# Patient Record
Sex: Male | Born: 1955 | Race: Black or African American | Hispanic: No | Marital: Married | Smoking: Never smoker
Health system: Southern US, Community
[De-identification: ages and names within clinical notes are randomized; demographics above are authoritative.]

## PROBLEM LIST (undated history)

## (undated) DIAGNOSIS — E785 Hyperlipidemia, unspecified: Secondary | ICD-10-CM

## (undated) DIAGNOSIS — R931 Abnormal findings on diagnostic imaging of heart and coronary circulation: Secondary | ICD-10-CM

## (undated) DIAGNOSIS — R945 Abnormal results of liver function studies: Secondary | ICD-10-CM

## (undated) DIAGNOSIS — I10 Essential (primary) hypertension: Secondary | ICD-10-CM

## (undated) DIAGNOSIS — R7989 Other specified abnormal findings of blood chemistry: Secondary | ICD-10-CM

## (undated) DIAGNOSIS — E669 Obesity, unspecified: Secondary | ICD-10-CM

## (undated) DIAGNOSIS — I480 Paroxysmal atrial fibrillation: Secondary | ICD-10-CM

## (undated) DIAGNOSIS — R251 Tremor, unspecified: Secondary | ICD-10-CM

## (undated) DIAGNOSIS — I2510 Atherosclerotic heart disease of native coronary artery without angina pectoris: Secondary | ICD-10-CM

## (undated) DIAGNOSIS — R2689 Other abnormalities of gait and mobility: Secondary | ICD-10-CM

## (undated) DIAGNOSIS — R6889 Other general symptoms and signs: Secondary | ICD-10-CM

## (undated) DIAGNOSIS — R0789 Other chest pain: Secondary | ICD-10-CM

## (undated) DIAGNOSIS — G4733 Obstructive sleep apnea (adult) (pediatric): Secondary | ICD-10-CM

## (undated) HISTORY — DX: Other general symptoms and signs: R68.89

## (undated) HISTORY — DX: Hyperlipidemia, unspecified: E78.5

## (undated) HISTORY — DX: Tremor, unspecified: R25.1

## (undated) HISTORY — DX: Other chest pain: R07.89

## (undated) HISTORY — DX: Other specified abnormal findings of blood chemistry: R79.89

## (undated) HISTORY — DX: Essential (primary) hypertension: I10

## (undated) HISTORY — DX: Other abnormalities of gait and mobility: R26.89

## (undated) HISTORY — DX: Abnormal results of liver function studies: R94.5

## (undated) HISTORY — DX: Obstructive sleep apnea (adult) (pediatric): G47.33

## (undated) HISTORY — DX: Abnormal findings on diagnostic imaging of heart and coronary circulation: R93.1

## (undated) HISTORY — DX: Atherosclerotic heart disease of native coronary artery without angina pectoris: I25.10

## (undated) HISTORY — DX: Paroxysmal atrial fibrillation: I48.0

## (undated) HISTORY — DX: Obesity, unspecified: E66.9

---

## 1999-05-04 ENCOUNTER — Ambulatory Visit (HOSPITAL_COMMUNITY): Admission: RE | Admit: 1999-05-04 | Discharge: 1999-05-04 | Payer: Self-pay | Admitting: Family Medicine

## 2003-12-20 ENCOUNTER — Encounter: Admission: RE | Admit: 2003-12-20 | Discharge: 2003-12-20 | Payer: Self-pay | Admitting: Family Medicine

## 2004-11-12 DIAGNOSIS — R931 Abnormal findings on diagnostic imaging of heart and coronary circulation: Secondary | ICD-10-CM

## 2004-11-12 HISTORY — DX: Abnormal findings on diagnostic imaging of heart and coronary circulation: R93.1

## 2005-02-13 ENCOUNTER — Ambulatory Visit (HOSPITAL_COMMUNITY): Admission: RE | Admit: 2005-02-13 | Discharge: 2005-02-13 | Payer: Self-pay | Admitting: Internal Medicine

## 2005-02-14 ENCOUNTER — Ambulatory Visit: Payer: Self-pay | Admitting: Cardiology

## 2005-02-22 ENCOUNTER — Ambulatory Visit: Payer: Self-pay

## 2005-03-05 ENCOUNTER — Ambulatory Visit: Payer: Self-pay | Admitting: Cardiology

## 2005-04-06 ENCOUNTER — Ambulatory Visit: Payer: Self-pay | Admitting: Cardiology

## 2005-04-12 ENCOUNTER — Ambulatory Visit: Payer: Self-pay

## 2005-05-07 ENCOUNTER — Encounter: Admission: RE | Admit: 2005-05-07 | Discharge: 2005-05-07 | Payer: Self-pay | Admitting: Internal Medicine

## 2005-07-19 ENCOUNTER — Ambulatory Visit: Payer: Self-pay | Admitting: Cardiology

## 2005-10-12 ENCOUNTER — Ambulatory Visit: Payer: Self-pay | Admitting: Cardiovascular Disease

## 2005-10-16 ENCOUNTER — Ambulatory Visit: Payer: Self-pay | Admitting: Cardiovascular Disease

## 2005-10-16 ENCOUNTER — Ambulatory Visit (HOSPITAL_COMMUNITY): Admission: RE | Admit: 2005-10-16 | Discharge: 2005-10-16 | Payer: Self-pay | Admitting: Cardiovascular Disease

## 2005-10-26 ENCOUNTER — Ambulatory Visit: Payer: Self-pay | Admitting: Cardiology

## 2007-11-13 HISTORY — PX: ROTATOR CUFF REPAIR: SHX139

## 2008-03-01 ENCOUNTER — Ambulatory Visit: Payer: Self-pay | Admitting: Gastroenterology

## 2008-03-12 ENCOUNTER — Ambulatory Visit: Payer: Self-pay | Admitting: Gastroenterology

## 2008-06-15 ENCOUNTER — Inpatient Hospital Stay (HOSPITAL_COMMUNITY): Admission: EM | Admit: 2008-06-15 | Discharge: 2008-06-17 | Payer: Self-pay | Admitting: Pulmonary Disease

## 2008-06-15 ENCOUNTER — Encounter: Payer: Self-pay | Admitting: Orthopedic Surgery

## 2008-06-15 ENCOUNTER — Ambulatory Visit: Payer: Self-pay | Admitting: Pulmonary Disease

## 2010-12-03 ENCOUNTER — Encounter: Payer: Self-pay | Admitting: Internal Medicine

## 2011-03-27 NOTE — Op Note (Signed)
NAME:  FRANKIE, SCIPIO                  ACCOUNT NO.:  [id number]   MEDICAL RECORD NO.:  [id number]          PATIENT TYPE:  AMB   LOCATION:  DSC                          FACILITY:  [facility]   PHYSICIAN:  Katy Fitch. Sypher, M.D. DATE OF BIRTH:  1955-12-11   DATE OF PROCEDURE:  06/15/2008  DATE OF DISCHARGE:                               OPERATIVE REPORT   PREOPERATIVE DIAGNOSES:  Chronic stage III impingement right shoulder  with large retracted rotator cuff tear involving the entire  supraspinatus, infraspinatus tendons noted on preoperative MRI as well  as biceps tendinopathy and very unfavorable acromioclavicular anatomy at  the acromioclavicular joint and anterior acromion.   POSTOPERATIVE DIAGNOSES:  Chronic stage III impingement right shoulder  with large retracted rotator cuff tear involving the entire  supraspinatus, infraspinatus tendons noted on preoperative MRI as well  as biceps tendinopathy and very unfavorable acromioclavicular anatomy at  the acromioclavicular joint and anterior acromion, confirming a 30%  biceps degenerative tear, very unfavorable acromial and  acromioclavicular anatomy with a large inferior projecting osteophyte at  the distal clavicle and medial acromion, a retracted rotator cuff tear  involving the entire supraspinatus, infraspinatus tendons, and minor  labral degenerative changes.   OPERATION:  1. Diagnostic arthroscopy, right glenohumeral joint.  2. Arthroscopic debridement of labrum 30% biceps tendon degenerative      tear.  3. Arthroscopic subacromial decompression with bursectomy,      coracoacromial ligament debridement, and anterior acromioplasty.  4. Arthroscopic distal clavicle resection.  5. Arthroscopic repair of 2 tendon rotator cuff tear with placement of      a Bio-Corkscrew anchor anteriorly with 2 mattress sutures just      posterior to the biceps tendon and a speed bridge repairing the      supraspinatus and infraspinatus utilizing a  total of 4 swivel      locks.   OPERATIONS:  Katy Fitch. Sypher, MD   ASSISTANT:  Marveen Reeks. Dasnoit, PA-C.   ANESTHESIA:  General by endotracheal technique supplemented by right  interscalene block.   SUPERVISING ANESTHESIOLOGIST:  Bedelia Person, MD   INDICATIONS:  Raymundo Rout is a 55 year old gentleman employed at [employer] as a Geophysicist/field seismologist.  His work involves repetitively handling  very large and heavy packages.   He presented for evaluation of a painful right shoulder upon referral by  his primary care physician, Dr. Elmore Guise.   Mr. Ohlin was noted to have background medical problems including a  history of hypertension, type 2 diabetes, and had recently been  evaluated by Dr. Eden Emms of [facility] for possible coronary  artery disease.   He had undergone a coronary artery angiogram performed by Dr. Eden Emms on  October 16, 2005.  At that time, he was noted to have a normal ejection  fraction of 80%, a right coronary artery dominant vascular system, and a  normal left main coronary artery.  He subsequently has seen Dr. Chilton Si  for preoperative screening and was advised as a reasonable candidate to  consider reconstruction of his rotator cuff.   Preoperatively, he was interviewed  by Dr. Gypsy Balsam and had a screening EKG  that was interpreted to be normal without evidence of left ventricular  hypertrophy and lab studies that were normal with the exception of an  elevated blood glucose.  His cutaneous blood glucose on the morning of  surgery was 235.  His hemoglobin was 14.7.   After detailed informed consent, he was brought to the operating room at  this time anticipating arthroscopic evaluation of his glenohumeral  joint, appropriate debridement, subacromial decompression, distal  clavicle resection either open or arthroscopic repair of the rotator  cuff.  Preoperatively questions invited and answered in detail.   He was advised that he would be out of work a minimum of  6 months as a  Geophysicist/field seismologist and likely as long as 9 months due to the highly repetitive and  very vigorous nature of his job.  He understands he will be in a sling  for minimum of 6 weeks performing very limited range of motion exercises  initially followed by full range of motion exercises without load-  bearing and will require strength training from month 3 through month 9.   After informed consent, he was brought to the operating room at this  time.   PROCEDURE:  Isacc Turney was brought to the operating room and placed in  supine position on the operating table.   Dr. Gypsy Balsam provided an anesthesia consult in the holding area and placed  an interscalene block without complication.  Mr. Holsomback was brought to  room #6, placed in supine position upon the operating table, and under  Dr. Burnett Corrente supervision general endotracheal anesthesia induced.  He was  carefully positioned in the beach-chair position where they have a torso  and head holder designed for shoulder arthroscopy.   Mr. Panther is a very large and muscular man 6-feet tall weighing to 265  pounds.  His weight is primarily muscle.   His right upper extremity and forequarter were prepped with DuraPrep and  draped with impervious arthroscopy drapes.  Landmarks were identified  followed by placing of the scope with an anterior approach, placing a  switching stick through a standard posterior portal.   Diagnostic arthroscopy of the glenohumeral joint revealed minor  degenerative change of the labrum and a 30% tear of the biceps at the  rotator interval.  The subscapularis was intact.  The supraspinatus,  infraspinatus tendons were completely released from the greater  tuberosity and a large osteophyte had formed at the greater tuberosity.  The teres minor was intact.   A lateral portal was created and a suction shaver was used to debride  the biceps and the soft tissues at the greater tuberosity.  An anterior  portal was created  and used to complete the biceps debridement and  labral debridement.   The greater tuberosity was decorticated with a suction shaver brought in  laterally and care was taken to remove the osteophyte that had grown  from chronic impingement.  After completion of the preparation of the  greater tuberosity, we then removed the scope from glenohumeral joint  and placed the scope in the subacromial space.  There was florid  bursitis.  After extensive bursectomy, the coracoacromial ligament was  released with a cutting cautery followed by leveling the acromion to a  type 1 morphology.  The distal 15 mm clavicle was removed  arthroscopically.  We then performed a rather thorough anterior lateral  and posterior bursectomy to facilitate arthroscopic repair of the cuff.   We  were able to reduce the cuff to the anatomic footprint.  Therefore,  we elected to proceed with placement of 3 Bio-Corkscrew anchors, one  just behind the biceps with two #2 FiberWire sutures and two swivel  locks at the medial footprint of the supraspinatus and infraspinatus to  accomplish a speed bridge type repair.   With a moderate degree of difficulty due to Mr. Hillyard large and  muscular frame, a series of cannulated portals were placed followed by  routine arthroscopic technique placement of two mattress sutures in the  anterior supraspinatus, leaving one set of sutures for later over-the-  top technique with a swivel lock and placement of mattress sutures of  fiber tape through the supraspinatus, infraspinatus approximately 12 to  14 mm from the margin of the tendon.   With the abduction of the shoulder, we were able to then tension these.   Due to some swelling due to saline infiltration during the procedure, we  began to reach the limit of our arthroscopic portals, rendering suture  passage a bit more challenging.   Ultimately, we used the cautery to clear bursa for the lateral cortex  and we began the  process of placing swivel locks laterally to complete  the repair.   At this point, we encountered technical difficulty where bursa had  entrapped one of the swivel lock fiber tape sutures and had knotted.   After about 30 minutes of effort trying to unravel this foul, I elected  to proceed with a muscle-splitting incision to clear the fouled sutures  and completed the repair with two lateral swivel locks with speed bridge  technique using near-far far-near suture technique.   The anterior mattress suture was subsequently tied to the anchor suture  of the anterior swivel lock creating an over-the-top insetting repair of  the supraspinatus.   An anatomic repair was achieved with a very satisfactory profile.   After hemostasis was achieved, the muscle split was repaired with simple  suture of 0 Vicryl and a corner suture of #2 FiberWire.  There were no  apparent complications other than our predicament of a fouled suture.   The wound was repaired with subdermal sutures of 0 Vicryl and  intradermal 3-0 Prolene with Steri-Strips.  The portals repaired with 3-  0 Prolene.  Mr. Staiger was awakened from general anesthesia and  transferred to recovery room in stable signs.   We will monitor his O2 saturation and encourage immediate ambulation in  a sling.   We anticipate discharge in 24 hours.   Should he show any signs of water retention due to a prolonged  arthroscopic procedure, we will consider use of a diuretic.     Katy Fitch Sypher, M.D.  Electronically Signed    RVS/MEDQ  D:  06/15/2008  T:  06/16/2008  Job:  [id number]   cc:   Erskine Speed, M.D.

## 2011-03-30 NOTE — Discharge Summary (Signed)
NAME:  Edwin Gallagher, Edwin Gallagher                  ACCOUNT NO.:  [id number]   MEDICAL RECORD NO.:  [id number]          PATIENT TYPE:  INP   LOCATION:  5009                         FACILITY:  [facility]   PHYSICIAN:  Katy Fitch. Sypher, M.D. DATE OF BIRTH:  03-Feb-1956   DATE OF ADMISSION:  06/15/2008  DATE OF DISCHARGE:  06/17/2008                               DISCHARGE SUMMARY   ADMISSION DIAGNOSES:  1. Status post right shoulder arthroscopy with subacromial      decompression, distal clavicle resection, and rotator cuff repair.  2. Significant postoperative atelectasis.  3. Phrenic nerve block.  4. Diabetes.  5. Benign prostatic hypertrophy.   DISCHARGE DIAGNOSES:  1. Status post right shoulder arthroscopy with subacromial      decompression, distal clavicle resection, and rotator cuff repair.  2. Significant postoperative atelectasis.  3. Phrenic nerve block.  4. Diabetes.  5. Benign prostatic hypertrophy.   OPERATION PERFORMED:  Right shoulder arthroscopy with subacromial  decompression, distal clavicle resection, and lengthy extended rotator  cuff reconstruction.   CONSULTATION:  Coralyn Helling, MD   HISTORY:  The patient is a 55 year old male who is status post prolonged  anesthesia greater than 3 hours for right shoulder arthroscopy for  significant rotator cuff tear.  He underwent reconstruction procedure on  June 15, 2008.  Preoperatively, he had a right shoulder interscalene  block.  At the time of extubation following this procedure, he had  mucous expectorant coughed up, and this was suctioned.  His O2 sat was  88% on 2 L mask.  He was then placed on a rebreather at 100%, and this  increased to 93-95%.  Narcan was provided, and his O2 sat increased to  between 99% and 100% on 100% O2.  Chest auscultation revealed normal  left fields.  He had decreasing air movements on the right lung fields  especially posteroinferior.  We obtained a chest x-ray, AP in a seated  position.  This revealed  a high right hemidiaphragm with significant  atelectasis of the right lower lung fields.  This was a combination of  both position and his phrenic nerve block.  Dr. Karin Golden, the radiologist,  read the chest x-ray, could not rule out aspiration on single image.  Dr. Teressa Senter conferred with Dr. Judie Petit from the Anesthesia  Department.  Consensus was that they felt that he needs to be monitored  in a ICU/critical care unit.  This was to observe his O2 sats and  pulmonary status.  [facility] was contacted, and the patient was decided to  be transferred from [facility] over to [facility].  Orders for transfer written, and Mr.  Lesiak was more arousable and oriented.  He was given 10 mg of IV Lasix  and had voided approximately 1000 mL.  The patient was seen by Dr.  Coralyn Helling.  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  He felt that it would be reversible  within the next 24 hours.  The patient was started on  BiPAP, and a  followup chest x-ray was ordered.  Later that evening, the patient was  alert with his BiPAP.  O2 sats were 100% on 100% O2.  Respiratory  distress had substantially decreased.  There was no shoulder pain as his  block was still in effect.  The following morning of June 16, 2008, the  patient was sleeping comfortably with his BiPAP mask.  O2 had been  weaned to 40%, and his O2 sats were maintained at approximately 96%.  The patient was voiding spontaneously.  His potassium at this time was  3.4, magnesium 1.6, and hemoglobin of 12.5.  On exam, he had slightly  decreased breath sounds on the right side.  Chest x-ray portable in his  room showed no pneumothorax.  His right diaphragm remained up with some  atelectasis.  He was taking sips of water without difficulty.  At this  time, he showed significant interval improvement.  Critical care  consultants continued to wean  the BiPAP.  His BiPAP was then changed to  p.r.n.  The patient was then started on modified carbohydrate diet for  his type 2 diabetes.  Later on the evening of June 16, 2008, he became  more alert.  His O2 was changed to nasal cannula, and his O2 sats  remained in the high 90s.  There was no respiratory distress.  He was  eating without difficulty.  He was moving air much better on the right  side with normal breath sounds with a few crackles.  The patient was out  of bed without difficulty.  On June 17, 2008, his second day postop, we  felt that his right hemidiaphragm paralysis and subsequent respiratory  distress were resolving, and the patient was much improved overall with  improving right lower lobe aeration.  His atelectasis had near  completely resolved on his chest x-ray.  It was felt that the patient  was ready for transfer to ortho floor.  The patient continued to improve  throughout the day, and late in the afternoon of June 17, 2008, it was  felt that he was ready for discharge, as he was alert with stable vital  signs.  He was afebrile.  He had no distress and had maintained his O2  sats on room air.  His chest x-ray revealed his right hemidiaphragm was  now normal with minimal atelectasis.   ASSESSMENT:  He was now stable postoperatively following right shoulder  reconstruction with preoperative block and subsequent right  hemidiaphragm paresis.   MEDICATIONS:  The patient will continue his home meds as usual.  He is  given prescriptions for:  1. Keflex 500 mg, #12, 1 p.o. q.8 h. until gone.  2. Dilaudid 2 mg 1 or 2 p.o. q.4-6 h. p.r.n. pain.  3. Motrin 600 mg 1 p.o. q.6 h. p.r.n. pain to be taken with food.   DIET:  He will remain on his diabetic-restricted carbohydrate diet.  He  will add dietary potassium.   WOUND CARE:  He will keep his dressings dry.   ACTIVITY:  He is instructed on pendulum exercises per protocol.  He will  wear his sling full time.    RETURN APPOINTMENT:  He will return to our office in 2 days for recheck  or sooner should he have any problems.   CONDITION ON DISCHARGE:  Stable and improving.   FINAL DIAGNOSES:  Status post right shoulder rotator cuff reconstruction  with preoperative shoulder block and subsequent respiratory distress due  to right hemidiaphragm paresis and development of respiratory distress  and atelectasis, requiring transfer to Critical Care at [facility].      Jonni Sanger, P.A.      Katy Fitch Sypher, M.D.  Electronically Signed    RJD/MEDQ  D:  08/19/2008  T:  08/19/2008  Job:  [id number]

## 2011-05-03 ENCOUNTER — Encounter: Payer: Self-pay | Admitting: Cardiology

## 2011-05-03 ENCOUNTER — Ambulatory Visit (INDEPENDENT_AMBULATORY_CARE_PROVIDER_SITE_OTHER): Payer: BC Managed Care – PPO | Admitting: Cardiology

## 2011-05-03 DIAGNOSIS — E669 Obesity, unspecified: Secondary | ICD-10-CM

## 2011-05-03 DIAGNOSIS — E785 Hyperlipidemia, unspecified: Secondary | ICD-10-CM

## 2011-05-03 DIAGNOSIS — I10 Essential (primary) hypertension: Secondary | ICD-10-CM

## 2011-05-03 DIAGNOSIS — R079 Chest pain, unspecified: Secondary | ICD-10-CM

## 2011-05-03 DIAGNOSIS — R072 Precordial pain: Secondary | ICD-10-CM

## 2011-05-03 NOTE — Patient Instructions (Signed)
Your physician recommends that you schedule a follow-up appointment in:2-3 weeks with Dr. Riley Kill. Your physician has requested that you have a stress echocardiogram. For further information please visit [email or web address] Please follow instruction sheet as given. A chest x-ray takes a picture of the organs and structures inside the chest, including the heart, lungs, and blood vessels. This test can show several things, including, whether the heart is enlarges; whether fluid is building up in the lungs; and whether pacemaker / defibrillator leads are still in place. To be done at our [facility]. Office across from [facility].

## 2011-05-08 ENCOUNTER — Ambulatory Visit (INDEPENDENT_AMBULATORY_CARE_PROVIDER_SITE_OTHER)
Admission: RE | Admit: 2011-05-08 | Discharge: 2011-05-08 | Disposition: A | Discharge status: Home / Self Care | Payer: BC Managed Care – PPO | Source: Ambulatory Visit | Attending: Cardiology | Admitting: Cardiology

## 2011-05-08 DIAGNOSIS — R072 Precordial pain: Secondary | ICD-10-CM

## 2011-05-10 ENCOUNTER — Ambulatory Visit (HOSPITAL_BASED_OUTPATIENT_CLINIC_OR_DEPARTMENT_OTHER): Payer: BC Managed Care – PPO | Admitting: Radiology

## 2011-05-10 ENCOUNTER — Ambulatory Visit (HOSPITAL_COMMUNITY): Payer: BC Managed Care – PPO | Attending: Cardiology

## 2011-05-10 DIAGNOSIS — R0989 Other specified symptoms and signs involving the circulatory and respiratory systems: Secondary | ICD-10-CM

## 2011-05-10 DIAGNOSIS — R072 Precordial pain: Secondary | ICD-10-CM

## 2011-05-10 DIAGNOSIS — I10 Essential (primary) hypertension: Secondary | ICD-10-CM | POA: Insufficient documentation

## 2011-05-11 ENCOUNTER — Encounter: Payer: Self-pay | Admitting: Cardiology

## 2011-05-11 DIAGNOSIS — E785 Hyperlipidemia, unspecified: Secondary | ICD-10-CM | POA: Insufficient documentation

## 2011-05-11 DIAGNOSIS — E669 Obesity, unspecified: Secondary | ICD-10-CM | POA: Insufficient documentation

## 2011-05-11 DIAGNOSIS — I10 Essential (primary) hypertension: Secondary | ICD-10-CM | POA: Insufficient documentation

## 2011-05-11 DIAGNOSIS — R079 Chest pain, unspecified: Secondary | ICD-10-CM | POA: Insufficient documentation

## 2011-05-11 NOTE — Assessment & Plan Note (Signed)
Patient has had some intermittent chest pain of uncertain etiology.  He has had long standing hypertension, and had a CT scan in 2006 with some soft plaque, but negative calcium score.  We may need a low threshold for perform definitive cath in him, but would suggest getting stress echo first, seeing those results, and getting a CXR to exclude other sources.  There are some typical but mostly atypical features of his symptoms.  It does impact his work.  We will proceed in the next week with getting these done.  There are no acute ECG changes at present.

## 2011-05-11 NOTE — Assessment & Plan Note (Signed)
On a multidrug regimen prescribed by Dr. Chilton Si.  Appears to have pretty good control.  Major factor is weight, perhaps OSA.  Consideration of OSA evaluation might be worthwhile.

## 2011-05-11 NOTE — Progress Notes (Signed)
HPI:  [name] is seen as an add on from Dr. Thomasene Lot office.  He was seen by Dr. Chilton Si on June 7, with a complaint of chest pain which had occurred over a two week period, with three episodes, seemingly worse when he layed down.  He had had an abnormal stress test in 2006, and ultimately underwent a cardiac CT scan which was felt not to show a significant issue at that time.  Results are noted.  It seems to occur mostly at work.  He will sweat at times, but not necessarily related to these episodes.  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  The pain will last up to 10-20 minutes, sometimes with radiation to arm.  His adenosine study in 2006 was without ischemia, but lower EF.  Echo showed moderate concentric hypertrophy.   His Cardiac CT of October 16, 2005 is read as:   Findings:  The patient's calcium score was 0.  There was no evidence   of calcific plaque anywhere in the coronary arteries.   The patient had normal left ventricular cavity size.  There was   moderate basal septal hypertrophy with a septal thickness of about 15   mm.  Ejection fraction was normal at 80%, and there were no regional   wall motion abnormalities.   The coronary arteries arose normally from the respective sinuses.   The patient was right coronary artery dominant.   The left main coronary artery was normal.   The left anterior descending artery was normal in its proximal   portion.   LAD had a somewhat eccentric soft plaque with a less than 50%   stenosis.  This occurred after the takeoff of a medium-size first   diagonal branch.  The diagonal branch was normal.  The distal LAD was   normal.  Care was taken to see if there was any bridging in the area   of the mid LAD lesion, and there was not.  However, this did not   appear to be a flow-limiting lesion.   Circumflex coronary artery was small and consisted primarily of 1 AV   groove or OM branch and was  normal.   The right coronary artery was large and dominant and was somewhat   tortuous, as is sometimes seen in hypertensive patients.  There is a   single PDA and posterolateral branch.  There is no significant   disease in the right coronary artery.   There is an accessory right pulmonary vein to the superior segment   right lower lobe, seen on image 15 of series 10.   View of the patient's bone, mediastinal, and lung windows showed no   significant lesions.  The noncardiac findings will be also   re-reviewed by Dr. Jeronimo Greaves from [facility].   IMPRESSION:   1.  Calcium score equal to 0.   2.  Normal LV function with no wall motion abnormality.  EF 80%.   Mild to moderate basal septal hypertrophy with a septal thickness of   15 mm in diastole.   3.  Normal left main, right coronary, and circumflex coronary   arteries.  One area of less than 50% soft plaque in the mid LAD.   4.  Normal trileaflet aortic valve without significant lesions in the   ascending aorta.   5.  Normal left pulmonary veins with an accessory right sided vein   supplying the superoir  segment, right lower lobe.   6.  No left atrial appendage thrombus.   7.  Normal pericardium with no evidence of pericardial effusion.   8.  Overall, this would be considered low-risk cardiac CTA.   This study should be billed by [facility] as 0147 T-code plus   051 T-code, done in conjunction with Dr. Jeronimo Greaves from [facility]   [facility].    Read By:  Consuello Bossier,  M.D.  Wendall Stade,  M.D.   Current Outpatient Prescriptions  Medication Sig Dispense Refill  . amLODipine (NORVASC) 10 MG tablet Take 10 mg by mouth daily.        Marland Kitchen aspirin 81 MG tablet Take 81 mg by mouth daily.        Marland Kitchen glimepiride (AMARYL) 4 MG tablet Take 4 mg by mouth 2 (two) times daily.        Marland Kitchen losartan-hydrochlorothiazide (HYZAAR) 100-25 MG per tablet Take 1 tablet by mouth daily.        . metFORMIN (GLUCOPHAGE) 1000 MG tablet  Take 1,000 mg by mouth. 1 in AM and 1/2 PM       . metoprolol (TOPROL-XL) 100 MG 24 hr tablet Take 100 mg by mouth daily.        . simvastatin (ZOCOR) 20 MG tablet Take 20 mg by mouth at bedtime.          No Known Allergies  Past Medical History  Diagnosis Date  . Chest discomfort   . Hypertension   . Abnormal LFTs   . Diabetes mellitus   . Hyperlipidemia     Past Surgical History  Procedure Date  . Rotator cuff repair     No family history on file.  History   Social History  . Marital Status: Married    Spouse Name: N/A    Number of Children: N/A  . Years of Education: N/A   Occupational History  . Not on file.   Social History Main Topics  . Smoking status: Former Smoker    Quit date: 11/12/1970  . Smokeless tobacco: Not on file  . Alcohol Use: No  . Drug Use: No  . Sexually Active: Not on file   Other Topics Concern  . Not on file   Social History Narrative  . No narrative on file    ROS: Please see the HPI.  All other systems reviewed and negative.  PHYSICAL EXAM:  BP 140/78  Pulse 83  Resp 18  Ht 6' (1.829 m)  Wt 279 lb 1.9 oz (126.608 kg)  BMI 37.86 kg/m2  General: Well developed, well nourished, in no acute distress. Head:  Normocephalic and atraumatic. Neck: no JVD Lungs: Clear to auscultation and percussion. Heart: Normal S1 and S2.  No murmur, rubs.  Pos S4 gallop Abdomen:  Normal bowel sounds; soft; non tender; no organomegaly Pulses: Pulses normal in all 4 extremities. Extremities: No clubbing or cyanosis. No edema. Neurologic: Alert and oriented x 3.  EKG:NSR.  No acute changes.  Borderline QTc prolongation. ASSESSMENT AND PLAN:

## 2011-05-11 NOTE — Assessment & Plan Note (Signed)
Managed by Dr. Chilton Si and on a statin at present.  Would continue with primary care follow up.

## 2011-05-28 ENCOUNTER — Telehealth: Payer: Self-pay | Admitting: Cardiology

## 2011-05-28 NOTE — Telephone Encounter (Signed)
Pt calling wanting to talk someone to make appt with Dr. Riley Kill sooner. Pt said Dr. Riley Kill called pt last week personally and wanted pt to come in sooner than pt July 25th appt. Please return pt call and advise.  Pt is experiencing tightness and discomfort in chest. Pt said syptoms have slightly subsided but would still like an appt ASAP.  Pt said he called last week asking to set up an appt at an earlier date.

## 2011-05-28 NOTE — Telephone Encounter (Signed)
Called and spoke with patient  He says the majority of his pain is while up and moving around.  He has at times at night be unable to get comfortable but would not describe that as pain.  Discussed with Dr Riley Kill and will have patient come in on Thurs at 11:00 am to see him to discuss

## 2011-05-28 NOTE — Telephone Encounter (Signed)
Will discuss with Dr Riley Kill and call pt back.

## 2011-05-31 ENCOUNTER — Encounter: Payer: Self-pay | Admitting: Cardiology

## 2011-05-31 ENCOUNTER — Ambulatory Visit (INDEPENDENT_AMBULATORY_CARE_PROVIDER_SITE_OTHER): Payer: BC Managed Care – PPO | Admitting: Cardiology

## 2011-05-31 DIAGNOSIS — E785 Hyperlipidemia, unspecified: Secondary | ICD-10-CM

## 2011-05-31 DIAGNOSIS — R079 Chest pain, unspecified: Secondary | ICD-10-CM

## 2011-05-31 DIAGNOSIS — I10 Essential (primary) hypertension: Secondary | ICD-10-CM

## 2011-05-31 DIAGNOSIS — E669 Obesity, unspecified: Secondary | ICD-10-CM

## 2011-06-06 ENCOUNTER — Ambulatory Visit: Payer: BC Managed Care – PPO | Admitting: Cardiology

## 2011-06-12 ENCOUNTER — Telehealth: Payer: Self-pay | Admitting: Cardiology

## 2011-06-12 NOTE — Telephone Encounter (Signed)
Dr. Thomasene Lot office wanted to inform us to fax Dr. Rosalyn Charters last office visit note on Edwin Gallagher to their office as soon as he signs it. Advised her I would have him sign it and fax it to them.

## 2011-06-12 NOTE — Telephone Encounter (Signed)
Primary care is waiting on test result. Who should arrange set up for sleep apena. . Dr. Nila Nephew [phone].

## 2011-07-03 NOTE — Assessment & Plan Note (Signed)
Monitored by Dr. Chilton Si in internal medicine office.   On simva.  Appropriate for DM.

## 2011-07-03 NOTE — Progress Notes (Signed)
HPI:  I met with Edwin Gallagher and his wife and we spent nearly an hour discussing his overall situation.  I reviewed with the two of them possible options with regard to his work up, how he feels now, the results of the studies that have been done, and other approaches that we could take.  He actually physically is a bit better, and not with the same back pain and discomfort, which was precipitate by work, that he was having.  He has multiple cardiac risk factors, and we reviewed in detail the risk, benefits, and approaches that we could use in cardiac cath.  He deferred and declined at the present time.  As I told him, the reason to proceed was only to establish certainty and allay their fears, but that based on the current studies it was not inappropriate to take a path of watchful waiting.    Current Outpatient Prescriptions  Medication Sig Dispense Refill  . amLODipine (NORVASC) 10 MG tablet Take 10 mg by mouth daily.        Marland Kitchen aspirin 81 MG tablet Take 81 mg by mouth daily.        Marland Kitchen glimepiride (AMARYL) 4 MG tablet Take 4 mg by mouth 2 (two) times daily.        Marland Kitchen losartan-hydrochlorothiazide (HYZAAR) 100-25 MG per tablet Take 1 tablet by mouth daily.        . metFORMIN (GLUCOPHAGE) 1000 MG tablet Take 1,000 mg by mouth. 1 in AM and 1/2 PM       . metoprolol (TOPROL-XL) 100 MG 24 hr tablet Take 100 mg by mouth daily.        . simvastatin (ZOCOR) 20 MG tablet Take 20 mg by mouth at bedtime.          No Known Allergies  Past Medical History  Diagnosis Date  . Chest discomfort   . Hypertension   . Abnormal LFTs   . Diabetes mellitus   . Hyperlipidemia     Past Surgical History  Procedure Date  . Rotator cuff repair     Family History  Problem Relation Age of Onset  . Hypertension Mother   . Alcohol abuse Brother   . Hypertension Father     History   Social History  . Marital Status: Married    Spouse Name: N/A    Number of Children: N/A  . Years of Education: N/A    Occupational History  . Not on file.   Social History Main Topics  . Smoking status: Former Smoker    Quit date: 11/12/1970  . Smokeless tobacco: Not on file  . Alcohol Use: No  . Drug Use: No  . Sexually Active: Not on file   Other Topics Concern  . Not on file   Social History Narrative  . No narrative on file    ROS: Please see the HPI.  All other systems reviewed and negative.  PHYSICAL EXAM:  BP 122/81  Pulse 81  Resp 18  Ht 6' (1.829 m)  Wt 279 lb (126.554 kg)  BMI 37.84 kg/m2  General: Well developed, well nourished, in no acute distress. Head:  Normocephalic and atraumatic. Neck: no JVD Lungs: Clear to auscultation and percussion. Heart: Normal S1 and S2.  No murmur, rubs or gallops.  Pulses: Pulses normal in all 4 extremities. Extremities: No clubbing or cyanosis. No edema. Neurologic: Alert and oriented x 3.  EKG:  CXR:  05/29/2011*RADIOLOGY REPORT*  Clinical Data: Chest pain. Diabetes. Former  smoker.  CHEST - 2 VIEW  Comparison: 06/17/2008  Findings: Heart size is normal. Both lungs are clear. No evidence of pleural effusion. No mass or adenopathy identified.  IMPRESSION: No active cardiopulmonary disease.  Original Report Authenticated By: Danae Orleans, M.D.  Stress ECHO  (05/10/2011)  Stress results: Maximal heart rate during stress was 155bpm (93% of maximal predicted heart rate). The maximal predicted heart rate was 166bpm. The target heart rate was achieved. The heart rate response to stress was normal. There was resting hypertension with a hypertensive response to stress. The rate-pressure product for the peak heart rate and blood pressure was Hg/min. Stress induced chest pain.  -------------------------------------------------------------------- Stress ECG: The stress ECG was negative for ischemia.  -------------------------------------------------------------------- Baseline:  - The estimated LV ejection fraction  was 70%. - Normal wall motion; no LV regional wall motion abnormalities. Peak stress:  - The estimated LV ejection fraction was 80%. - Normal wall motion; no LV regional wall motion abnormalities.  -------------------------------------------------------------------- Prepared and Electronically Authenticated by  Cassell Clement, MD 2012-06-28T19:03:[id number]     ASSESSMENT AND PLAN:

## 2011-07-03 NOTE — Assessment & Plan Note (Signed)
Reviewed the relationship between obesity, OSA, hypertension, DM, and ultimate CAD with patient.  Clearly pointed out the likelihood of developing over a lifetime.  He might benefit from a sleep study.  Will defer to Dr. Chilton Si.

## 2011-07-03 NOTE — Assessment & Plan Note (Signed)
His BP is well controlled.  Currently on a medical regimen by Dr. Chilton Si.

## 2011-07-03 NOTE — Assessment & Plan Note (Signed)
Has prior CTA noted in 2006.  Current symptoms were somewhat atypical, but he does have multiple risk factors for CAD.  His stress echo was negative, with an appropriate rise in EF with activity, and good exercise tolerance.  CXR does not suggest some intrathoracic issue.  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  Long meeting with patient and wife regarding findings, and possible evaluation.  If symptoms continue, cath may be appropriate, but it seems based on the data that this is not the source. Will return to Dr. Chilton Si for further evaluation.

## 2011-08-02 ENCOUNTER — Ambulatory Visit (INDEPENDENT_AMBULATORY_CARE_PROVIDER_SITE_OTHER): Payer: BC Managed Care – PPO | Admitting: Cardiology

## 2011-08-02 ENCOUNTER — Encounter: Payer: Self-pay | Admitting: Cardiology

## 2011-08-02 DIAGNOSIS — R0683 Snoring: Secondary | ICD-10-CM

## 2011-08-02 DIAGNOSIS — E785 Hyperlipidemia, unspecified: Secondary | ICD-10-CM

## 2011-08-02 DIAGNOSIS — R0989 Other specified symptoms and signs involving the circulatory and respiratory systems: Secondary | ICD-10-CM

## 2011-08-02 DIAGNOSIS — R079 Chest pain, unspecified: Secondary | ICD-10-CM

## 2011-08-02 DIAGNOSIS — R0609 Other forms of dyspnea: Secondary | ICD-10-CM

## 2011-08-02 DIAGNOSIS — I10 Essential (primary) hypertension: Secondary | ICD-10-CM

## 2011-08-02 NOTE — Assessment & Plan Note (Signed)
Managed by Dr. Chilton Si.

## 2011-08-02 NOTE — Assessment & Plan Note (Signed)
Managed by Dr. Green. 

## 2011-08-02 NOTE — Patient Instructions (Signed)
Your physician has recommended that you have a sleep study. This test records several body functions during sleep, including: brain activity, eye movement, oxygen and carbon dioxide blood levels, heart rate and rhythm, breathing rate and rhythm, the flow of air through your mouth and nose, snoring, body muscle movements, and chest and belly movement.  Your physician wants you to follow-up in: 6 MONTHS.  You will receive a reminder letter in the mail two months in advance. If you don't receive a letter, please call our office to schedule the follow-up appointment.  Your physician recommends that you continue on your current medications as directed. Please refer to the Current Medication list given to you today.

## 2011-08-02 NOTE — Assessment & Plan Note (Signed)
Think he likely needs a sleep study.  I discussed with him at length.

## 2011-08-02 NOTE — Progress Notes (Signed)
HPI:  Doing well.  No specific complaints.  No more chest pain.  Has tender left elbow at times from lifting boxes and a bad knee.  Does not sleep well.  He says that Dr. Amanda Cockayne secretary referred him back here for a sleep study.  He dose not sleep well at night.  He has lost a few pounds.  Sugars are "ok".  He does snore. Wife actually wakes him up from time to time.    Current Outpatient Prescriptions  Medication Sig Dispense Refill  . amLODipine (NORVASC) 10 MG tablet Take 10 mg by mouth daily.        Marland Kitchen aspirin 81 MG tablet Take 81 mg by mouth daily.        Marland Kitchen glimepiride (AMARYL) 4 MG tablet Take 4 mg by mouth 2 (two) times daily.        Marland Kitchen losartan-hydrochlorothiazide (HYZAAR) 100-25 MG per tablet Take 1 tablet by mouth daily.        . metFORMIN (GLUCOPHAGE) 1000 MG tablet Take 1,000 mg by mouth. 1 in AM and 1/2 PM       . metoprolol (TOPROL-XL) 100 MG 24 hr tablet Take 100 mg by mouth daily.        . simvastatin (ZOCOR) 20 MG tablet Take 20 mg by mouth at bedtime.          No Known Allergies  Past Medical History  Diagnosis Date  . Chest discomfort   . Hypertension   . Abnormal LFTs   . Diabetes mellitus   . Hyperlipidemia     Past Surgical History  Procedure Date  . Rotator cuff repair     Family History  Problem Relation Age of Onset  . Hypertension Mother   . Alcohol abuse Brother   . Hypertension Father     History   Social History  . Marital Status: Married    Spouse Name: N/A    Number of Children: N/A  . Years of Education: N/A   Occupational History  . Not on file.   Social History Main Topics  . Smoking status: Former Smoker    Quit date: 11/12/1970  . Smokeless tobacco: Not on file  . Alcohol Use: No  . Drug Use: No  . Sexually Active: Not on file   Other Topics Concern  . Not on file   Social History Narrative  . No narrative on file    ROS: Please see the HPI.  All other systems reviewed and negative.  PHYSICAL EXAM:  BP 131/80   Pulse 78  Ht 6' (1.829 m)  Wt 276 lb 12.8 oz (125.556 kg)  BMI 37.54 kg/m2  General: Well developed, well nourished, in no acute distress. Head:  Normocephalic and atraumatic. Neck: no JVD Lungs: Clear to auscultation and percussion. Heart: Normal S1 and S2.  No murmur, rubs or gallops.  Abdomen:  Normal bowel sounds; soft; non tender; no organomegaly Pulses: Pulses normal in all 4 extremities. Extremities: No clubbing or cyanosis. No edema. Neurologic: Alert and oriented x 3.  EKG:  NSR. WNL.   ASSESSMENT AND PLAN:

## 2011-08-02 NOTE — Assessment & Plan Note (Signed)
Suspect symptoms were musculoskeletal.  I did tell him that he has multiple risk factors, and is at risk for cardiac events, but that resolution of symptoms, the quality of symptoms, and work up do not suggest presently that this is the cause.  Observation.

## 2011-08-06 ENCOUNTER — Ambulatory Visit (HOSPITAL_BASED_OUTPATIENT_CLINIC_OR_DEPARTMENT_OTHER): Payer: BC Managed Care – PPO | Attending: Cardiology

## 2011-08-06 DIAGNOSIS — G4733 Obstructive sleep apnea (adult) (pediatric): Secondary | ICD-10-CM | POA: Insufficient documentation

## 2011-08-06 DIAGNOSIS — R0683 Snoring: Secondary | ICD-10-CM

## 2011-08-06 DIAGNOSIS — G4737 Central sleep apnea in conditions classified elsewhere: Secondary | ICD-10-CM | POA: Insufficient documentation

## 2011-08-09 ENCOUNTER — Encounter: Payer: Self-pay | Admitting: *Deleted

## 2011-08-10 LAB — COMPREHENSIVE METABOLIC PANEL
ALT: 76 — ABNORMAL HIGH
AST: 73 — ABNORMAL HIGH
Albumin: 2.9 — ABNORMAL LOW
Alkaline Phosphatase: 113
BUN: 9
CO2: 30
Calcium: 8.2 — ABNORMAL LOW
Chloride: 100
Creatinine, Ser: 1.05
GFR calc Af Amer: >60
GFR calc non Af Amer: >60
Glucose, Bld: 226 — ABNORMAL HIGH
Potassium: 3.4 — ABNORMAL LOW
Sodium: 139
Total Bilirubin: 1
Total Protein: 6.4

## 2011-08-10 LAB — MAGNESIUM: Magnesium: 1.6

## 2011-08-10 LAB — CBC
HCT: 36.8 — ABNORMAL LOW
Hemoglobin: 12.5 — ABNORMAL LOW
MCHC: 34
MCV: 89.4
Platelets: 149 — ABNORMAL LOW
RBC: 4.11 — ABNORMAL LOW
RDW: 14.2
WBC: 8.8

## 2011-08-10 LAB — BASIC METABOLIC PANEL
BUN: 8
CO2: 29
Calcium: 9.2
Chloride: 97
Creatinine, Ser: 1.14
GFR calc Af Amer: >60
GFR calc non Af Amer: >60
Glucose, Bld: 494 — ABNORMAL HIGH
Potassium: 3.6
Sodium: 135

## 2011-08-10 LAB — POCT HEMOGLOBIN-HEMACUE: Hemoglobin: 14.7

## 2011-08-10 LAB — PHOSPHORUS: Phosphorus: 3.3

## 2011-08-11 DIAGNOSIS — G4737 Central sleep apnea in conditions classified elsewhere: Secondary | ICD-10-CM

## 2011-08-11 DIAGNOSIS — G4733 Obstructive sleep apnea (adult) (pediatric): Secondary | ICD-10-CM

## 2011-08-11 NOTE — Procedures (Signed)
NAME:  Edwin Gallagher, Edwin Gallagher NO.:  [id number]  MEDICAL RECORD NO.:  [id number]          PATIENT TYPE:  OUT  LOCATION:  SLEEP CENTER                 FACILITY[facility]  [facility]  PHYSICIAN:  Barbaraann Share, MD,FCCPDATE OF BIRTH:  December 23[birth year] 1956  DATE OF STUDY:  08/06/2011                           NOCTURNAL POLYSOMNOGRAM  REFERRING PHYSICIAN:  Arturo Morton. Riley Kill, MD, [facility]  INDICATION FOR STUDY:  Hypersomnia with sleep apnea.  EPWORTH SLEEPINESS SCORE:  4.  MEDICATIONS:  SLEEP ARCHITECTURE:  The patient had a total sleep time of 267 minutes with very little slow wave sleep or REM.  Sleep onset latency was mildly prolonged at 46 minutes, and REM onset was normal at 101 minutes.  Sleep efficiency was poor at 61% during the diagnostic portion of the study and only 70% during the titration portion of the study.  RESPIRATORY DATA:  The patient underwent a split night protocol where he was found to have 184 obstructive and central events in the first 130 minutes of sleep.  This gave him an apnea/hypopnea index of 85 events per hour during the diagnostic portion of the study.  The events occurred in all body positions, but were much more prevalent in the supine position.  There was loud snoring noted throughout.  By protocol, the patient was then fitted with a large ResMed Mirage Quattro full-face mask, and CPAP titration was initiated.  At a final pressure of 14 cm of water, the patient appeared to have good control of his events, however, only 5 minutes of total sleep time were spent in this setting.  OXYGEN DATA:  There was O2 desaturation as low as 73%.  CARDIAC DATA:  No clinically significant arrhythmias were noted.  MOVEMENT-PARASOMNIA:  The patient had no significant leg jerks or other abnormal behavior seen.  IMPRESSIONS-RECOMMENDATIONS:  Split night study reveals severe obstructive and central sleep apnea with an apnea/hypopnea index of 85 events per hour and O2 desaturation  as low as 73% during the diagnostic portion of the study.  He was then fitted with a large ResMed Mirage Quattro full-face mask, and titrated to a final pressure of 14 cm of water.  There was no time for further titration, and the patient spent very little sleep time at this final setting.  Therefore, it is unclear if this is an optimal pressure for him.  Clinical correlation is suggested.     Barbaraann Share, MD,FCCP Diplomate, American Board of Sleep Medicine Electronically Signed    [facility]  D:  08/11/2011 13:53:41  T:  08/11/2011 14:48:52  Job:  [id number]

## 2011-08-18 ENCOUNTER — Emergency Department (HOSPITAL_COMMUNITY): Payer: BC Managed Care – PPO

## 2011-08-18 ENCOUNTER — Emergency Department (HOSPITAL_COMMUNITY)
Admission: EM | Admit: 2011-08-18 | Discharge: 2011-08-18 | Disposition: A | Discharge status: Home / Self Care | Payer: BC Managed Care – PPO | Attending: Emergency Medicine | Admitting: Emergency Medicine

## 2011-08-18 DIAGNOSIS — M7989 Other specified soft tissue disorders: Secondary | ICD-10-CM | POA: Insufficient documentation

## 2011-08-18 DIAGNOSIS — M79609 Pain in unspecified limb: Secondary | ICD-10-CM | POA: Insufficient documentation

## 2011-08-18 DIAGNOSIS — I10 Essential (primary) hypertension: Secondary | ICD-10-CM | POA: Insufficient documentation

## 2011-08-18 DIAGNOSIS — E119 Type 2 diabetes mellitus without complications: Secondary | ICD-10-CM | POA: Insufficient documentation

## 2011-08-29 ENCOUNTER — Telehealth: Payer: Self-pay | Admitting: Cardiology

## 2011-08-29 DIAGNOSIS — G4733 Obstructive sleep apnea (adult) (pediatric): Secondary | ICD-10-CM

## 2011-08-29 NOTE — Telephone Encounter (Signed)
Edwin Gallagher, just wanted to let you know Edwin Gallagher's sleep study showed severe osa with AHI 85/hr. Let me know if you would like [place] to arrange consult visit.  (staff message to Dr Riley Kill from Dr Shelle Iron)  I spoke with the Edwin Gallagher and made him aware that sleep study showed OSA.  Order placed for referral to Dr Shelle Iron.  First available appt 09/24/11 at 3:30, arrive at 3:15.  Edwin Gallagher aware of appt.  The Edwin Gallagher said he would call pulmonary to see if they have a wait list for earlier appointments. The Edwin Gallagher is also having facial pain that is not relieved by pain medicine and he is going to an Urgent Care for evaluation.

## 2011-08-29 NOTE — Telephone Encounter (Signed)
Pt calling wanting to know results of pt sleep apnea test. Pt also wants to know how to proceed from here, i.e. When and where to get equipment, etc. Please return pt call to discuss further.

## 2011-09-24 ENCOUNTER — Ambulatory Visit (INDEPENDENT_AMBULATORY_CARE_PROVIDER_SITE_OTHER): Payer: BC Managed Care – PPO | Admitting: Pulmonary Disease

## 2011-09-24 ENCOUNTER — Encounter: Payer: Self-pay | Admitting: Pulmonary Disease

## 2011-09-24 VITALS — BP 160/78 | HR 89 | Temp 98.3°F | Ht 72.0 in | Wt 280.0 lb

## 2011-09-24 DIAGNOSIS — G4733 Obstructive sleep apnea (adult) (pediatric): Secondary | ICD-10-CM | POA: Insufficient documentation

## 2011-09-24 NOTE — Assessment & Plan Note (Signed)
The patient has been diagnosed with severe obstructive sleep apnea by a recent sleep study, and will need treatment with CPAP while he is working on weight reduction.  I have had a long discussion with the pt about sleep apnea, including its impact on QOL and CV health.  He is willing to try the CPAP, and I have asked him to call me if he has any tolerance issues. I will set the patient up on cpap at a moderate pressure level to allow for desensitization, and will troubleshoot the device over the next 4-6weeks if needed.  The pt is to call me if having issues with tolerance.  Will then optimize the pressure once patient is able to wear cpap on a consistent basis.

## 2011-09-24 NOTE — Patient Instructions (Signed)
Will start on cpap.  Please call if having tolerance issues. Work on weight loss followup with me in 5 weeks.  

## 2011-09-24 NOTE — Progress Notes (Signed)
  Subjective:    Patient ID: Edwin Gallagher, male    DOB: 12/01/1955, 55 y.o.   MRN: [id number]  HPI The patient is a 55 year old male who I've been asked to see for management of obstructive sleep apnea.  He has recently undergone a sleep study, which showed an AHI of 85 events per hour.  He then underwent CPAP titration with what appears to be an optimal pressure near 14 cm.  The patient states that he does have loud snoring, as well as an abnormal breathing pattern during sleep.  He does not feel that he is adequately rested in the mornings upon arising, but does not believe that he has significant sleepiness during the day.  As long as something holds his interest, he does not feel sleep pressure.  He denies sleepiness while driving.  The patient states that his weight is up 5-10 pounds over the last 2 years, and his Epworth sleepiness score today is only 4.  Sleep Questionnaire: What time do you typically go to bed?( Between what hours) 3:30 to 5:00 am How long does it take you to fall asleep? not long at all How many times during the night do you wake up? 3 What time do you get out of bed to start your day? 1000 Do you drive or operate heavy machinery in your occupation? No How much has your weight changed (up or down) over the past two years? (In pounds) 7 lb (3.175 kg) Have you ever had a sleep study before? No Do you currently use CPAP? No Do you wear oxygen at any time? No    Review of Systems  Constitutional: Negative for fever and unexpected weight change.  HENT: Negative for ear pain, nosebleeds, congestion, sore throat, rhinorrhea, sneezing, trouble swallowing, dental problem, postnasal drip and sinus pressure.   Eyes: Negative for redness and itching.  Respiratory: Positive for shortness of breath. Negative for cough, chest tightness and wheezing.   Cardiovascular: Positive for chest pain. Negative for palpitations and leg swelling.  Gastrointestinal: Negative for nausea and vomiting.    Genitourinary: Negative for dysuria.  Musculoskeletal: Negative for joint swelling.  Skin: Negative for rash.  Neurological: Negative for headaches.  Hematological: Does not bruise/bleed easily.  Psychiatric/Behavioral: Negative for dysphoric mood. The patient is not nervous/anxious.        Objective:   Physical Exam Constitutional:  Overweight male, no acute distress  HENT:  Nares patent without discharge  Oropharynx without exudate, uvula normal, palate thick and elongated.   Eyes:  Perrla, eomi, no scleral icterus  Neck:  No JVD, no TMG  Cardiovascular:  Normal rate, regular rhythm, no rubs or gallops.  No murmurs        Intact distal pulses  Pulmonary :  Normal breath sounds, no stridor or respiratory distress   No rales, rhonchi, or wheezing  Abdominal:  Soft, nondistended, bowel sounds present.  No tenderness noted.   Musculoskeletal:  No lower extremity edema noted.  Lymph Nodes:  No cervical lymphadenopathy noted  Skin:  No cyanosis noted  Neurologic:  Alert, appropriate, moves all 4 extremities without obvious deficit.         Assessment & Plan:

## 2011-10-29 ENCOUNTER — Ambulatory Visit (INDEPENDENT_AMBULATORY_CARE_PROVIDER_SITE_OTHER): Payer: BC Managed Care – PPO | Admitting: Pulmonary Disease

## 2011-10-29 ENCOUNTER — Encounter: Payer: Self-pay | Admitting: Pulmonary Disease

## 2011-10-29 VITALS — BP 140/78 | HR 86 | Temp 98.4°F | Ht 72.0 in | Wt 276.0 lb

## 2011-10-29 DIAGNOSIS — G4733 Obstructive sleep apnea (adult) (pediatric): Secondary | ICD-10-CM

## 2011-10-29 NOTE — Assessment & Plan Note (Signed)
The patient has severe obstructive sleep apnea, but is tolerating CPAP well at this time.  He has seen improvement in his sleep and daytime alertness, and seems to be tolerating the equipment well.  We'll need to increase his pressure to his optimal level of 14 cm, and I have encouraged him to work aggressively on weight loss.  He will return for a followup visit in 6 months.

## 2011-10-29 NOTE — Patient Instructions (Signed)
Will have your pressure increased to 14cm on cpap Work on weight loss followup with me in 6mos, but call if having issues with the device.

## 2011-10-29 NOTE — Progress Notes (Signed)
  Subjective:    Patient ID: Edwin Gallagher, male    DOB: 11/24/1955, 55 y.o.   MRN: [id number]  HPI The patient comes in today for followup of his obstructive sleep apnea.  He has been wearing CPAP compliantly, and feels that it has helped his sleep and daytime alertness.  He recently had an episode of acute bronchitis, and it was difficult to wear the CPAP during this time.  He has since gotten better, and has gone back to the CPAP without difficulty.  He is having no issues with the mask fit or pressure.   Review of Systems  Constitutional: Negative for fever and unexpected weight change.  HENT: Negative for ear pain, nosebleeds, congestion, sore throat, rhinorrhea, sneezing, trouble swallowing, dental problem, postnasal drip and sinus pressure.   Eyes: Negative for redness and itching.  Respiratory: Negative for cough, chest tightness, shortness of breath and wheezing.   Cardiovascular: Negative for palpitations and leg swelling.  Gastrointestinal: Negative for nausea and vomiting.  Genitourinary: Negative for dysuria.  Musculoskeletal: Negative for joint swelling.  Skin: Negative for rash.  Neurological: Negative for headaches.  Hematological: Does not bruise/bleed easily.  Psychiatric/Behavioral: Negative for dysphoric mood. The patient is not nervous/anxious.        Objective:   Physical Exam Overweight male in no acute distress No skin breakdown or pressure necrosis from the CPAP mask Lower extremities without edema, no cyanosis noted Alert and oriented, moves all 4 extremities.       Assessment & Plan:

## 2012-01-09 ENCOUNTER — Ambulatory Visit
Admission: RE | Admit: 2012-01-09 | Discharge: 2012-01-09 | Disposition: A | Discharge status: Home / Self Care | Payer: BC Managed Care – PPO | Source: Ambulatory Visit | Attending: Internal Medicine | Admitting: Internal Medicine

## 2012-01-09 ENCOUNTER — Other Ambulatory Visit: Payer: Self-pay | Admitting: Internal Medicine

## 2012-01-09 DIAGNOSIS — M79646 Pain in unspecified finger(s): Secondary | ICD-10-CM

## 2012-02-07 ENCOUNTER — Other Ambulatory Visit: Payer: Self-pay | Admitting: Orthopedic Surgery

## 2012-02-11 ENCOUNTER — Encounter: Payer: Self-pay | Admitting: Cardiology

## 2012-02-11 NOTE — Discharge Instructions (Signed)

## 2012-02-11 NOTE — H&P (Signed)
   Edwin Gallagher returns with a locking right trigger thumb. I last saw him in May of 2010. He had an excellent response following reconstruction of a significant right rotator cuff tear. We discovered him to have sleep apnea. He also had some difficulties with his prostate.  On exam at this time he has active locking of his right thumb at the A-1 pulley. He is tender on palpation. He has had some discomfort in his left thumb in the past. He has had an x-ray of the right thumb and will bring a copy of his x-ray for review.  I have advised Maze as to the choices for treatment of a trigger thumb. We can inject his thumb, however with his diabetes this is not optimal. He would like to have a one and done experience. I have told him release of the A-1 pulley under local anesthesia will be 100% successful in relieving his triggering. He would like to schedule this at a mutually convenient time in the near future. Questions regarding the surgery were invited and answered in detail.    H&P documentation: 02/12/2012  -History and Physical Reviewed  -Patient has been re-examined  -No change in the plan of care  Wyn Forster, MD

## 2012-02-12 ENCOUNTER — Encounter (HOSPITAL_BASED_OUTPATIENT_CLINIC_OR_DEPARTMENT_OTHER)
Admission: RE | Disposition: A | Discharge status: Home / Self Care | Payer: Self-pay | Source: Ambulatory Visit | Attending: Orthopedic Surgery

## 2012-02-12 ENCOUNTER — Ambulatory Visit (HOSPITAL_BASED_OUTPATIENT_CLINIC_OR_DEPARTMENT_OTHER)
Admission: RE | Admit: 2012-02-12 | Discharge: 2012-02-12 | Disposition: A | Discharge status: Home / Self Care | Payer: BC Managed Care – PPO | Source: Ambulatory Visit | Attending: Orthopedic Surgery | Admitting: Orthopedic Surgery

## 2012-02-12 ENCOUNTER — Encounter (HOSPITAL_BASED_OUTPATIENT_CLINIC_OR_DEPARTMENT_OTHER): Payer: Self-pay | Admitting: *Deleted

## 2012-02-12 DIAGNOSIS — M6530 Trigger finger, unspecified finger: Secondary | ICD-10-CM | POA: Insufficient documentation

## 2012-02-12 DIAGNOSIS — G4730 Sleep apnea, unspecified: Secondary | ICD-10-CM | POA: Insufficient documentation

## 2012-02-12 HISTORY — PX: TRIGGER FINGER RELEASE: SHX641

## 2012-02-12 SURGERY — MINOR RELEASE TRIGGER FINGER/A-1 PULLEY
Anesthesia: LOCAL | Site: Thumb | Laterality: Right | Wound class: Clean

## 2012-02-12 MED ORDER — LIDOCAINE HCL 2 % IJ SOLN
INTRAMUSCULAR | Status: DC | PRN
  Administered 2012-02-12: 3 mL

## 2012-02-12 MED ORDER — HYDROCODONE-ACETAMINOPHEN 5-325 MG PO TABS
ORAL_TABLET | ORAL | Status: AC
Start: 2012-02-12 — End: 2012-02-22

## 2012-02-12 MED ORDER — CHLORHEXIDINE GLUCONATE 4 % EX LIQD: 60.0000 mL | Freq: Once | CUTANEOUS | Status: DC

## 2012-02-12 SURGICAL SUPPLY — 39 items
BANDAGE ADHESIVE 1X3 (GAUZE/BANDAGES/DRESSINGS) IMPLANT
BLADE SURG 15 STRL LF DISP TIS (BLADE) ×1 IMPLANT
BLADE SURG 15 STRL SS (BLADE) ×1
BNDG ELASTIC 2 VLCR STRL LF (GAUZE/BANDAGES/DRESSINGS) ×2 IMPLANT
BNDG ESMARK 4X9 LF (GAUZE/BANDAGES/DRESSINGS) IMPLANT
BRUSH SCRUB EZ PLAIN DRY (MISCELLANEOUS) ×2 IMPLANT
CLOTH BEACON ORANGE TIMEOUT ST (SAFETY) ×2 IMPLANT
CORDS BIPOLAR (ELECTRODE) IMPLANT
COVER MAYO STAND STRL (DRAPES) ×2 IMPLANT
COVER TABLE BACK 60X90 (DRAPES) ×2 IMPLANT
CUFF TOURNIQUET SINGLE 18IN (TOURNIQUET CUFF) ×2 IMPLANT
DECANTER SPIKE VIAL GLASS SM (MISCELLANEOUS) IMPLANT
DRAPE EXTREMITY T 121X128X90 (DRAPE) ×2 IMPLANT
DRAPE SURG 17X23 STRL (DRAPES) ×2 IMPLANT
GAUZE SPONGE 4X4 12PLY STRL LF (GAUZE/BANDAGES/DRESSINGS) IMPLANT
GAUZE XEROFORM 1X8 LF (GAUZE/BANDAGES/DRESSINGS) ×2 IMPLANT
GLOVE BIO SURGEON STRL SZ 6.5 (GLOVE) ×2 IMPLANT
GLOVE BIOGEL M STRL SZ7.5 (GLOVE) ×2 IMPLANT
GLOVE EXAM NITRILE PF MED BLUE (GLOVE) ×2 IMPLANT
GLOVE ORTHO TXT STRL SZ7.5 (GLOVE) ×2 IMPLANT
GOWN BRE IMP PREV XXLGXLNG (GOWN DISPOSABLE) ×4 IMPLANT
GOWN PREVENTION PLUS XLARGE (GOWN DISPOSABLE) IMPLANT
NDL SAFETY ECLIPSE 18X1.5 (NEEDLE) ×1 IMPLANT
NEEDLE 27GAX1X1/2 (NEEDLE) ×2 IMPLANT
NEEDLE HYPO 18GX1.5 SHARP (NEEDLE) ×1
NS IRRIG 1000ML POUR BTL (IV SOLUTION) ×2 IMPLANT
PACK BASIN DAY SURGERY FS (CUSTOM PROCEDURE TRAY) ×2 IMPLANT
PADDING CAST ABS 4INX4YD NS (CAST SUPPLIES)
PADDING CAST ABS COTTON 4X4 ST (CAST SUPPLIES) IMPLANT
SPONGE GAUZE 4X4 12PLY (GAUZE/BANDAGES/DRESSINGS) ×2 IMPLANT
STOCKINETTE 4X48 STRL (DRAPES) ×2 IMPLANT
SUT ETHILON 5 0 P 3 18 (SUTURE) ×1
SUT NYLON ETHILON 5-0 P-3 1X18 (SUTURE) ×1 IMPLANT
SYR 3ML 23GX1 SAFETY (SYRINGE) IMPLANT
SYR CONTROL 10ML LL (SYRINGE) ×2 IMPLANT
TOWEL OR 17X24 6PK STRL BLUE (TOWEL DISPOSABLE) ×4 IMPLANT
TRAY DSU PREP LF (CUSTOM PROCEDURE TRAY) ×2 IMPLANT
UNDERPAD 30X30 INCONTINENT (UNDERPADS AND DIAPERS) ×2 IMPLANT
WATER STERILE IRR 1000ML POUR (IV SOLUTION) IMPLANT

## 2012-02-12 NOTE — Op Note (Signed)
Op note dictated 02/12/12 [id number]

## 2012-02-12 NOTE — Brief Op Note (Signed)
02/12/2012  9:59 AM  PATIENT:  Edwin Gallagher  56 y.o. male  PRE-OPERATIVE DIAGNOSIS:  trigger right thumb  POST-OPERATIVE DIAGNOSIS:  locked right trigger thumb  PROCEDURE:  RELEASE TRIGGER THUMB /A-1 PULLEY (Right)  SURGEON:   Wyn Forster., MD   PHYSICIAN ASSISTANT:   ASSISTANTS: Mallory Shirk.A-C   ANESTHESIA:   local  EBL:     BLOOD ADMINISTERED:none  DRAINS: none   LOCAL MEDICATIONS USED:  LIDOCAINE 3 cc 2 %  SPECIMEN:  No Specimen  DISPOSITION OF SPECIMEN:  N/A  COUNTS:  YES  TOURNIQUET:   Total Tourniquet Time Documented: Forearm (Right) - 4 minutes  DICTATION: .Other Dictation: Dictation Number [id number]  PLAN OF CARE: Discharge to home after PACU  PATIENT DISPOSITION:  PACU - hemodynamically stable.

## 2012-02-13 NOTE — Op Note (Signed)
NAME:  Edwin Gallagher, Edwin Gallagher                       ACCOUNT NO.:  MEDICAL RECORD NO.:  [id number]  LOCATION:                                 FACILITY:  PHYSICIAN:  Katy Fitch. , M.D. DATE OF BIRTH:  July 27, 1956  DATE OF PROCEDURE:  02/12/2012 DATE OF DISCHARGE:                              OPERATIVE REPORT   PREOPERATIVE DIAGNOSIS:  Locked right trigger thumb.  POSTOP DIAGNOSIS:  Locked right trigger thumb.  OPERATIONS:  Release of right thumb A1 pulley.  OPERATING SURGEON:  Katy Fitch. , M.D.  ASSISTANT:  Jonni Sanger, P.A..  ANESTHESIA:  2% lidocaine field block and flexor sheath block, right thumb.  This was a minor operating room procedure.  INDICATIONS:  Edwin Gallagher is a well known 56 year old gentleman.  He has type 2 diabetes, hypertension, moderate obesity, and obstructive sleep apnea.  We had cared for him in the past for a significant right shoulder predicament.  He returned to the office with a locking right trigger thumb.  We provided detailed informed consent.  Due to his underlying diabetes, we recommended proceeding directly to release the A1 pulley due to the likelihood that he would not have successful resolution with steroid injection particularly in view of his diabetes.  After informed consent, he was brought to the operating at this time.  PROCEDURE:  Edwin Gallagher was brought to room 2 of the [facility] and placed supine position on the operating table.  Following informed consent and Betadine prep, 3 mL of 2% lidocaine were infiltrated into the path intended incision and into the flexor sheath of the right thumb.  Thereafter, the right hand and arm were prepped with Betadine soap and solution, sterilely draped.  A pneumatic tourniquet was applied proximal forearm.  Following exsanguination of the right hand and forearm by direct compression, the arterial tourniquet was inflated to 250 mmHg. Procedure commenced with a routine surgical  time-out.  Thereafter, a short transverse incision was fashioned directly over the area of maximum locking.  Subcutaneous tissues were carefully divided revealing a bit of hemorrhagic synovium in the flexor sheath.  This was debrided with a rongeur.  The A1 pulley was markedly thickened with a wall thickness of more than 3 mm.  This was carefully exposed with the use of Freer elevator and blunt Ragnell retractors taking care to retract the radial proper digital nerve.  The pulley was split with scalpel and scissors.  Thereafter, full active range of motion of the IP joint was identified.  The tendon was delivered and found to be a bit swollen but otherwise unremarkable.  The wound was then repaired with 2 horizontal mattress sutures of 5-0 nylon.  For aftercare, Edwin Gallagher was placed in compressive dressing with Ace wrap.  Tourniquet was released with immediate cap refill of the fingers and thumb.  No apparent complications.     Katy Fitch , M.D.     RVS/MEDQ  D:  02/12/2012  T:  02/12/2012  Job:  [id number]

## 2012-02-14 ENCOUNTER — Encounter (HOSPITAL_BASED_OUTPATIENT_CLINIC_OR_DEPARTMENT_OTHER): Payer: Self-pay | Admitting: Orthopedic Surgery

## 2012-04-28 ENCOUNTER — Ambulatory Visit: Payer: BC Managed Care – PPO | Admitting: Pulmonary Disease

## 2012-04-30 ENCOUNTER — Ambulatory Visit (INDEPENDENT_AMBULATORY_CARE_PROVIDER_SITE_OTHER): Payer: BC Managed Care – PPO | Admitting: Pulmonary Disease

## 2012-04-30 ENCOUNTER — Encounter: Payer: Self-pay | Admitting: Pulmonary Disease

## 2012-04-30 VITALS — BP 124/80 | HR 80 | Temp 98.9°F | Ht 72.0 in | Wt 282.6 lb

## 2012-04-30 DIAGNOSIS — G4733 Obstructive sleep apnea (adult) (pediatric): Secondary | ICD-10-CM

## 2012-04-30 NOTE — Patient Instructions (Addendum)
Will have your equipment company show you how to use the heated humidifier, and also the ramp button on your machine.  Work on [employer] loss Will see you back in 12mos, but please call me if you are having tolerance issues that is keeping you from wearing cpap.  Keep in mind this is a very significant health issue for you.

## 2012-04-30 NOTE — Progress Notes (Signed)
  Subjective:    Patient ID: Edwin Gallagher, male    DOB: Nov 14, 1955, 56 y.o.   MRN: [id number]  HPI The patient comes in today for followup of his known severe obstructive sleep apnea.  Unfortunately, he has not been wearing CPAP on a consistent basis, and states that he is having issues with dry cool air.  He is using his humidifier, but was not aware that he could control the heater setting.  He also has issues at times with his pressure, but was also unaware of the ramp feature on his CPAP machine.  He denies any issues with his mask fit or significant leaking.  He has continued to have nonrestorative sleep and daytime sleepiness.   Review of Systems  Constitutional: Negative.  Negative for fever and unexpected weight change.  HENT: Positive for congestion. Negative for ear pain, nosebleeds, sore throat, rhinorrhea, sneezing, trouble swallowing, dental problem, postnasal drip and sinus pressure.   Eyes: Negative.  Negative for redness and itching.  Respiratory: Negative.  Negative for cough, chest tightness, shortness of breath and wheezing.   Cardiovascular: Negative.  Negative for palpitations and leg swelling.  Gastrointestinal: Negative.  Negative for nausea and vomiting.  Genitourinary: Negative.  Negative for dysuria.  Musculoskeletal: Negative.  Negative for joint swelling.  Skin: Negative.  Negative for rash.  Neurological: Negative.  Negative for headaches.  Hematological: Negative.  Does not bruise/bleed easily.  Psychiatric/Behavioral: Negative.  Negative for dysphoric mood. The patient is not nervous/anxious.        Objective:   Physical Exam Obese male in no acute distress Nose without purulence or discharge noted No skin breakdown or pressure necrosis from the CPAP mask Chest is clear to auscultation Lower extremities with 1+ edema bilaterally, no cyanosis Patient is extremely sleepy, but answers questions appropriately, moves all 4 extremities.       Assessment &  Plan:

## 2013-01-21 ENCOUNTER — Encounter (HOSPITAL_COMMUNITY): Payer: Self-pay | Admitting: Emergency Medicine

## 2013-01-21 ENCOUNTER — Emergency Department (HOSPITAL_COMMUNITY)
Admission: EM | Admit: 2013-01-21 | Discharge: 2013-01-22 | Disposition: A | Discharge status: Home / Self Care | Payer: BC Managed Care – PPO | Attending: Emergency Medicine | Admitting: Emergency Medicine

## 2013-01-21 DIAGNOSIS — B309 Viral conjunctivitis, unspecified: Secondary | ICD-10-CM

## 2013-01-21 DIAGNOSIS — Z7982 Long term (current) use of aspirin: Secondary | ICD-10-CM | POA: Insufficient documentation

## 2013-01-21 DIAGNOSIS — H53149 Visual discomfort, unspecified: Secondary | ICD-10-CM | POA: Insufficient documentation

## 2013-01-21 DIAGNOSIS — E785 Hyperlipidemia, unspecified: Secondary | ICD-10-CM | POA: Insufficient documentation

## 2013-01-21 DIAGNOSIS — E119 Type 2 diabetes mellitus without complications: Secondary | ICD-10-CM | POA: Insufficient documentation

## 2013-01-21 DIAGNOSIS — Z79899 Other long term (current) drug therapy: Secondary | ICD-10-CM | POA: Insufficient documentation

## 2013-01-21 DIAGNOSIS — H5789 Other specified disorders of eye and adnexa: Secondary | ICD-10-CM | POA: Insufficient documentation

## 2013-01-21 DIAGNOSIS — I10 Essential (primary) hypertension: Secondary | ICD-10-CM | POA: Insufficient documentation

## 2013-01-21 DIAGNOSIS — H538 Other visual disturbances: Secondary | ICD-10-CM | POA: Insufficient documentation

## 2013-01-21 MED ORDER — FLUORESCEIN SODIUM 1 MG OP STRP
1.0000 | ORAL_STRIP | Freq: Once | OPHTHALMIC | Status: AC
  Administered 2013-01-22: 1 via OPHTHALMIC
  Filled 2013-01-21: qty 1

## 2013-01-21 MED ORDER — TETRACAINE HCL 0.5 % OP SOLN
2.0000 [drp] | Freq: Once | OPHTHALMIC | Status: AC
  Administered 2013-01-22: 2 [drp] via OPHTHALMIC
  Filled 2013-01-21: qty 2

## 2013-01-21 NOTE — ED Notes (Addendum)
Pt states he has been having redness to both eyes, itching and watering that began Sunday. Pt denies any allergies. Pt states he recently had a medication change for his b/p and thought it may have been the reason he was having the sx so he d/c'd the medication. Pt states he has noticed he had some yellow drainage to inner corner of eyes. Visible redness to both eyes. Denies pain. C/o some blurry vision, but no other visual changes.

## 2013-01-21 NOTE — ED Notes (Signed)
PT. REPORTS BILATERAL RED , ITCHY EYES WITH DRAINAGE AND BLURRED VISION FOR SEVERAL DAYS , DENIES INJURY .

## 2013-01-22 MED ORDER — NAPHAZOLINE-PHENIRAMINE 0.025-0.3 % OP SOLN: 1.0000 [drp] | OPHTHALMIC | Status: DC

## 2013-01-22 NOTE — ED Provider Notes (Signed)
History    This chart was scribed for non-physician practitioner working with Gilda Crease, * by ED Scribe, Burman Nieves. This patient was seen in room TR09C/TR09C and the patient's care was started at 11:00 PM.    CSN: [id number]  Arrival date & time 01/21/13  2300   First MD Initiated Contact with Patient 01/21/13 2333      Chief Complaint  Patient presents with  . Conjunctivitis    (Consider location/radiation/quality/duration/timing/severity/associated sxs/prior treatment) Patient is a 57 y.o. male presenting with conjunctivitis. The history is provided by the patient and medical records. No language interpreter was used.  Conjunctivitis  Associated symptoms include eye itching, photophobia, eye discharge (clear, watery) and eye redness. Pertinent negatives include no fever, no abdominal pain, no constipation, no diarrhea, no nausea, no vomiting, no headaches, no mouth sores, no neck pain, no cough, no wheezing and no rash.    Patient is a 57 y.o. male presenting with conjunctivitis. The history is provided by the patient. No language interpreter was used.   Conjunctivitis  The current episode started 2 days ago. The problem occurs continuously. The problem has been unchanged. The problem is moderate. Nothing relieves the symptoms. Associated symptoms include eye itching and eye redness. Pertinent negatives include no fever, no abdominal pain, no constipation, no diarrhea, no nausea, no vomiting, no headaches, no mouth sores, no cough, no wheezing and no rash.   Edwin Gallagher is a 57 y.o. male who presents to the Emergency Department complaining of moderate constant irritation in both eyes onset a couple days ago. Pt complains of blurred vision due to wateriness and itchy eyes. Pt c/o eye redness and irritation. He states that he works nights and tried to lie down today to take a nap and they were very watery. He states the sensation in his eyes is "scratchy". Pt has a mild case  of photophobia which may exacerbate sx's. Pt denies any current injuries. Pt denies any rhinorrhea, fever, chills, cough, nausea, vomiting, diarrhea, SOB, weakness, and any other associated symptoms. Pt denies any sickness in family currently. Pt denies purulent drainage or eyes matted shut.     Past Medical History  Diagnosis Date  . Chest discomfort   . Hypertension   . Abnormal LFTs   . Diabetes mellitus   . Hyperlipidemia     Past Surgical History  Procedure Laterality Date  . Rotator cuff repair  2009    Right  . Trigger finger release  02/12/2012    Procedure: MINOR RELEASE TRIGGER FINGER/A-1 PULLEY;  Surgeon: Wyn Forster., MD;  Location: [facility];  Service: Orthopedics;  Laterality: Right;  right thumb    Family History  Problem Relation Age of Onset  . Hypertension Mother   . Alcohol abuse Brother   . Hypertension Father     History  Substance Use Topics  . Smoking status: Never Smoker   . Smokeless tobacco: Not on file  . Alcohol Use: No      Review of Systems  Constitutional: Negative for fever, diaphoresis, appetite change, fatigue and unexpected weight change.  HENT: Negative for mouth sores, neck pain and neck stiffness.   Eyes: Positive for photophobia, discharge (clear, watery), redness and itching. Negative for visual disturbance.  Respiratory: Negative for cough, chest tightness, shortness of breath and wheezing.   Gastrointestinal: Negative for nausea, vomiting, abdominal pain, diarrhea and constipation.  Genitourinary: Negative for urgency.  Musculoskeletal: Negative for arthralgias.  Skin: Negative for rash.  Allergic/Immunologic: Negative for immunocompromised state.  Neurological: Negative for headaches.  Hematological: Negative for adenopathy.  Psychiatric/Behavioral: The patient is not nervous/anxious.     Allergies  Review of patient's allergies indicates no known allergies.  Home Medications   Current Outpatient  Rx  Name  Route  Sig  Dispense  Refill  . amLODipine (NORVASC) 10 MG tablet   Oral   Take 10 mg by mouth daily.           Marland Kitchen aspirin 81 MG tablet   Oral   Take 81 mg by mouth daily.           Marland Kitchen glimepiride (AMARYL) 4 MG tablet   Oral   Take 4 mg by mouth 2 (two) times daily.           Marland Kitchen losartan-hydrochlorothiazide (HYZAAR) 100-25 MG per tablet   Oral   Take 1 tablet by mouth daily.           . metFORMIN (GLUCOPHAGE) 1000 MG tablet   Oral   Take 1,000 mg by mouth daily.          . metoprolol (TOPROL-XL) 100 MG 24 hr tablet   Oral   Take 100 mg by mouth daily.           . naphazoline-pheniramine (NAPHCON-A) 0.025-0.3 % ophthalmic solution   Ophthalmic   Apply 1 drop to eye every 2 (two) hours.   5 mL   0   . simvastatin (ZOCOR) 20 MG tablet   Oral   Take 20 mg by mouth at bedtime.             BP 184/82  Pulse 67  Temp(Src) 97.9 F (36.6 C) (Oral)  Resp 14  SpO2 97%  Physical Exam  Nursing note and vitals reviewed. Constitutional: He is oriented to person, place, and time. He appears well-developed and well-nourished. No distress.  HENT:  Head: Normocephalic and atraumatic.  Right Ear: External ear and ear canal normal. No swelling. Tympanic membrane is erythematous. Tympanic membrane is not bulging. No middle ear effusion.  Left Ear: External ear and ear canal normal. No swelling. Tympanic membrane is erythematous. Tympanic membrane is not bulging.  No middle ear effusion.  Nose: Nose normal. No mucosal edema or rhinorrhea.  Mouth/Throat: Uvula is midline, oropharynx is clear and moist and mucous membranes are normal. No oropharyngeal exudate, posterior oropharyngeal edema, posterior oropharyngeal erythema or tonsillar abscesses.  Eyes: EOM are normal. Pupils are equal, round, and reactive to light. Right eye exhibits discharge (clear, watery). Right eye exhibits no exudate and no hordeolum. No foreign body present in the right eye. Left eye exhibits  discharge ( clear watery). Left eye exhibits no exudate and no hordeolum. No foreign body present in the left eye. Right conjunctiva is injected. Right conjunctiva has no hemorrhage. Left conjunctiva is injected. Left conjunctiva has no hemorrhage. No scleral icterus. Right eye exhibits normal extraocular motion. Left eye exhibits normal extraocular motion.  Fundoscopic exam:      The right eye shows no arteriolar narrowing, no AV nicking, no exudate, no hemorrhage and no papilledema.       The left eye shows no arteriolar narrowing, no AV nicking, no exudate, no hemorrhage and no papilledema.  Slit lamp exam:      The right eye shows no corneal abrasion, no corneal flare, no corneal ulcer, no fluorescein uptake and no anterior chamber bulge.       The left eye shows no corneal abrasion,  no corneal flare, no corneal ulcer, no fluorescein uptake and no anterior chamber bulge.  Neck: Normal range of motion and full passive range of motion without pain. Neck supple. No spinous process tenderness and no muscular tenderness present. Normal range of motion present.  Cardiovascular: Normal rate, regular rhythm, normal heart sounds and intact distal pulses.  Exam reveals no gallop and no friction rub.   No murmur heard. Pulmonary/Chest: Effort normal and breath sounds normal. No respiratory distress. He has no wheezes. He has no rales. He exhibits no tenderness.  Abdominal: Soft. Bowel sounds are normal. He exhibits no distension and no mass. There is no tenderness. There is no rebound and no guarding.  Musculoskeletal: Normal range of motion. He exhibits no edema.  Lymphadenopathy:       Head (right side): No submental, no submandibular, no tonsillar, no preauricular, no posterior auricular and no occipital adenopathy present.       Head (left side): No submental, no submandibular, no tonsillar, no preauricular, no posterior auricular and no occipital adenopathy present.    He has no cervical adenopathy.        Right cervical: No superficial cervical, no deep cervical and no posterior cervical adenopathy present.      Left cervical: No superficial cervical, no deep cervical and no posterior cervical adenopathy present.  Neurological: He is alert and oriented to person, place, and time. He exhibits normal muscle tone. Coordination normal.  Speech is clear and goal oriented Moves extremities without ataxia  Skin: Skin is warm and dry. No rash noted. He is not diaphoretic. No erythema.  Psychiatric: He has a normal mood and affect.    ED Course  Procedures (including critical care time)  Labs Reviewed - No data to display No results found.   1. Conjunctivitis, viral       MDM  Edwin Gallagher presents for eye watering.  Viral conjunctivitis  Patient presentation consistent with viral conjunctivitis.  No purulent discharge, corneal abrasions, entrapment, consensual photophobia, or dendritic staining with fluorescein study.  Presentation non-concerning for iritis, bacterial conjunctivitis, corneal abrasions, or HSV.  No antibiotics are indicated and patient will be prescribed naphazoline for itching.  Personal hygiene and frequent handwashing discussed.  Patient advised to followup with ophthalmologist if symptoms persist or worsen in any way including vision change or purulent discharge.  Patient verbalizes understanding and is agreeable with discharge.    I personally performed the services described in this documentation, which was scribed in my presence. The recorded information has been reviewed and is accurate.      Dahlia Client Muthersbaugh, PA-C 01/22/13 [phone]

## 2013-01-26 NOTE — ED Provider Notes (Signed)
Medical screening examination/treatment/procedure(s) were performed by non-physician practitioner and as supervising physician I was immediately available for consultation/collaboration.  Christopher J. Pollina, MD 01/26/13 2350 

## 2013-05-04 ENCOUNTER — Ambulatory Visit: Payer: BC Managed Care – PPO | Admitting: Pulmonary Disease

## 2013-05-26 ENCOUNTER — Ambulatory Visit: Payer: BC Managed Care – PPO | Admitting: Pulmonary Disease

## 2013-07-03 ENCOUNTER — Ambulatory Visit (INDEPENDENT_AMBULATORY_CARE_PROVIDER_SITE_OTHER): Payer: BC Managed Care – PPO | Admitting: Pulmonary Disease

## 2013-07-03 ENCOUNTER — Encounter: Payer: Self-pay | Admitting: Pulmonary Disease

## 2013-07-03 VITALS — BP 148/92 | HR 74 | Temp 98.2°F | Ht 72.0 in | Wt 278.8 lb

## 2013-07-03 DIAGNOSIS — G4733 Obstructive sleep apnea (adult) (pediatric): Secondary | ICD-10-CM

## 2013-07-03 NOTE — Progress Notes (Signed)
  Subjective:    Patient ID: Edwin Gallagher, male    DOB: [birth year], 57 y.o.   MRN: [id number]  HPI The patient comes in today for followup of his obstructive sleep apnea.  He is still wearing CPAP intermittently, but feels that it significantly improves his sleep and daytime alertness when he does use the device.  On the other nights, he falls asleep in the chair and forgets to put the machine on.  He is having no issues with his mask fit, but is still not adjusting the humidity to his liking.  I reminded him how to do this.  Of note, the patient's weight is down 4 pounds since last visit.   Review of Systems  Constitutional: Negative for fever and unexpected weight change.  HENT: Negative for ear pain, nosebleeds, congestion, sore throat, rhinorrhea, sneezing, trouble swallowing, dental problem, postnasal drip and sinus pressure.   Eyes: Negative for redness and itching.  Respiratory: Negative for cough, chest tightness, shortness of breath and wheezing.   Cardiovascular: Negative for palpitations and leg swelling.  Gastrointestinal: Negative for nausea and vomiting.  Genitourinary: Negative for dysuria.  Musculoskeletal: Negative for joint swelling.  Skin: Negative for rash.  Neurological: Negative for headaches.  Hematological: Does not bruise/bleed easily.  Psychiatric/Behavioral: Negative for dysphoric mood. The patient is not nervous/anxious.        Objective:   Physical Exam Obese male in no acute distress Nose without purulence or discharge noted No skin breakdown or pressure necrosis from the CPAP mask Neck without lymphadenopathy or thyromegaly Lower extremities with mild edema, no cyanosis Alert, does not appear to be sleepy, moves all 4 extremities.       Assessment & Plan:

## 2013-07-03 NOTE — Patient Instructions (Addendum)
You need to make wearing cpap a priority every night.   Work on weight loss Keep up with mask cushion changes, and supplies. followup with me in one year if doing well.

## 2013-07-03 NOTE — Assessment & Plan Note (Signed)
The patient is wearing CPAP intermittently, but continues to feel it helps his sleep and daytime alertness.  I have asked him to try and make a commitment to using every night, and he will try to do this.  I also encouraged him to work aggressively on weight loss, and to keep up with his mask changes and supplies.

## 2013-11-06 ENCOUNTER — Encounter (HOSPITAL_COMMUNITY): Payer: Self-pay | Admitting: Emergency Medicine

## 2013-11-06 ENCOUNTER — Emergency Department (INDEPENDENT_AMBULATORY_CARE_PROVIDER_SITE_OTHER)
Admission: EM | Admit: 2013-11-06 | Discharge: 2013-11-06 | Disposition: A | Discharge status: Home / Self Care | Payer: BC Managed Care – PPO | Source: Home / Self Care | Attending: Family Medicine | Admitting: Family Medicine

## 2013-11-06 DIAGNOSIS — J069 Acute upper respiratory infection, unspecified: Secondary | ICD-10-CM

## 2013-11-06 MED ORDER — PROMETHAZINE-CODEINE 6.25-10 MG/5ML PO SYRP: 5.0000 mL | ORAL_SOLUTION | Freq: Every evening | ORAL | Status: DC | PRN

## 2013-11-06 MED ORDER — PREDNISONE 10 MG PO TABS: 20.0000 mg | ORAL_TABLET | Freq: Every day | ORAL | Status: DC

## 2013-11-06 NOTE — ED Notes (Signed)
Pt c/o cold sxs onset yest w/sxs that include: dry cough, runny nose, congestion, and ST Denies: f/v/n/d, SOB, wheezing He is alert w/no signs of acute distress.

## 2013-11-06 NOTE — ED Provider Notes (Signed)
Edwin Gallagher is a 57 y.o. male who presents to Urgent Care today for 1-2 days of sore throat runny nose and a nonproductive cough. No significant fevers or chills. No nausea vomiting diarrhea or shortness of breath. Patient has not tried any medications yet. Multiple positive sick contacts at home. Patient did not receive a flu vaccination yet this year.   Past Medical History  Diagnosis Date  . Chest discomfort   . Hypertension   . Abnormal LFTs   . Diabetes mellitus   . Hyperlipidemia    History  Substance Use Topics  . Smoking status: Never Smoker   . Smokeless tobacco: Not on file  . Alcohol Use: No   ROS as above Medications reviewed. No current facility-administered medications for this encounter.   Current Outpatient Prescriptions  Medication Sig Dispense Refill  . aspirin 81 MG tablet Take 81 mg by mouth daily.        Marland Kitchen doxazosin (CARDURA) 4 MG tablet Take 4 mg by mouth 2 (two) times daily.      Marland Kitchen glimepiride (AMARYL) 4 MG tablet Take 2-4 mg by mouth daily before breakfast.       . losartan-hydrochlorothiazide (HYZAAR) 100-25 MG per tablet Take 1 tablet by mouth daily.        . metFORMIN (GLUCOPHAGE) 1000 MG tablet Take 500 mg by mouth daily.       . metoprolol (TOPROL-XL) 100 MG 24 hr tablet Take 100 mg by mouth daily.        . predniSONE (DELTASONE) 10 MG tablet Take 2 tablets (20 mg total) by mouth daily.  10 tablet  0  . promethazine-codeine (PHENERGAN WITH CODEINE) 6.25-10 MG/5ML syrup Take 5 mLs by mouth at bedtime as needed for cough.  120 mL  0    Exam:  BP 160/85  Pulse 77  Temp(Src) 98.5 F (36.9 C) (Oral)  Resp 18  SpO2 100% Gen: Well NAD HEENT: EOMI,  MMM, cobblestoning of posterior pharynx. Tympanic membranes are normal appearing bilaterally. Lungs: Normal work of breathing. CTABL Heart: RRR no MRG Abd: NABS, Soft. NT, ND Exts: Non edematous BL  LE, warm and well perfused.    Assessment and Plan: 57 y.o. male with viral URI. Plan for management  with low dose, short duration prednisone, and codeine containing cough medication. I would like to use Atrovent however patient states that he has trouble using nasal sprays would like to avoid this if possible. Additionally we'll use Tylenol for symptomatic control. Discussed warning signs or symptoms. Please see discharge instructions. Patient expresses understanding.      Rodolph Bong, MD 11/06/13 [phone]

## 2013-12-18 ENCOUNTER — Other Ambulatory Visit: Payer: Self-pay | Admitting: Internal Medicine

## 2013-12-18 ENCOUNTER — Ambulatory Visit
Admission: RE | Admit: 2013-12-18 | Discharge: 2013-12-18 | Disposition: A | Discharge status: Home / Self Care | Payer: BC Managed Care – PPO | Source: Ambulatory Visit | Attending: Internal Medicine | Admitting: Internal Medicine

## 2013-12-18 DIAGNOSIS — M79672 Pain in left foot: Secondary | ICD-10-CM

## 2014-07-05 ENCOUNTER — Ambulatory Visit: Payer: BC Managed Care – PPO | Admitting: Pulmonary Disease

## 2015-01-31 ENCOUNTER — Encounter (HOSPITAL_COMMUNITY): Payer: Self-pay | Admitting: Emergency Medicine

## 2015-01-31 ENCOUNTER — Emergency Department (HOSPITAL_COMMUNITY)
Admission: EM | Admit: 2015-01-31 | Discharge: 2015-01-31 | Disposition: A | Discharge status: Home / Self Care | Payer: BLUE CROSS/BLUE SHIELD | Source: Home / Self Care | Attending: Emergency Medicine | Admitting: Emergency Medicine

## 2015-01-31 DIAGNOSIS — J069 Acute upper respiratory infection, unspecified: Secondary | ICD-10-CM

## 2015-01-31 MED ORDER — GUAIFENESIN-CODEINE 100-10 MG/5ML PO SYRP: 10.0000 mL | ORAL_SOLUTION | ORAL | Status: DC | PRN

## 2015-01-31 NOTE — ED Notes (Signed)
C/o cold onset 3 days Sx include cough, chest d/c, runny nose, watery eyes, mild HA, facial pressure, wheezing Alert, no signs of acute distress.

## 2015-01-31 NOTE — ED Provider Notes (Signed)
CSN: [id number]     Arrival date & time 01/31/15  1110 History   First MD Initiated Contact with Patient 01/31/15 1250     Chief Complaint  Patient presents with  . URI   (Consider location/radiation/quality/duration/timing/severity/associated sxs/prior Treatment) HPI Comments: 59 year old male complaining of cough and upper respiratory congestion. Denies fever or shortness of breath. Describes a burning in the upper chest associated with PND. Symptoms began approximately 48 hours ago. He is currently not taking any medications. Has a history of hypertension, type 2 diabetes mellitus and obesity.   Past Medical History  Diagnosis Date  . Chest discomfort   . Hypertension   . Abnormal LFTs   . Diabetes mellitus   . Hyperlipidemia    Past Surgical History  Procedure Laterality Date  . Rotator cuff repair  2009    Right  . Trigger finger release  02/12/2012    Procedure: MINOR RELEASE TRIGGER FINGER/A-1 PULLEY;  Surgeon: Wyn Forster., MD;  Location: [facility];  Service: Orthopedics;  Laterality: Right;  right thumb   Family History  Problem Relation Age of Onset  . Hypertension Mother   . Alcohol abuse Brother   . Hypertension Father    History  Substance Use Topics  . Smoking status: Never Smoker   . Smokeless tobacco: Not on file  . Alcohol Use: No    Review of Systems  Constitutional: Positive for activity change and fatigue. Negative for fever and diaphoresis.  HENT: Positive for congestion, postnasal drip and rhinorrhea. Negative for ear pain, facial swelling, sore throat and trouble swallowing.   Eyes: Positive for discharge. Negative for pain and redness.  Respiratory: Positive for cough. Negative for chest tightness, shortness of breath, wheezing and stridor.   Cardiovascular: Negative.   Gastrointestinal: Negative.   Musculoskeletal: Negative.  Negative for neck pain and neck stiffness.  Neurological: Negative.     Allergies  Review of  patient's allergies indicates no known allergies.  Home Medications   Prior to Admission medications   Medication Sig Start Date End Date Taking? Authorizing Provider  amLODipine (NORVASC) 10 MG tablet Take 10 mg by mouth daily.   Yes Historical Provider, MD  aspirin 81 MG tablet Take 81 mg by mouth daily.     Yes Historical Provider, MD  losartan (COZAAR) 25 MG tablet Take 25 mg by mouth daily.   Yes Historical Provider, MD  losartan-hydrochlorothiazide (HYZAAR) 100-25 MG per tablet Take 1 tablet by mouth daily.     Yes Historical Provider, MD  metFORMIN (GLUCOPHAGE) 1000 MG tablet Take 500 mg by mouth daily.    Yes Historical Provider, MD  metoprolol (TOPROL-XL) 100 MG 24 hr tablet Take 100 mg by mouth daily.     Yes Historical Provider, MD  doxazosin (CARDURA) 4 MG tablet Take 4 mg by mouth 2 (two) times daily.    Historical Provider, MD  glimepiride (AMARYL) 4 MG tablet Take 2-4 mg by mouth daily before breakfast.     Historical Provider, MD  guaiFENesin-codeine (CHERATUSSIN AC) 100-10 MG/5ML syrup Take 10 mLs by mouth every 4 (four) hours as needed for cough or congestion. 01/31/15   Hayden Rasmussen, NP  predniSONE (DELTASONE) 10 MG tablet Take 2 tablets (20 mg total) by mouth daily. 11/06/13   Rodolph Bong, MD  promethazine-codeine (PHENERGAN WITH CODEINE) 6.25-10 MG/5ML syrup Take 5 mLs by mouth at bedtime as needed for cough. 11/06/13   Rodolph Bong, MD   BP 135/68 mmHg  Pulse  78  Temp(Src) 98.3 F (36.8 C) (Oral)  Resp 20  SpO2 100% Physical Exam  Constitutional: He is oriented to person, place, and time. He appears well-developed and well-nourished. No distress.  HENT:  Bilateral TMs are normal Oropharynx with minor erythema with clear PND. No swelling or exudates.  Eyes: EOM are normal.  Minor bilateral lower lid conjunctival erythema  Neck: Normal range of motion. Neck supple.  Cardiovascular: Normal rate, regular rhythm and normal heart sounds.   Pulmonary/Chest: Effort normal  and breath sounds normal. No respiratory distress. He has no wheezes. He has no rales.  Musculoskeletal: Normal range of motion. He exhibits no edema.  Lymphadenopathy:    He has no cervical adenopathy.  Neurological: He is alert and oriented to person, place, and time.  Skin: Skin is warm and dry. No rash noted.  Psychiatric: He has a normal mood and affect.  Nursing note and vitals reviewed.   ED Course  Procedures (including critical care time) Labs Review Labs Reviewed - No data to display  Imaging Review No results found.   MDM   1. URI (upper respiratory infection)    Sudafed PE 10 mg every 4-6 hours when necessary congestion Claritin or Allegra as needed for drainage. For stronger medicine for nasal and throat drainage may take Chlor-Trimeton 4 mg tablets, 1/2-1 tablet every 4-6 hours as needed. Cheratussin for cough as needed Drink plenty of fluids stay well-hydrated Use lots of saline nasal spray to help clear out nose     Hayden Rasmussenavid , NP 01/31/15 1408

## 2015-01-31 NOTE — Discharge Instructions (Signed)
Upper Respiratory Infection, Adult Sudafed PE 10 mg every 4-6 hours when necessary congestion Claritin or Allegra as needed for drainage. For stronger medicine for nasal and throat drainage may take Chlor-Trimeton 4 mg tablets, 1/2-1 tablet every 4-6 hours as needed. Cheratussin for cough as needed Drink plenty of fluids stay well-hydrated Use lots of saline nasal spray to help clear out nose An upper respiratory infection (URI) is also sometimes known as the common cold. The upper respiratory tract includes the nose, sinuses, throat, trachea, and bronchi. Bronchi are the airways leading to the lungs. Most people improve within 1 week, but symptoms can last up to 2 weeks. A residual cough may last even longer.  CAUSES Many different viruses can infect the tissues lining the upper respiratory tract. The tissues become irritated and inflamed and often become very moist. Mucus production is also common. A cold is contagious. You can easily spread the virus to others by oral contact. This includes kissing, sharing a glass, coughing, or sneezing. Touching your mouth or nose and then touching a surface, which is then touched by another person, can also spread the virus. SYMPTOMS  Symptoms typically develop 1 to 3 days after you come in contact with a cold virus. Symptoms vary from person to person. They may include:  Runny nose.  Sneezing.  Nasal congestion.  Sinus irritation.  Sore throat.  Loss of voice (laryngitis).  Cough.  Fatigue.  Muscle aches.  Loss of appetite.  Headache.  Low-grade fever. DIAGNOSIS  You might diagnose your own cold based on familiar symptoms, since most people get a cold 2 to 3 times a year. Your caregiver can confirm this based on your exam. Most importantly, your caregiver can check that your symptoms are not due to another disease such as strep throat, sinusitis, pneumonia, asthma, or epiglottitis. Blood tests, throat tests, and X-rays are not necessary to  diagnose a common cold, but they may sometimes be helpful in excluding other more serious diseases. Your caregiver will decide if any further tests are required. RISKS AND COMPLICATIONS  You may be at risk for a more severe case of the common cold if you smoke cigarettes, have chronic heart disease (such as heart failure) or lung disease (such as asthma), or if you have a weakened immune system. The very young and very old are also at risk for more serious infections. Bacterial sinusitis, middle ear infections, and bacterial pneumonia can complicate the common cold. The common cold can worsen asthma and chronic obstructive pulmonary disease (COPD). Sometimes, these complications can require emergency medical care and may be life-threatening. PREVENTION  The best way to protect against getting a cold is to practice good hygiene. Avoid oral or hand contact with people with cold symptoms. Wash your hands often if contact occurs. There is no clear evidence that vitamin C, vitamin E, echinacea, or exercise reduces the chance of developing a cold. However, it is always recommended to get plenty of rest and practice good nutrition. TREATMENT  Treatment is directed at relieving symptoms. There is no cure. Antibiotics are not effective, because the infection is caused by a virus, not by bacteria. Treatment may include:  Increased fluid intake. Sports drinks offer valuable electrolytes, sugars, and fluids.  Breathing heated mist or steam (vaporizer or shower).  Eating chicken soup or other clear broths, and maintaining good nutrition.  Getting plenty of rest.  Using gargles or lozenges for comfort.  Controlling fevers with ibuprofen or acetaminophen as directed by your caregiver.  Increasing usage of your inhaler if you have asthma. Zinc gel and zinc lozenges, taken in the first 24 hours of the common cold, can shorten the duration and lessen the severity of symptoms. Pain medicines may help with fever,  muscle aches, and throat pain. A variety of non-prescription medicines are available to treat congestion and runny nose. Your caregiver can make recommendations and may suggest nasal or lung inhalers for other symptoms.  HOME CARE INSTRUCTIONS   Only take over-the-counter or prescription medicines for pain, discomfort, or fever as directed by your caregiver.  Use a warm mist humidifier or inhale steam from a shower to increase air moisture. This may keep secretions moist and make it easier to breathe.  Drink enough water and fluids to keep your urine clear or pale yellow.  Rest as needed.  Return to work when your temperature has returned to normal or as your caregiver advises. You may need to stay home longer to avoid infecting others. You can also use a face mask and careful hand washing to prevent spread of the virus. SEEK MEDICAL CARE IF:   After the first few days, you feel you are getting worse rather than better.  You need your caregiver's advice about medicines to control symptoms.  You develop chills, worsening shortness of breath, or brown or red sputum. These may be signs of pneumonia.  You develop yellow or brown nasal discharge or pain in the face, especially when you bend forward. These may be signs of sinusitis.  You develop a fever, swollen neck glands, pain with swallowing, or white areas in the back of your throat. These may be signs of strep throat. SEEK IMMEDIATE MEDICAL CARE IF:   You have a fever.  You develop severe or persistent headache, ear pain, sinus pain, or chest pain.  You develop wheezing, a prolonged cough, cough up blood, or have a change in your usual mucus (if you have chronic lung disease).  You develop sore muscles or a stiff neck. Document Released: 04/24/2001 Document Revised: 01/21/2012 Document Reviewed: 02/03/2014 [facility] Patient Information 2015 [place], [place]. This information is not intended to replace advice given to you by your health  care provider. Make sure you discuss any questions you have with your health care provider.

## 2015-03-15 ENCOUNTER — Ambulatory Visit (INDEPENDENT_AMBULATORY_CARE_PROVIDER_SITE_OTHER): Payer: BLUE CROSS/BLUE SHIELD | Admitting: Neurology

## 2015-03-15 ENCOUNTER — Ambulatory Visit (INDEPENDENT_AMBULATORY_CARE_PROVIDER_SITE_OTHER): Payer: Self-pay | Admitting: Neurology

## 2015-03-15 DIAGNOSIS — M542 Cervicalgia: Secondary | ICD-10-CM | POA: Diagnosis not present

## 2015-03-15 DIAGNOSIS — G5601 Carpal tunnel syndrome, right upper limb: Secondary | ICD-10-CM

## 2015-03-15 DIAGNOSIS — R202 Paresthesia of skin: Secondary | ICD-10-CM

## 2015-03-15 NOTE — Progress Notes (Signed)
See separate EMG nerve conduction study report today

## 2015-03-15 NOTE — Procedures (Signed)
   NCS (NERVE CONDUCTION STUDY) WITH EMG (ELECTROMYOGRAPHY) REPORT   STUDY DATE: May 3rd 2016 PATIENT NAME: Edwin Gallagher DOB: April 12, 1956 MRN: [id number]    TECHNOLOGIST: Gearldine ShownLorraine Jones ELECTROMYOGRAPHER: Levert FeinsteinYan,  M.D.  CLINICAL INFORMATION:  59 year old male, complains of right-sided neck pain, radiating to his right shoulder, right arm paresthesia, subjective weakness, also complains of right leg numbness.  FINDINGS: NERVE CONDUCTION STUDY: Right peroneal sensory response was normal. Bilateral ulnar sensory and motor responses were normal. Right peroneal to EDB, tibial motor responses were normal. Left median motor and sensory responses were normal. Right median sensory response showed mildly prolonged peak latency, with normal snap amplitude. Right median motor responses showed mildly prolonged distal latency, with normal C map amplitude, conduction velocity, and F-wave latency.  NEEDLE ELECTROMYOGRAPHY: Selected needle examination was performed at right upper extremity, right lower extremity muscles, right cervical paraspinal muscles, and right lumbosacral paraspinal muscles.  Needle examination of right pronator teres, extensor digitorum communis, biceps, deltoid, triceps was normal.  There was no spontaneous activity at right cervical paraspinal muscles, right C5, 6, 7.  Selected needle examination of right lower extremity, right tibialis anterior, tibialis posterior, medial gastrocnemius, vastus lateralis, biceps femoris long head, short head was normal.  There was no spontaneous activity at right lumbar sacral paraspinal muscles, right L4, L5, S1.  IMPRESSION:  This is a slight abnormal study. There is electrodiagnostic evidence of mild to moderate right carpal tunnel syndrome. There was no evidence of right cervical radiculopathy, or right lumbosacral radiculopathy.  INTERPRETING PHYSICIAN:   Levert FeinsteinYan,  M.D. Ph.D. [facility] [address], [address] [place], [place] [address] [phone]

## 2015-03-28 ENCOUNTER — Emergency Department (HOSPITAL_COMMUNITY)
Admission: EM | Admit: 2015-03-28 | Discharge: 2015-03-28 | Disposition: A | Discharge status: Home / Self Care | Payer: BLUE CROSS/BLUE SHIELD | Source: Home / Self Care | Attending: Family Medicine | Admitting: Family Medicine

## 2015-03-28 ENCOUNTER — Encounter (HOSPITAL_COMMUNITY): Payer: Self-pay | Admitting: Emergency Medicine

## 2015-03-28 DIAGNOSIS — J302 Other seasonal allergic rhinitis: Secondary | ICD-10-CM

## 2015-03-28 DIAGNOSIS — J9801 Acute bronchospasm: Secondary | ICD-10-CM | POA: Diagnosis not present

## 2015-03-28 DIAGNOSIS — R0982 Postnasal drip: Secondary | ICD-10-CM

## 2015-03-28 MED ORDER — ALBUTEROL SULFATE (2.5 MG/3ML) 0.083% IN NEBU
2.5000 mg | INHALATION_SOLUTION | Freq: Once | RESPIRATORY_TRACT | Status: AC
  Administered 2015-03-28: 2.5 mg via RESPIRATORY_TRACT

## 2015-03-28 MED ORDER — ALBUTEROL SULFATE HFA 108 (90 BASE) MCG/ACT IN AERS: 2.0000 | INHALATION_SPRAY | RESPIRATORY_TRACT | Status: DC | PRN

## 2015-03-28 MED ORDER — IPRATROPIUM-ALBUTEROL 0.5-2.5 (3) MG/3ML IN SOLN
RESPIRATORY_TRACT | Status: AC
Start: 2015-03-28 — End: 2015-03-28
  Filled 2015-03-28: qty 3

## 2015-03-28 MED ORDER — IPRATROPIUM-ALBUTEROL 0.5-2.5 (3) MG/3ML IN SOLN
3.0000 mL | Freq: Once | RESPIRATORY_TRACT | Status: AC
Start: 2015-03-28 — End: 2015-03-28
  Administered 2015-03-28: 3 mL via RESPIRATORY_TRACT

## 2015-03-28 MED ORDER — PREDNISONE 20 MG PO TABS: ORAL_TABLET | ORAL | Status: DC

## 2015-03-28 MED ORDER — ALBUTEROL SULFATE (2.5 MG/3ML) 0.083% IN NEBU
INHALATION_SOLUTION | RESPIRATORY_TRACT | Status: AC
  Filled 2015-03-28: qty 3

## 2015-03-28 NOTE — ED Provider Notes (Signed)
CSN: [id number]     Arrival date & time 03/28/15  1445 History   First MD Initiated Contact with Patient 03/28/15 1604     Chief Complaint  Patient presents with  . Cough   (Consider location/radiation/quality/duration/timing/severity/associated sxs/prior Treatment) HPI Comments: 59 year old male with hypertension and diabetes has been having a cough for over 2 weeks. He often has call spasms which makes it difficult for him to stop coughing. Sometimes it is worse at night or while supine. It is associated with orthopnea and which she is unable to lay flat sometimes. He saw his PCP and was prescribed hydrocodone with hom- atropine and Tessalon Perles. He states this is not working and has actually gotten worse. He denies having PND but describes having upper mid tracheal secretions that he cannot cough up. He has a runny nose that is worse at night. Denies fever.   Past Medical History  Diagnosis Date  . Chest discomfort   . Hypertension   . Abnormal LFTs   . Diabetes mellitus   . Hyperlipidemia    Past Surgical History  Procedure Laterality Date  . Rotator cuff repair  2009    Right  . Trigger finger release  02/12/2012    Procedure: MINOR RELEASE TRIGGER FINGER/A-1 PULLEY;  Surgeon: Wyn Forsterobert V Sypher Jr., MD;  Location: [facility];  Service: Orthopedics;  Laterality: Right;  right thumb   Family History  Problem Relation Age of Onset  . Hypertension Mother   . Alcohol abuse Brother   . Hypertension Father    History  Substance Use Topics  . Smoking status: Never Smoker   . Smokeless tobacco: Not on file  . Alcohol Use: No    Review of Systems  Constitutional: Positive for activity change. Negative for fever and fatigue.  HENT: Positive for rhinorrhea and sneezing. Negative for postnasal drip and sore throat.   Eyes: Negative.   Respiratory: Positive for cough, shortness of breath and wheezing.   Cardiovascular: Negative.   Gastrointestinal: Negative.    Genitourinary: Negative.   Musculoskeletal: Negative.   Neurological: Negative for dizziness.       Occasional for head headache with cough  Psychiatric/Behavioral: Negative.     Allergies  Review of patient's allergies indicates no known allergies.  Home Medications   Prior to Admission medications   Medication Sig Start Date End Date Taking? Authorizing Provider  albuterol (PROVENTIL HFA;VENTOLIN HFA) 108 (90 BASE) MCG/ACT inhaler Inhale 2 puffs into the lungs every 4 (four) hours as needed for wheezing or shortness of breath. 03/28/15   Hayden Rasmussenavid , NP  amLODipine (NORVASC) 10 MG tablet Take 10 mg by mouth daily.    Historical Provider, MD  aspirin 81 MG tablet Take 81 mg by mouth daily.      Historical Provider, MD  doxazosin (CARDURA) 4 MG tablet Take 4 mg by mouth 2 (two) times daily.    Historical Provider, MD  glimepiride (AMARYL) 4 MG tablet Take 2-4 mg by mouth daily before breakfast.     Historical Provider, MD  guaiFENesin-codeine (CHERATUSSIN AC) 100-10 MG/5ML syrup Take 10 mLs by mouth every 4 (four) hours as needed for cough or congestion. 01/31/15   Hayden Rasmussenavid , NP  losartan (COZAAR) 25 MG tablet Take 25 mg by mouth daily.    Historical Provider, MD  losartan-hydrochlorothiazide (HYZAAR) 100-25 MG per tablet Take 1 tablet by mouth daily.      Historical Provider, MD  metFORMIN (GLUCOPHAGE) 1000 MG tablet Take 500 mg by mouth  daily.     Historical Provider, MD  metoprolol (TOPROL-XL) 100 MG 24 hr tablet Take 100 mg by mouth daily.      Historical Provider, MD  predniSONE (DELTASONE) 20 MG tablet Take 3 tabs po on first day, 2 tabs second day, 2 tabs third day, 1 tab fourth day, 1 tab 5th day. Take with food. 03/28/15   Hayden Rasmussenavid , NP  promethazine-codeine (PHENERGAN WITH CODEINE) 6.25-10 MG/5ML syrup Take 5 mLs by mouth at bedtime as needed for cough. 11/06/13   Rodolph BongEvan S Corey, MD   BP 119/74 mmHg  Pulse 86  Temp(Src) 99.1 F (37.3 C) (Oral)  Resp 18  SpO2 96% Physical Exam   Constitutional: He is oriented to person, place, and time. He appears well-developed and well-nourished. No distress.  HENT:  Bilateral TMs are normal Oropharynx with parallel red streaking. No current PND observed. No exudates.  Eyes: Conjunctivae and EOM are normal.  Neck: Normal range of motion. Neck supple.  Cardiovascular: Normal rate, regular rhythm and normal heart sounds.   Pulmonary/Chest: Effort normal. He has wheezes.  Bilateral diffuse coarseness and expiratory wheezing.  Musculoskeletal: He exhibits no edema or tenderness.  Lymphadenopathy:    He has no cervical adenopathy.  Neurological: He is alert and oriented to person, place, and time. He exhibits normal muscle tone.  Skin: Skin is dry.  Psychiatric: He has a normal mood and affect.  Nursing note and vitals reviewed.   ED Course  Procedures (including critical care time) Labs Review Labs Reviewed - No data to display  Imaging Review No results found.   MDM   1. Other seasonal allergic rhinitis   2. Bronchospasm   3. Cough due to bronchospasm   4. PND (post-nasal drip)    Likely the patient has allergic rhinitis with associated reactive airway disease with wheezing. Will treat initially with the DuoNeb as below. Prescribed albuterol HFA, low-dose prednisone taper, and antihistamines.    DuoNeb 5/2.5 mg . Post DuoNeb the patient states he has modest subjective relief. There is an approximate 60% reduction in adventitious sounds. Air movement has improved.       Hayden Rasmussenavid , NP 03/28/15 Rickey [id number]

## 2015-03-28 NOTE — Discharge Instructions (Signed)
Allergic Rhinitis Allergic rhinitis is when the mucous membranes in the nose respond to allergens. Allergens are particles in the air that cause your body to have an allergic reaction. This causes you to release allergic antibodies. Through a chain of events, these eventually cause you to release histamine into the blood stream. Although meant to protect the body, it is this release of histamine that causes your discomfort, such as frequent sneezing, congestion, and an itchy, runny nose.  CAUSES  Seasonal allergic rhinitis (hay fever) is caused by pollen allergens that may come from grasses, trees, and weeds. Year-round allergic rhinitis (perennial allergic rhinitis) is caused by allergens such as house dust mites, pet dander, and mold spores.  SYMPTOMS  1. Nasal stuffiness (congestion). 2. Itchy, runny nose with sneezing and tearing of the eyes. DIAGNOSIS  Your health care provider can help you determine the allergen or allergens that trigger your symptoms. If you and your health care provider are unable to determine the allergen, skin or blood testing may be used. TREATMENT  Allergic rhinitis does not have a cure, but it can be controlled by:  Medicines and allergy shots (immunotherapy).  Avoiding the allergen. Hay fever may often be treated with antihistamines in pill or nasal spray forms. Antihistamines block the effects of histamine. There are over-the-counter medicines that may help with nasal congestion and swelling around the eyes. Check with your health care provider before taking or giving this medicine.  If avoiding the allergen or the medicine prescribed do not work, there are many new medicines your health care provider can prescribe. Stronger medicine may be used if initial measures are ineffective. Desensitizing injections can be used if medicine and avoidance does not work. Desensitization is when a patient is given ongoing shots until the body becomes less sensitive to the allergen.  Make sure you follow up with your health care provider if problems continue. HOME CARE INSTRUCTIONS It is not possible to completely avoid allergens, but you can reduce your symptoms by taking steps to limit your exposure to them. It helps to know exactly what you are allergic to so that you can avoid your specific triggers. SEEK MEDICAL CARE IF:   You have a fever.  You develop a cough that does not stop easily (persistent).  You have shortness of breath.  You start wheezing.  Symptoms interfere with normal daily activities. Document Released: 07/24/2001 Document Revised: 11/03/2013 Document Reviewed: 07/06/2013 [facility] Patient Information 2015 [place], [place]. This information is not intended to replace advice given to you by your health care provider. Make sure you discuss any questions you have with your health care provider.  Bronchospasm A bronchospasm is a spasm or tightening of the airways going into the lungs. During a bronchospasm breathing becomes more difficult because the airways get smaller. When this happens there can be coughing, a whistling sound when breathing (wheezing), and difficulty breathing. Bronchospasm is often associated with asthma, but not all patients who experience a bronchospasm have asthma. CAUSES  A bronchospasm is caused by inflammation or irritation of the airways. The inflammation or irritation may be triggered by:  3. Allergies (such as to animals, pollen, food, or mold). Allergens that cause bronchospasm may cause wheezing immediately after exposure or many hours later.  4. Infection. Viral infections are believed to be the most common cause of bronchospasm.  5. Exercise.  6. Irritants (such as pollution, cigarette smoke, strong odors, aerosol sprays, and paint fumes).  7. Weather changes. Winds increase molds and pollens in  the air. Rain refreshes the air by washing irritants out. Cold air may cause inflammation.  8. Stress and emotional upset.   SIGNS AND SYMPTOMS   Wheezing.   Excessive nighttime coughing.   Frequent or severe coughing with a simple cold.   Chest tightness.   Shortness of breath.  DIAGNOSIS  Bronchospasm is usually diagnosed through a history and physical exam. Tests, such as chest X-rays, are sometimes done to look for other conditions. TREATMENT   Inhaled medicines can be given to open up your airways and help you breathe. The medicines can be given using either an inhaler or a nebulizer machine.  Corticosteroid medicines may be given for severe bronchospasm, usually when it is associated with asthma. HOME CARE INSTRUCTIONS   Always have a plan prepared for seeking medical care. Know when to call your health care provider and local emergency services (911 in the [place] Know where you can access local emergency care.  Only take medicines as directed by your health care provider.  If you were prescribed an inhaler or nebulizer machine, ask your health care provider to explain how to use it correctly. Always use a spacer with your inhaler if you were given one.  It is necessary to remain calm during an attack. Try to relax and breathe more slowly.  Control your home environment in the following ways:   Change your heating and air conditioning filter at least once a month.   Limit your use of fireplaces and wood stoves.  Do not smoke and do not allow smoking in your home.   Avoid exposure to perfumes and fragrances.   Get rid of pests (such as roaches and mice) and their droppings.   Throw away plants if you see mold on them.   Keep your house clean and dust free.   Replace carpet with wood, tile, or vinyl flooring. Carpet can trap dander and dust.   Use allergy-proof pillows, mattress covers, and box spring covers.   Wash bed sheets and blankets every week in hot water and dry them in a dryer.   Use blankets that are made of polyester or cotton.   Wash hands  frequently. SEEK MEDICAL CARE IF:   You have muscle aches.   You have chest pain.   The sputum changes from clear or white to yellow, green, gray, or bloody.   The sputum you cough up gets thicker.   There are problems that may be related to the medicine you are given, such as a rash, itching, swelling, or trouble breathing.  SEEK IMMEDIATE MEDICAL CARE IF:   You have worsening wheezing and coughing even after taking your prescribed medicines.   You have increased difficulty breathing.   You develop severe chest pain. MAKE SURE YOU:   Understand these instructions.  Will watch your condition.  Will get help right away if you are not doing well or get worse. Document Released: 11/01/2003 Document Revised: 11/03/2013 Document Reviewed: 04/20/2013 [facility] Patient Information 2015 [place], [place]. This information is not intended to replace advice given to you by your health care provider. Make sure you discuss any questions you have with your health care provider.  Cough, Adult  A cough is a reflex. It helps you clear your throat and airways. A cough can help heal your body. A cough can last 2 or 3 weeks (acute) or may last more than 8 weeks (chronic). Some common causes of a cough can include an infection, allergy, or a cold. HOME  CARE 9. Only take medicine as told by your doctor. 10. If given, take your medicines (antibiotics) as told. Finish them even if you start to feel better. 11. Use a cold steam vaporizer or humidifier in your home. This can help loosen thick spit (secretions). 12. Sleep so you are almost sitting up (semi-upright). Use pillows to do this. This helps reduce coughing. 13. Rest as needed. 14. Stop smoking if you smoke. GET HELP RIGHT AWAY IF:  You have yellowish-white fluid (pus) in your thick spit.  Your cough gets worse.  Your medicine does not reduce coughing, and you are losing sleep.  You cough up blood.  You have trouble  breathing.  Your pain gets worse and medicine does not help.  You have a fever. MAKE SURE YOU:   Understand these instructions.  Will watch your condition.  Will get help right away if you are not doing well or get worse. Document Released: 07/12/2011 Document Revised: 03/15/2014 Document Reviewed: 07/12/2011 [facility] Patient Information 2015 [place], [place]. This information is not intended to replace advice given to you by your health care provider. Make sure you discuss any questions you have with your health care provider.  How to Use an Inhaler Using your inhaler correctly is very important. Good technique will make sure that the medicine reaches your lungs.  HOW TO USE AN INHALER: 15. Take the cap off the inhaler. 16. If this is the first time using your inhaler, you need to prime it. Shake the inhaler for 5 seconds. Release four puffs into the air, away from your face. Ask your doctor for help if you have questions. 17. Shake the inhaler for 5 seconds. 18. Turn the inhaler so the bottle is above the mouthpiece. 19. Put your pointer finger on top of the bottle. Your thumb holds the bottom of the inhaler. 20. Open your mouth. 21. Either hold the inhaler away from your mouth (the width of 2 fingers) or place your lips tightly around the mouthpiece. Ask your doctor which way to use your inhaler. 22. Breathe out as much air as possible. 23. Breathe in and push down on the bottle 1 time to release the medicine. You will feel the medicine go in your mouth and throat. 24. Continue to take a deep breath in very slowly. Try to fill your lungs. 25. After you have breathed in completely, hold your breath for 10 seconds. This will help the medicine to settle in your lungs. If you cannot hold your breath for 10 seconds, hold it for as long as you can before you breathe out. 26. Breathe out slowly, through pursed lips. Whistling is an example of pursed lips. 27. If your doctor has told you to  take more than 1 puff, wait at least 15-30 seconds between puffs. This will help you get the best results from your medicine. Do not use the inhaler more than your doctor tells you to. 28. Put the cap back on the inhaler. 29. Follow the directions from your doctor or from the inhaler package about cleaning the inhaler. If you use more than one inhaler, ask your doctor which inhalers to use and what order to use them in. Ask your doctor to help you figure out when you will need to refill your inhaler.  If you use a steroid inhaler, always rinse your mouth with water after your last puff, gargle and spit out the water. Do not swallow the water. GET HELP IF:  The inhaler medicine only  partially helps to stop wheezing or shortness of breath.  You are having trouble using your inhaler.  You have some increase in thick spit (phlegm). GET HELP RIGHT AWAY IF:  The inhaler medicine does not help your wheezing or shortness of breath or you have tightness in your chest.  You have dizziness, headaches, or fast heart rate.  You have chills, fever, or night sweats.  You have a large increase of thick spit, or your thick spit is bloody. MAKE SURE YOU:   Understand these instructions.  Will watch your condition.  Will get help right away if you are not doing well or get worse. Document Released: 08/07/2008 Document Revised: 08/19/2013 Document Reviewed: 05/28/2013 [facility] Patient Information 2015 [place], [place]. This information is not intended to replace advice given to you by your health care provider. Make sure you discuss any questions you have with your health care provider.

## 2015-03-28 NOTE — ED Notes (Signed)
C/o cough States he was seen by his primary for the cough two weeks ago States the congestion is more in his chest States when he cough he gets pain across his forehead States cough is productive Does have nasal drainage and sob

## 2015-06-06 ENCOUNTER — Encounter: Payer: Self-pay | Admitting: Cardiology

## 2015-06-06 ENCOUNTER — Ambulatory Visit (INDEPENDENT_AMBULATORY_CARE_PROVIDER_SITE_OTHER): Payer: BLUE CROSS/BLUE SHIELD | Admitting: Cardiology

## 2015-06-06 VITALS — BP 130/69 | HR 76 | Ht 72.0 in | Wt 285.0 lb

## 2015-06-06 DIAGNOSIS — I10 Essential (primary) hypertension: Secondary | ICD-10-CM

## 2015-06-06 DIAGNOSIS — E119 Type 2 diabetes mellitus without complications: Secondary | ICD-10-CM | POA: Diagnosis not present

## 2015-06-06 DIAGNOSIS — R079 Chest pain, unspecified: Secondary | ICD-10-CM | POA: Diagnosis not present

## 2015-06-06 DIAGNOSIS — E785 Hyperlipidemia, unspecified: Secondary | ICD-10-CM

## 2015-06-06 MED ORDER — ISOSORBIDE MONONITRATE ER 30 MG PO TB24: 30.0000 mg | ORAL_TABLET | Freq: Every day | ORAL | Status: DC

## 2015-06-06 NOTE — Progress Notes (Signed)
Cardiology Office Note   Date:  06/06/2015   ID:  Edwin Gallagher, DOB 12/16/1955, MRN [id number]  PCP:  Enrique Sack, MD  Cardiologist:  Dr. Delton See  new  Previous with Dr. Riley Kill  Chief Complaint  Patient presents with  . Chest Pain      History of Present Illness: Edwin Gallagher is a 59 y.o. male who presents at request on Dr. Chilton Si for chest pain.  He has several risk factors for CAD with DM2, HTN, FH, hyperlipidemia.  He has been having lt diaphragmatic pain with rest and exertion, though increased with exertion.  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  No nausea or SOB.  He has seen Dr. Riley Kill in the past and had cardiac CT 2006 with calcium score of 0. He had soft plaque in LAD of about 50%.  He had echo stress test in 2012.  Normal study- EF 70% and with pk stress EF 80%.    He is not currently taking a statin.  He has sleep apnea and wears full mask at night. Recently he had a sister who died a sudden death, was SOB then died.      Past Medical History  Diagnosis Date  . Chest discomfort   . Hypertension   . Abnormal LFTs   . Diabetes mellitus   . Hyperlipidemia     Past Surgical History  Procedure Laterality Date  . Rotator cuff repair  2009    Right  . Trigger finger release  02/12/2012    Procedure: MINOR RELEASE TRIGGER FINGER/A-1 PULLEY;  Surgeon: Wyn Forster., MD;  Location: [facility];  Service: Orthopedics;  Laterality: Right;  right thumb     Current Outpatient Prescriptions  Medication Sig Dispense Refill  . amLODipine (NORVASC) 10 MG tablet Take 10 mg by mouth daily.    Marland Kitchen aspirin 81 MG tablet Take 81 mg by mouth daily.      Marland Kitchen doxazosin (CARDURA) 4 MG tablet Take 4 mg by mouth 2 (two) times daily.    Marland Kitchen glimepiride (AMARYL) 4 MG tablet Take 2-4 mg by mouth daily before breakfast.     . losartan-hydrochlorothiazide (HYZAAR) 100-25 MG per tablet Take 1 tablet by mouth daily.      . metFORMIN (GLUCOPHAGE)  1000 MG tablet Take 500 mg by mouth daily.     . metoprolol (TOPROL-XL) 100 MG 24 hr tablet Take 100 mg by mouth daily.      . isosorbide mononitrate (IMDUR) 30 MG 24 hr tablet Take 1 tablet (30 mg total) by mouth daily. 30 tablet 5   No current facility-administered medications for this visit.    Allergies:   Review of patient's allergies indicates no known allergies.    Social History:  The patient  reports that he has never smoked. He does not have any smokeless tobacco history on file. He reports that he does not drink alcohol or use illicit drugs.   Family History:  The patient's  family history includes Alcohol abuse in his brother; Hypertension in his father, mother, and sister; Stroke in his father; Sudden death in his sister. There is no history of Heart attack.    ROS:  General:no colds or fevers, no weight changes Skin:no rashes or ulcers HEENT:no blurred vision, no congestion CV:see HPI PUL:see HPI GI:no diarrhea constipation or melena, no indigestion GU:no hematuria, no dysuria MS:no joint pain, no claudication Neuro:no syncope, no lightheadedness Endo:+ diabetes,  no thyroid disease  Wt Readings from Last 3 Encounters:  06/06/15 285 lb (129.275 kg)  07/03/13 278 lb 12.8 oz (126.463 kg)  04/30/12 282 lb 9.6 oz (128.187 kg)     PHYSICAL EXAM: VS:  BP 130/69 mmHg  Pulse 76  Ht 6' (1.829 m)  Wt 285 lb (129.275 kg)  BMI 38.64 kg/m2 , BMI Body mass index is 38.64 kg/(m^2). General:Pleasant affect, NAD Skin:Warm and dry, brisk capillary refill HEENT:normocephalic, sclera clear, mucus membranes moist Neck:supple, no JVD, no bruits  Heart:S1S2 RRR without murmur, gallup, rub or click Lungs:clear without rales, rhonchi, or wheezes ZOX:WRUE, non tender, + BS, do not palpate liver spleen or masses Ext:no lower ext edema, 2+ pedal pulses, 2+ radial pulses Neuro:alert and oriented, MAE, follows commands, + facial symmetry    EKG:  EKG is ordered today. The ekg  ordered today demonstrates SR normal no acute changes.   Recent Labs: No results found for requested labs within last 365 days.    Lipid Panel No results found for: CHOL, TRIG, HDL, CHOLHDL, VLDL, LDLCALC, LDLDIRECT     Other studies Reviewed: Additional studies/ records that were reviewed today include: previous studies, stress echo and cardiac CT..   ASSESSMENT AND PLAN:  1.  Chest pain, atypical in location may be muscular skeletal.  But he also has high risk factors for CAD.  Will proceed with stress myoview.  Dr. Johney Frame saw the pt and agreed with plan.  Pt will follow up with Dr. Delton See, his new cardiologist. I did add Imdur 30 mg daily at least until nuc study.  2. HTN mostly controlled.  Continue meds.  3. Hyperlipidemia, not currently on statin.  4. OSA using CPAP  5. DM2 followed by PCP. Last hgb A1c 7.8    Current medicines are reviewed with the patient today.  The patient Has no concerns regarding medicines.  The following changes have been made:  See above Labs/ tests ordered today include:see above  Disposition:   FU:  see above  Signed, Leone Brand, NP  06/06/2015 11:22 PM    [facility] [address] [place], [place], [place]  [address]/ [address] [place], [place] Phone: [phone]; Fax: [phone]  [phone]  I have seen, examined the patient, and reviewed the above assessment and plan.  Changes to above are made where necessary.    Co Sign: Hillis Range, MD 06/07/2015 8:48 PM

## 2015-06-06 NOTE — Patient Instructions (Signed)
Medication Instructions:  Your physician has recommended you make the following change in your medication:  START Imdur  daily. An Rx has been sent to your pharmacy.   Labwork: None   Testing/Procedures: Your physician has requested that you have en exercise stress myoview. For further information please visit [email or web address] Please follow instruction sheet, as given.   Follow-Up: Your physician recommends that you schedule a follow-up appointment in: 2-3 weeks with Dr.Nelson   Any Other Special Instructions Will Be Listed Below (If Applicable).

## 2015-06-07 ENCOUNTER — Telehealth (HOSPITAL_COMMUNITY): Payer: Self-pay | Admitting: *Deleted

## 2015-06-07 NOTE — Telephone Encounter (Signed)
Patient given detailed instructions per Myocardial Perfusion Study Information Sheet for test on 06/09/15 at 0745. Patient Notified to arrive 15 minutes early, and that it is imperative to arrive on time for appointment to keep from having the test rescheduled. Patient verbalized understanding. , Adelene Idler

## 2015-06-09 ENCOUNTER — Encounter (HOSPITAL_COMMUNITY): Payer: BLUE CROSS/BLUE SHIELD

## 2015-06-09 ENCOUNTER — Ambulatory Visit (HOSPITAL_COMMUNITY): Payer: BLUE CROSS/BLUE SHIELD | Attending: Cardiology

## 2015-06-09 DIAGNOSIS — R0609 Other forms of dyspnea: Secondary | ICD-10-CM | POA: Insufficient documentation

## 2015-06-09 DIAGNOSIS — E119 Type 2 diabetes mellitus without complications: Secondary | ICD-10-CM | POA: Diagnosis not present

## 2015-06-09 DIAGNOSIS — I10 Essential (primary) hypertension: Secondary | ICD-10-CM | POA: Insufficient documentation

## 2015-06-09 DIAGNOSIS — R079 Chest pain, unspecified: Secondary | ICD-10-CM | POA: Insufficient documentation

## 2015-06-09 MED ORDER — TECHNETIUM TC 99M SESTAMIBI GENERIC - CARDIOLITE
32.6000 | Freq: Once | INTRAVENOUS | Status: AC | PRN
  Administered 2015-06-09: 33 via INTRAVENOUS

## 2015-06-10 ENCOUNTER — Ambulatory Visit (HOSPITAL_COMMUNITY): Payer: BLUE CROSS/BLUE SHIELD | Attending: Cardiology

## 2015-06-10 LAB — MYOCARDIAL PERFUSION IMAGING
Estimated workload: 7 METS
Exercise duration (min): 5 min
Exercise duration (sec): 0 s
LV dias vol: 126 mL
LV sys vol: 56 mL
MPHR: 162 {beats}/min
Peak HR: 146 {beats}/min
Percent HR: 90 %
RATE: 0.27
RPE: 17
Rest HR: 82 {beats}/min
SDS: 0
SRS: 1
SSS: 1
TID: 0.87

## 2015-06-10 MED ORDER — TECHNETIUM TC 99M SESTAMIBI GENERIC - CARDIOLITE
32.4000 | Freq: Once | INTRAVENOUS | Status: AC | PRN
  Administered 2015-06-10: 32 via INTRAVENOUS

## 2015-06-13 ENCOUNTER — Telehealth: Payer: Self-pay

## 2015-06-13 ENCOUNTER — Encounter: Payer: Self-pay | Admitting: Cardiology

## 2015-06-13 NOTE — Telephone Encounter (Signed)
-----   Message from Leone Brand, NP sent at 06/11/2015  9:42 AM EDT ----- Negative stress test, pain muscular skeletal - but keep appt with Dr. Delton See.

## 2015-06-13 NOTE — Telephone Encounter (Signed)
Pt aware of stress test results Negative stress test, pain muscular skeletal - but keep appt with Dr. Delton See. Pt verbalized understanding.

## 2015-06-17 ENCOUNTER — Ambulatory Visit: Payer: BLUE CROSS/BLUE SHIELD | Admitting: Cardiovascular Disease

## 2015-06-30 ENCOUNTER — Ambulatory Visit (INDEPENDENT_AMBULATORY_CARE_PROVIDER_SITE_OTHER): Payer: BLUE CROSS/BLUE SHIELD | Admitting: Cardiology

## 2015-06-30 ENCOUNTER — Encounter: Payer: Self-pay | Admitting: Cardiology

## 2015-06-30 VITALS — BP 134/62 | HR 80 | Ht 72.0 in | Wt 285.0 lb

## 2015-06-30 DIAGNOSIS — R072 Precordial pain: Secondary | ICD-10-CM | POA: Diagnosis not present

## 2015-06-30 DIAGNOSIS — I10 Essential (primary) hypertension: Secondary | ICD-10-CM

## 2015-06-30 DIAGNOSIS — I2510 Atherosclerotic heart disease of native coronary artery without angina pectoris: Secondary | ICD-10-CM

## 2015-06-30 DIAGNOSIS — E785 Hyperlipidemia, unspecified: Secondary | ICD-10-CM

## 2015-06-30 DIAGNOSIS — I2583 Coronary atherosclerosis due to lipid rich plaque: Principal | ICD-10-CM

## 2015-06-30 NOTE — Progress Notes (Signed)
Patient ID: Edwin Gallagher, male   DOB: 1956/07/18, 59 y.o.   MRN: [id number]    Cardiology Office Note   Date:  2015/07/01   ID:  Edwin Gallagher, DOB 02/19/1956, MRN [id number]  PCP:  Enrique Sack, MD  Cardiologist:  Dr. Delton See  new  Previous with Dr. Riley Kill  No chief complaint on file.     History of Present Illness: Edwin Gallagher is a 59 y.o. male who presents at request on Dr. Chilton Si for chest pain.  He has several risk factors for CAD with DM2, HTN, FH, hyperlipidemia.  He has been having lt diaphragmatic pain with rest and exertion, though increased with exertion.  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  No nausea or SOB.  He has seen Dr. Riley Kill in the past and had cardiac CT 2006 with calcium score of 0. He had soft plaque in LAD of about 50%.  He had echo stress test in 2012.  Normal study- EF 70% and with pk stress EF 80%.    He is not currently taking a statin.  He has sleep apnea and wears full mask at night. Recently he had a sister who died a sudden death, was SOB then died.    01-Jul-2015 - resolved left thoracic wall pain, normal nuclear stress test - no scar or ischemia, he is going to retire the next week and plans on joining a gym. 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 No LE edema, no palpitations, DOE, orthopnea. No dizziness. He was prescribed statin (possibly atorvastatin), but he doesn't take it.  Past Medical History  Diagnosis Date  . Chest discomfort   . Hypertension   . Abnormal LFTs   . Diabetes mellitus   . Hyperlipidemia   . Abnormal cardiac CT angiography 2006    only abnormal with soft plaque in LAD calcium score is0     Past Surgical History  Procedure Laterality Date  . Rotator cuff repair  2009    Right  . Trigger finger release  02/12/2012    Procedure: MINOR RELEASE TRIGGER FINGER/A-1 PULLEY;  Surgeon: Wyn Forster., MD;  Location: [facility];  Service: Orthopedics;  Laterality:  Right;  right thumb     Current Outpatient Prescriptions  Medication Sig Dispense Refill  . amLODipine (NORVASC) 10 MG tablet Take 10 mg by mouth daily.    Marland Kitchen aspirin 81 MG tablet Take 81 mg by mouth daily.      Marland Kitchen doxazosin (CARDURA) 4 MG tablet Take 4 mg by mouth 2 (two) times daily.    Marland Kitchen glimepiride (AMARYL) 4 MG tablet Take 2-4 mg by mouth daily before breakfast.     . isosorbide mononitrate (IMDUR) 30 MG 24 hr tablet Take 1 tablet (30 mg total) by mouth daily. 30 tablet 5  . losartan-hydrochlorothiazide (HYZAAR) 100-25 MG per tablet Take 1 tablet by mouth daily.      . metFORMIN (GLUCOPHAGE) 1000 MG tablet Take 500 mg by mouth daily.     . metoprolol (TOPROL-XL) 100 MG 24 hr tablet Take 100 mg by mouth daily.       No current facility-administered medications for this visit.    Allergies:   Review of patient's allergies indicates no known allergies.    Social History:  The patient  reports that he has never smoked. He does not have any smokeless tobacco history on file. He reports that he does not drink  alcohol or use illicit drugs.   Family History:  The patient's  family history includes Alcohol abuse in his brother; Hypertension in his father, mother, and sister; Stroke in his father; Sudden death in his sister. There is no history of Heart attack.    ROS:  General:no colds or fevers, no weight changes Skin:no rashes or ulcers HEENT:no blurred vision, no congestion CV:see HPI PUL:see HPI GI:no diarrhea constipation or melena, no indigestion GU:no hematuria, no dysuria MS:no joint pain, no claudication Neuro:no syncope, no lightheadedness Endo:+ diabetes, no thyroid disease  Wt Readings from Last 3 Encounters:  06/30/15 285 lb (129.275 kg)  06/09/15 285 lb (129.275 kg)  06/06/15 285 lb (129.275 kg)     PHYSICAL EXAM: VS:  BP 134/62 mmHg  Pulse 80  Ht 6' (1.829 m)  Wt 285 lb (129.275 kg)  BMI 38.64 kg/m2 , BMI Body mass index is 38.64 kg/(m^2). General:Pleasant  affect, NAD Skin:Warm and dry, brisk capillary refill HEENT:normocephalic, sclera clear, mucus membranes moist Neck:supple, no JVD, no bruits  Heart:S1S2 RRR without murmur, gallup, rub or click Lungs:clear without rales, rhonchi, or wheezes ZOX:WRUE, non tender, + BS, do not palpate liver spleen or masses Ext:no lower ext edema, 2+ pedal pulses, 2+ radial pulses Neuro:alert and oriented, MAE, follows commands, + facial symmetry  EKG:  EKG is ordered today. The ekg ordered today demonstrates SR normal no acute changes.  Lipid Panel No results found for: CHOL, TRIG, HDL, CHOLHDL, VLDL, LDLCALC, LDLDIRECT   Nuclear stress test: 06/10/15  Nuclear stress EF: 56%.  There was no ST segment deviation noted during stress.  The study is normal.  This is a low risk study.  The left ventricular ejection fraction is normal (55-65%).  No evidence of ischemia or scar by perfusion.  Other studies Reviewed: Additional studies/ records that were reviewed today include: previous studies, stress echo and cardiac CT.   ASSESSMENT AND PLAN:  1.  Chest pain, atypical in location may be muscular skeletal.  But he also has high risk factors for CAD. Stress myoview normal.    2. HTN controlled.  Continue meds.  3. Hyperlipidemia - he is a diabetic > 54 years old, alsoevidence of 50% plaque on coronary CT 8 years ago, he needs at least moderate amount of highly potent statins, we will obtain labs from his PCP.  4. OSA using CPAP  5. DM2 followed by PCP. Last hgb A1c 7.8   Follow up in 1 year.  Signed, Lars Masson, MD  06/30/2015 10:08 AM    [facility] [address] [place], [place], [place]  [address]/ [address] [place], [place] Phone: [phone]; Fax: [phone]  [phone]

## 2015-06-30 NOTE — Progress Notes (Signed)
Patient ID: Edwin Gallagher, male   DOB: 02-23-1956, 59 y.o.   MRN: [id number]  Labs: normal LFTs HDL 44 LDL 100 TG 98  Goal for LDL < 70, he still needs to take atorvastatin.  Lars Masson 06/30/2015

## 2015-06-30 NOTE — Patient Instructions (Signed)
Your physician recommends that you continue on your current medications as directed. Please refer to the Current Medication list given to you today.   Your physician wants you to follow-up in: ONE YEAR WITH DR NELSON You will receive a reminder letter in the mail two months in advance. If you don't receive a letter, please call our office to schedule the follow-up appointment.  

## 2015-12-07 ENCOUNTER — Encounter: Payer: Self-pay | Admitting: Gastroenterology

## 2017-06-26 ENCOUNTER — Telehealth: Payer: Self-pay | Admitting: Pulmonary Disease

## 2017-06-26 NOTE — Telephone Encounter (Signed)
Spoke with pt, states that his cpap pressure has been uncomfortable for "a really long time", has not been wearing cpap "for a while", is noting changes in his daily status because of not sleeping with cpap.  States that he feels he needs a new machine, requesting a sooner appt than first available on 11/9 with VS.    CY please advise if pt can be worked in sooner on a RNA slot.  Thanks!

## 2017-06-26 NOTE — Telephone Encounter (Signed)
Called and spoke to pt. Appt made with CY on 07/08/17. Pt verbalized understanding and denied any further questions or concerns at this time.

## 2017-06-26 NOTE — Telephone Encounter (Signed)
Yes patient can be seen sooner in RNA slot where able.

## 2017-07-08 ENCOUNTER — Encounter: Payer: Self-pay | Admitting: Internal Medicine

## 2017-07-08 ENCOUNTER — Ambulatory Visit (INDEPENDENT_AMBULATORY_CARE_PROVIDER_SITE_OTHER): Payer: 59 | Admitting: Internal Medicine

## 2017-07-08 DIAGNOSIS — Z6835 Body mass index (BMI) 35.0-35.9, adult: Secondary | ICD-10-CM

## 2017-07-08 DIAGNOSIS — G4733 Obstructive sleep apnea (adult) (pediatric): Secondary | ICD-10-CM | POA: Diagnosis not present

## 2017-07-08 NOTE — Patient Instructions (Signed)
Order- schedule unattended home sleep test      Dx OSA   Please call about 2 weeks after your study is done, for results and recommendations

## 2017-07-08 NOTE — Progress Notes (Signed)
07/08/17-61 year old male never smoker with obstructive sleep apnea. Had seen Dr. Shelle Iron in 2014 medical problem list includes obesity, HBP, DM 2, CAD Former [facility] patient-last seen 2014. Pt had sleep study 2012 and not in EPIC. Pt has had break in using CPAP and needs new DME as well based on Insurance.  NPSG 2012 AHI 85/ hr, CPAP titrated to 14.  Still seeking the actual report. Epworth Score 4/ 24 He says CPAP pressure was too high and irritated his sinuses. He admits he slept better with CPAP but was lost to follow-up probably getting pressure adjustment. Aware of snoring especially on his back. Not really aware of daytime sleepiness. Not using sleep medicines. No history of ENT surgery or lung disease.  Prior to Admission medications   Medication Sig Start Date End Date Taking? Authorizing Provider  amLODipine (NORVASC) 10 MG tablet Take 10 mg by mouth daily.   Yes [provider]  aspirin 81 MG tablet Take 81 mg by mouth daily.     Yes [provider]  doxazosin (CARDURA) 4 MG tablet Take 4 mg by mouth 2 (two) times daily.   Yes [provider]  glimepiride (AMARYL) 4 MG tablet Take 2-4 mg by mouth daily before breakfast.    Yes [provider]  losartan-hydrochlorothiazide (HYZAAR) 100-25 MG per tablet Take 1 tablet by mouth daily.     Yes [provider]  metFORMIN (GLUCOPHAGE) 1000 MG tablet Take 500 mg by mouth daily.    Yes [provider]  metoprolol (TOPROL-XL) 100 MG 24 hr tablet Take 100 mg by mouth daily.     Yes [provider]  simvastatin (ZOCOR) 20 MG tablet Take 1 tablet by mouth daily. 06/14/17  Yes [provider]   Past Medical History:  Diagnosis Date  . Abnormal cardiac CT angiography 2006   only abnormal with soft plaque in LAD calcium score is0   . Abnormal LFTs   . Chest discomfort   . Diabetes mellitus   . Hyperlipidemia   . Hypertension    Past Surgical History:  Procedure Laterality Date   . ROTATOR CUFF REPAIR  2009   Right  . TRIGGER FINGER RELEASE  02/12/2012   Procedure: MINOR RELEASE TRIGGER FINGER/A-1 PULLEY;  Surgeon: Wyn Forster., MD;  Location: [facility];  Service: Orthopedics;  Laterality: Right;  right thumb   Family History  Problem Relation Age of Onset  . Hypertension Mother   . Alcohol abuse Brother   . Hypertension Father   . Stroke Father   . Heart attack Neg Hx   . Sudden death Sister   . Hypertension Sister    Social History   Social History  . Marital status: Married    Spouse name: N/A  . Number of children: 8  . Years of education: N/A   Occupational History  .  [employer]   Social History Main Topics  . Smoking status: Never Smoker  . Smokeless tobacco: Never Used  . Alcohol use No  . Drug use: No  . Sexual activity: Not on file   Other Topics Concern  . Not on file   Social History Narrative  . No narrative on file   ROS-see HPI   Negative unless "+" Constitutional:    weight loss, night sweats, fevers, chills, fatigue, lassitude. HEENT:    headaches, difficulty swallowing, tooth/dental problems, sore throat,       sneezing, itching, ear ache, nasal congestion, post nasal drip, snoring  CV:    chest pain, orthopnea, PND, swelling in lower extremities, anasarca,                                                          dizziness, palpitations Resp:   shortness of breath with exertion or at rest.                productive cough,   non-productive cough, coughing up of blood.              change in color of mucus.  wheezing.   Skin:    rash or lesions. GI:  No-   heartburn, indigestion, abdominal pain, nausea, vomiting, diarrhea,                 change in bowel habits, loss of appetite GU: dysuria, change in color of urine, no urgency or frequency.   flank pain. MS:   joint pain, stiffness, decreased range of motion, back pain. Neuro-     nothing unusual Psych:  change in mood or affect.  depression or anxiety.    memory loss.  OBJ- Physical Exam General- Alert, Oriented, Affect-appropriate, Distress- none acute, + Obese Skin- rash-none, lesions- none, excoriation- none Lymphadenopathy- none Head- atraumatic            Eyes- Gross vision intact, PERRLA, conjunctivae and secretions clear            Ears- Hearing, canals-normal            Nose- Clear, no-Septal dev, mucus, polyps, erosion, perforation             Throat- Mallampati III , mucosa clear , drainage- none, tonsils- atrophic Neck- flexible , trachea midline, no stridor , thyroid nl, carotid no bruit Chest - symmetrical excursion , unlabored           Heart/CV- RRR , no murmur , no gallop  , no rub, nl s1 s2                           - JVD- none , edema- none, stasis changes- none, varices- none           Lung- clear to P&A, wheeze- none, cough- none , dullness-none, rub- none           Chest wall-  Abd-  Br/ Gen/ Rectal- Not done, not indicated Extrem- cyanosis- none, clubbing, none, atrophy- none, strength- nl Neuro- grossly intact to observation

## 2017-07-09 NOTE — Assessment & Plan Note (Signed)
I discussed the impact of body weight on airway patency and sleep apnea. He is encouraged to make an effort at lifestyle change to accomplish this.

## 2017-07-09 NOTE — Assessment & Plan Note (Signed)
He has had a break in CPAP therapy. We discussed options and I think it is likely we will be able to fit him more comfortably now. He needs to stay documentation to confirm diagnosis as discussed. We reviewed the basics of good sleep hygiene, sleep apnea, CPAP and oral appliance therapies.. Plan-schedule sleep study then anticipate initiating therapy before he returns.

## 2017-09-17 ENCOUNTER — Telehealth: Payer: Self-pay | Admitting: Internal Medicine

## 2017-09-17 DIAGNOSIS — G4733 Obstructive sleep apnea (adult) (pediatric): Secondary | ICD-10-CM

## 2017-09-17 NOTE — Telephone Encounter (Signed)
At his last OV in August, a home sleep study was supposed to be ordered and it wasn't. Order has been placed. [facility].

## 2017-09-17 NOTE — Telephone Encounter (Signed)
I have spoken to pt & have him scheduled to pick up hst 11/13 at 10:00.  Nothing further needed.

## 2017-09-17 NOTE — Telephone Encounter (Signed)
[facility]

## 2017-09-20 ENCOUNTER — Institutional Professional Consult (permissible substitution): Payer: BLUE CROSS/BLUE SHIELD | Admitting: Pulmonary Disease

## 2017-09-24 DIAGNOSIS — G4733 Obstructive sleep apnea (adult) (pediatric): Secondary | ICD-10-CM | POA: Diagnosis not present

## 2017-09-25 DIAGNOSIS — G4733 Obstructive sleep apnea (adult) (pediatric): Secondary | ICD-10-CM | POA: Diagnosis not present

## 2017-10-01 ENCOUNTER — Other Ambulatory Visit: Payer: Self-pay | Admitting: *Deleted

## 2017-10-01 DIAGNOSIS — G4733 Obstructive sleep apnea (adult) (pediatric): Secondary | ICD-10-CM

## 2017-10-07 ENCOUNTER — Telehealth: Payer: Self-pay | Admitting: Internal Medicine

## 2017-10-07 DIAGNOSIS — G4733 Obstructive sleep apnea (adult) (pediatric): Secondary | ICD-10-CM

## 2017-10-07 NOTE — Telephone Encounter (Signed)
Pt is aware of results and voiced his understanding. Order has been placed for cpap therapy. Apt has been scheduled for 01/06/17. Pt request that HST be mailed to address on file. HST has been placed in outgoing mail.  Nothing further needed.

## 2017-10-07 NOTE — Telephone Encounter (Signed)
Please advise on HST results CY.

## 2017-10-07 NOTE — Telephone Encounter (Signed)
Home sleep test- showed moderately severe obstructive sleep apnea, averaging 20 apneas/ hor, with drops in blood oxygen level.  He did have a DME before break in therapy- ok to order same or new DME, new CPAP machine auto 5-20, mask of choice humidifier, supplies, AirView    Dx OSA  Please make sure he has a return ov in 31-90 days per insurance regs

## 2017-10-09 ENCOUNTER — Ambulatory Visit: Payer: 59 | Admitting: Internal Medicine

## 2017-10-14 ENCOUNTER — Telehealth: Payer: Self-pay | Admitting: Internal Medicine

## 2017-10-14 NOTE — Telephone Encounter (Signed)
Spoke to CisneSharon at [employer] they just got the authorization so she said someone should be calling him by the end of the week

## 2017-10-14 NOTE — Telephone Encounter (Signed)
Routing to [facility] per protocol 

## 2017-10-14 NOTE — Telephone Encounter (Signed)
Spoke with pt, and advised him of message. Pt understood and will await their call.

## 2018-01-02 ENCOUNTER — Ambulatory Visit: Payer: 59 | Admitting: Internal Medicine

## 2018-01-05 ENCOUNTER — Encounter: Payer: Self-pay | Admitting: Internal Medicine

## 2018-01-06 ENCOUNTER — Ambulatory Visit (INDEPENDENT_AMBULATORY_CARE_PROVIDER_SITE_OTHER): Payer: 59 | Admitting: Internal Medicine

## 2018-01-06 ENCOUNTER — Encounter: Payer: Self-pay | Admitting: Internal Medicine

## 2018-01-06 VITALS — BP 142/68 | HR 79 | Ht 72.0 in | Wt 284.0 lb

## 2018-01-06 DIAGNOSIS — G4733 Obstructive sleep apnea (adult) (pediatric): Secondary | ICD-10-CM

## 2018-01-06 NOTE — Progress Notes (Signed)
HPI male never smoker followed for OSA, complicated by obesity, DM 2, HBP NPSG 2012 AHI 85/ hr, CPAP titrated to 14 HST-09/24/17-AHI 20.9/hour, desaturation to 84%, body weight 289 pounds  -------------------------------------- 07/08/17-62 year old male never smoker with obstructive sleep apnea. Had seen Dr. Shelle Ironlance in 2014 medical problem list includes obesity, HBP, DM 2, CAD [facility]-last seen 2014. Pt had sleep study 2012 and not in EPIC. Pt has had break in using CPAP and needs new DME as well based on Insurance.  NPSG 2012 AHI 85/ hr, CPAP titrated to 14.  Still seeking the actual report. Epworth Score 4/ 24 He says CPAP pressure was too high and irritated his sinuses. He admits he slept better with CPAP but was lost to follow-up probably getting pressure adjustment. Aware of snoring especially on his back. Not really aware of daytime sleepiness. Not using sleep medicines. No history of ENT surgery or lung disease.  01/06/18-62 year old male never smoker followed for OSA, complicated by obesity, DM 2, HBP HST-09/24/17-AHI 20.9/hour, desaturation to 84%, body weight 289 pounds CPAP auto 5-20/ [facility]>> today auto 5-15 ----OSA.DME-[facility].CPAP pillow cushions working well. Pr. strong at times. Download compliance 87%, AHI 1.3/hour Missed 3 days CPAP with bad cold, but otherwise sleeps better and comfortable with CPAP. Discussed pressure range.    ROS-see HPI   + = positive Constitutional:    weight loss, night sweats, fevers, chills, fatigue, lassitude. HEENT:    headaches, difficulty swallowing, tooth/dental problems, sore throat,       sneezing, itching, ear ache, nasal congestion, post nasal drip, snoring CV:    chest pain, orthopnea, PND, swelling in lower extremities, anasarca,                                                          dizziness, palpitations Resp:   shortness of breath with exertion or at rest.                productive cough,   non-productive  cough, coughing up of blood.              change in color of mucus.  wheezing.   Skin:    rash or lesions. GI:  No-   heartburn, indigestion, abdominal pain, nausea, vomiting, diarrhea,                 change in bowel habits, loss of appetite GU: dysuria, change in color of urine, no urgency or frequency.   flank pain. MS:   joint pain, stiffness, decreased range of motion, back pain. Neuro-     nothing unusual Psych:  change in mood or affect.  depression or anxiety.   memory loss.  OBJ- Physical Exam General- Alert, Oriented, Affect-appropriate, Distress- none acute, + Obese Skin- rash-none, lesions- none, excoriation- none Lymphadenopathy- none Head- atraumatic            Eyes- Gross vision intact, PERRLA, conjunctivae and secretions clear            Ears- Hearing, canals-normal            Nose- Clear, no-Septal dev, mucus, polyps, erosion, perforation             Throat- Mallampati III , mucosa clear , drainage- none, tonsils- atrophic Neck- flexible , trachea midline, no stridor ,  thyroid nl, carotid no bruit Chest - symmetrical excursion , unlabored           Heart/CV- RRR , no murmur , no gallop  , no rub, nl s1 s2                           - JVD- none , edema- none, stasis changes- none, varices- none           Lung- clear to P&A, wheeze- none, cough- none , dullness-none, rub- none           Chest wall-  Abd-  Br/ Gen/ Rectal- Not done, not indicated Extrem- cyanosis- none, clubbing, none, atrophy- none, strength- nl Neuro- grossly intact to observation

## 2018-01-06 NOTE — Patient Instructions (Addendum)
Order- DME [facility] Please change autopap range to 5-15, continue mask of choice, humidfier, supplies, AirView     Please call if we can help

## 2018-01-07 ENCOUNTER — Telehealth: Payer: Self-pay | Admitting: Internal Medicine

## 2018-01-08 NOTE — Telephone Encounter (Signed)
Called [facility] and spoke with Ruby clarifying the order from pt's last visit.  Per CY: change autopap range to 5-15cm, continue mask of choice, humidifier, supplies, AirView.  Ruby expressed understanding. Nothing further needed at this current time.

## 2018-01-09 NOTE — Assessment & Plan Note (Signed)
He understands that weight loss would help him.  Encouraged.

## 2018-01-09 NOTE — Assessment & Plan Note (Signed)
Download confirms good compliance and control.  He finds he sleeps better with CPAP and is satisfied he benefits. Plan-reduce pressure range to 5-15, working on comfort and leak.

## 2018-03-05 ENCOUNTER — Encounter: Payer: Self-pay | Admitting: Gastroenterology

## 2018-04-25 ENCOUNTER — Encounter: Payer: Self-pay | Admitting: Gastroenterology

## 2018-06-18 ENCOUNTER — Other Ambulatory Visit: Payer: Self-pay

## 2018-06-18 ENCOUNTER — Emergency Department (HOSPITAL_COMMUNITY): Payer: 59

## 2018-06-18 ENCOUNTER — Emergency Department (HOSPITAL_COMMUNITY)
Admission: EM | Admit: 2018-06-18 | Discharge: 2018-06-18 | Disposition: A | Discharge status: Home / Self Care | Payer: 59 | Attending: Emergency Medicine | Admitting: Emergency Medicine

## 2018-06-18 ENCOUNTER — Encounter (HOSPITAL_COMMUNITY): Payer: Self-pay | Admitting: Emergency Medicine

## 2018-06-18 DIAGNOSIS — I10 Essential (primary) hypertension: Secondary | ICD-10-CM | POA: Diagnosis not present

## 2018-06-18 DIAGNOSIS — Z7982 Long term (current) use of aspirin: Secondary | ICD-10-CM | POA: Insufficient documentation

## 2018-06-18 DIAGNOSIS — Z7984 Long term (current) use of oral hypoglycemic drugs: Secondary | ICD-10-CM | POA: Diagnosis not present

## 2018-06-18 DIAGNOSIS — R079 Chest pain, unspecified: Secondary | ICD-10-CM | POA: Diagnosis present

## 2018-06-18 DIAGNOSIS — E119 Type 2 diabetes mellitus without complications: Secondary | ICD-10-CM | POA: Insufficient documentation

## 2018-06-18 DIAGNOSIS — I480 Paroxysmal atrial fibrillation: Secondary | ICD-10-CM | POA: Diagnosis not present

## 2018-06-18 DIAGNOSIS — N179 Acute kidney failure, unspecified: Secondary | ICD-10-CM

## 2018-06-18 DIAGNOSIS — R42 Dizziness and giddiness: Secondary | ICD-10-CM | POA: Insufficient documentation

## 2018-06-18 DIAGNOSIS — Z79899 Other long term (current) drug therapy: Secondary | ICD-10-CM | POA: Diagnosis not present

## 2018-06-18 DIAGNOSIS — R0602 Shortness of breath: Secondary | ICD-10-CM | POA: Insufficient documentation

## 2018-06-18 LAB — COMPREHENSIVE METABOLIC PANEL
ALT: 32 U/L (ref 0–44)
AST: 35 U/L (ref 15–41)
Albumin: 3.6 g/dL (ref 3.5–5.0)
Alkaline Phosphatase: 67 U/L (ref 38–126)
Anion gap: 10 (ref 5–15)
BUN: 20 mg/dL (ref 8–23)
CO2: 26 mmol/L (ref 22–32)
Calcium: 9.2 mg/dL (ref 8.9–10.3)
Chloride: 105 mmol/L (ref 98–111)
Creatinine, Ser: 1.69 mg/dL — ABNORMAL HIGH (ref 0.61–1.24)
GFR calc Af Amer: 49 mL/min — ABNORMAL LOW (ref >60)
GFR calc non Af Amer: 42 mL/min — ABNORMAL LOW (ref >60)
Glucose, Bld: 231 mg/dL — ABNORMAL HIGH (ref 70–99)
Potassium: 3.6 mmol/L (ref 3.5–5.1)
Sodium: 141 mmol/L (ref 135–145)
Total Bilirubin: 0.7 mg/dL (ref 0.3–1.2)
Total Protein: 6.9 g/dL (ref 6.5–8.1)

## 2018-06-18 LAB — I-STAT TROPONIN, ED
Troponin i, poc: 0.01 ng/mL (ref 0.00–0.08)
Troponin i, poc: 0.01 ng/mL (ref 0.00–0.08)

## 2018-06-18 LAB — CBC WITH DIFFERENTIAL/PLATELET
Abs Immature Granulocytes: 0 10*3/uL (ref 0.0–0.1)
Basophils Absolute: 0 10*3/uL (ref 0.0–0.1)
Basophils Relative: 1 %
Eosinophils Absolute: 0.1 10*3/uL (ref 0.0–0.7)
Eosinophils Relative: 1 %
HCT: 40.3 % (ref 39.0–52.0)
Hemoglobin: 12.6 g/dL — ABNORMAL LOW (ref 13.0–17.0)
Immature Granulocytes: 0 %
Lymphocytes Relative: 22 %
Lymphs Abs: 1.7 10*3/uL (ref 0.7–4.0)
MCH: 29 pg (ref 26.0–34.0)
MCHC: 31.3 g/dL (ref 30.0–36.0)
MCV: 92.6 fL (ref 78.0–100.0)
Monocytes Absolute: 0.6 10*3/uL (ref 0.1–1.0)
Monocytes Relative: 8 %
Neutro Abs: 5.2 10*3/uL (ref 1.7–7.7)
Neutrophils Relative %: 68 %
Platelets: 156 10*3/uL (ref 150–400)
RBC: 4.35 MIL/uL (ref 4.22–5.81)
RDW: 13.8 % (ref 11.5–15.5)
WBC: 7.6 10*3/uL (ref 4.0–10.5)

## 2018-06-18 LAB — D-DIMER, QUANTITATIVE: D-Dimer, Quant: 0.91 ug/mL-FEU — ABNORMAL HIGH (ref 0.00–0.50)

## 2018-06-18 MED ORDER — ELIQUIS 5 MG VTE STARTER PACK: ORAL_TABLET | ORAL | 0 refills | Status: DC

## 2018-06-18 MED ORDER — SODIUM CHLORIDE 0.9 % IV BOLUS
1000.0000 mL | Freq: Once | INTRAVENOUS | Status: AC
  Administered 2018-06-18: 1000 mL via INTRAVENOUS

## 2018-06-18 MED ORDER — DILTIAZEM HCL 25 MG/5ML IV SOLN
10.0000 mg | Freq: Once | INTRAVENOUS | Status: AC
  Administered 2018-06-18: 10 mg via INTRAVENOUS
  Filled 2018-06-18: qty 5

## 2018-06-18 MED ORDER — IOPAMIDOL (ISOVUE-370) INJECTION 76%
INTRAVENOUS | Status: AC
  Filled 2018-06-18: qty 100

## 2018-06-18 MED ORDER — IOPAMIDOL (ISOVUE-370) INJECTION 76%
100.0000 mL | Freq: Once | INTRAVENOUS | Status: AC | PRN
  Administered 2018-06-18: 100 mL via INTRAVENOUS

## 2018-06-18 NOTE — ED Notes (Signed)
Pt ambulated with pulse ox, O2 sat never falling below 96%, steady gait with minimal assistance, pulse maintained at normal levels.

## 2018-06-18 NOTE — ED Notes (Signed)
Patient transported to CT 

## 2018-06-18 NOTE — ED Provider Notes (Signed)
[facility] EMERGENCY DEPARTMENT Provider Note   CSN: [id number] Arrival date & time: 06/18/18  1952     History   Chief Complaint Chief Complaint  Patient presents with  . Chest Pain  . Shortness of Breath    HPI Edwin Gallagher is a 62 y.o. male history of hypertension, hyperlipidemia, diabetes here presenting with chest pressure, shortness of breath.  Patient states that he had intermittent chest pressure and shortness of breath for the last several weeks.  But he did have sudden onset of chest pressure that lasted 30 minutes and felt dizzy and lightheaded.  He was noted to be in new onset atrial fibrillation per EMS.  He took aspirin 324 mg and now is pain-free.  He did arrive to [facility] recently but denies any calf pain or history of blood clots.  Denies any history of coronary artery disease.  The history is provided by the patient.    Past Medical History:  Diagnosis Date  . Abnormal cardiac CT angiography 2006   only abnormal with soft plaque in LAD calcium score is0   . Abnormal LFTs   . Chest discomfort   . Diabetes mellitus   . Hyperlipidemia   . Hypertension     Patient Active Problem List   Diagnosis Date Noted  . OSA (obstructive sleep apnea) 09/24/2011  . Chest pain 05/11/2011  . Hypertension 05/11/2011  . Obesity 05/11/2011  . Hyperlipidemia 05/11/2011    Past Surgical History:  Procedure Laterality Date  . ROTATOR CUFF REPAIR  2009   Right  . TRIGGER FINGER RELEASE  02/12/2012   Procedure: MINOR RELEASE TRIGGER FINGER/A-1 PULLEY;  Surgeon: Wyn Forster., MD;  Location: [facility];  Service: Orthopedics;  Laterality: Right;  right thumb        Home Medications    Prior to Admission medications   Medication Sig Start Date End Date Taking? Authorizing Provider  amLODipine (NORVASC) 10 MG tablet Take 10 mg by mouth daily.   Yes [provider]  aspirin 81 MG tablet Take 81 mg by mouth daily.     Yes  [provider]  doxazosin (CARDURA) 4 MG tablet Take 4 mg by mouth 2 (two) times daily.   Yes [provider]  glimepiride (AMARYL) 4 MG tablet Take 4 mg by mouth 2 (two) times daily.    Yes [provider]  losartan-hydrochlorothiazide (HYZAAR) 100-25 MG per tablet Take 1 tablet by mouth daily.     Yes [provider]  metFORMIN (GLUCOPHAGE) 500 MG tablet Take 500 mg by mouth 2 (two) times daily with a meal.   Yes [provider]  metoprolol tartrate (LOPRESSOR) 100 MG tablet Take 100 mg by mouth daily. 05/07/18  Yes [provider]  simvastatin (ZOCOR) 20 MG tablet Take 20 mg by mouth daily.  06/14/17  Yes [provider]    Family History Family History  Problem Relation Age of Onset  . Hypertension Mother   . Hypertension Father   . Stroke Father   . Sudden death Sister   . Hypertension Sister   . Alcohol abuse Brother   . Heart attack Neg Hx     Social History Social History   Tobacco Use  . Smoking status: Never Smoker  . Smokeless tobacco: Never Used  Substance Use Topics  . Alcohol use: No  . Drug use: No     Allergies   Patient has no known allergies.   Review  of Systems Review of Systems  Respiratory: Positive for shortness of breath.   Cardiovascular: Positive for chest pain.  All other systems reviewed and are negative.    Physical Exam Updated Vital Signs BP 126/75   Pulse 75   Temp 98.9 F (37.2 C) (Oral)   Resp 20   Ht 6' (1.829 m)   Wt 127 kg (280 lb)   SpO2 99%   BMI 37.97 kg/m   Physical Exam  Constitutional: He appears well-developed and well-nourished.  HENT:  Head: Normocephalic.  Eyes: Pupils are equal, round, and reactive to light.  Neck: Normal range of motion.  Cardiovascular: Normal pulses.  Irregular, tachycardic   Pulmonary/Chest: Effort normal and breath sounds normal.  Abdominal: Soft. Bowel sounds are normal.  Musculoskeletal: Normal range of motion.        Right lower leg: Normal. He exhibits no tenderness and no edema.       Left lower leg: Normal. He exhibits no tenderness and no edema.  Neurological: He is alert.  Skin: Skin is warm. Capillary refill takes less than 2 seconds.  Psychiatric: He has a normal mood and affect. His behavior is normal.  Nursing note and vitals reviewed.    ED Treatments / Results  Labs (all labs ordered are listed, but only abnormal results are displayed) Labs Reviewed  CBC WITH DIFFERENTIAL/PLATELET - Abnormal; Notable for the following components:      Result Value   Hemoglobin 12.6 (*)    All other components within normal limits  COMPREHENSIVE METABOLIC PANEL - Abnormal; Notable for the following components:   Glucose, Bld 231 (*)    Creatinine, Ser 1.69 (*)    GFR calc non Af Amer 42 (*)    GFR calc Af Amer 49 (*)    All other components within normal limits  D-DIMER, QUANTITATIVE (NOT AT [facility]) - Abnormal; Notable for the following components:   D-Dimer, Quant 0.91 (*)    All other components within normal limits  I-STAT TROPONIN, ED  I-STAT TROPONIN, ED    EKG EKG Interpretation  Date/Time:  Wednesday June 18 2018 21:00:28 EDT Ventricular Rate:  80 PR Interval:    QRS Duration: 88 QT Interval:  376 QTC Calculation: 434 R Axis:   44 Text Interpretation:  Sinus rhythm Atrial premature complex Probable left atrial enlargement Minimal ST elevation, inferior leads previous rhythm was afib  Confirmed by Richardean CanalYao,  H [phone]([id number]) on 06/18/2018 10:55:11 PM   Radiology Dg Chest 2 View  Result Date: 06/18/2018 CLINICAL DATA:  62 year old male with chest tightness and shortness of breath for the past 3 hours EXAM: CHEST - 2 VIEW COMPARISON:  Prior chest x-ray 05/08/2011 FINDINGS: The lungs are clear and negative for focal airspace consolidation, pulmonary edema or suspicious pulmonary nodule. No pleural effusion or pneumothorax. Cardiac and mediastinal contours are within normal limits. No acute  fracture or lytic or blastic osseous lesions. The visualized upper abdominal bowel gas pattern is unremarkable. IMPRESSION: Negative chest x-ray. Electronically Signed   By: Malachy MoanHeath  McCullough M.D.   On: 06/18/2018 20:27   Ct Angio Chest Pe W And/or Wo Contrast  Result Date: 06/18/2018 CLINICAL DATA:  Acute onset of generalized chest pain and shortness of breath. Dizziness. EXAM: CT ANGIOGRAPHY CHEST WITH CONTRAST TECHNIQUE: Multidetector CT imaging of the chest was performed using the standard protocol during bolus administration of intravenous contrast. Multiplanar CT image reconstructions and MIPs were obtained to evaluate the vascular anatomy. CONTRAST:  100mL ISOVUE-370 IOPAMIDOL (ISOVUE-370) INJECTION 76%  COMPARISON:  Chest radiograph performed earlier today at 8:06 p.m. FINDINGS: Cardiovascular:  There is no evidence of pulmonary embolus. The heart is normal in size. The thoracic aorta is unremarkable. The great vessels are within normal limits. Mediastinum/Nodes: The mediastinum is unremarkable in appearance. No mediastinal lymphadenopathy is seen. No pericardial effusion is identified. The thyroid gland is unremarkable. No axillary lymphadenopathy is seen. Lungs/Pleura: The lungs are essentially clear bilaterally. No focal consolidation, pleural effusion or pneumothorax is seen. No masses are identified. Upper Abdomen: The visualized portions of the liver and spleen are unremarkable. The visualized portions of the gallbladder, pancreas, adrenal glands and kidneys are within normal limits. Musculoskeletal: No acute osseous abnormalities are identified. Degenerative change is noted at the glenohumeral joints, more prominent on the right. The visualized musculature is unremarkable in appearance. Review of the MIP images confirms the above findings. IMPRESSION: 1. No evidence of pulmonary embolus. 2. Lungs clear bilaterally. Electronically Signed   By: Roanna Raider M.D.   On: 06/18/2018 22:30     Procedures Procedures (including critical care time)  Medications Ordered in ED Medications  diltiazem (CARDIZEM) injection 10 mg (10 mg Intravenous Given 06/18/18 2126)  sodium chloride 0.9 % bolus 1,000 mL (1,000 mLs Intravenous New Bag/Given 06/18/18 2125)  iopamidol (ISOVUE-370) 76 % injection 100 mL (100 mLs Intravenous Contrast Given 06/18/18 2153)     Initial Impression / Assessment and Plan / ED Course  I have reviewed the triage vital signs and the nursing notes.  Pertinent labs & imaging results that were available during my care of the patient were reviewed by me and considered in my medical decision making (see chart for details).     Edwin Gallagher is a 62 y.o. male here with chest pain, SOB. Found to be in new onset afib. I think symptoms likely from new onset afib. Unclear when he got into afib as he had some occasional chest pain for the last several weeks. CHADVASC score is 2 so likely need anticoagulation. Consider PE given recent travel. Will give cardizem, get labs, D-dimer, CXR.   11:41 PM D-dimer positive. CTA chest showed no PE. Converted into sinus rhythm with one dose of cardizem. Delta trop neg. Of note, Cr 1.7 in the ED, elevated compared to baseline. Given 1 L NS bolus. His GFR is 49 so will give eliquis starter pack. Will refer to cardiology outpatient. Gave strict return precautions.    Final Clinical Impressions(s) / ED Diagnoses   Final diagnoses:  None    ED Discharge Orders    None       Charlynne Pander, MD 06/18/18 2342

## 2018-06-18 NOTE — ED Notes (Signed)
Patient transported to X-ray 

## 2018-06-18 NOTE — Discharge Instructions (Signed)
Take eliquis as prescribed. It will help prevent strokes   You have atrial fibrillation. You need to see cardiology.   Your kidney function is abnormal and needs to be rechecked in a week. Hold amaryl for 2 days   Stay hydrated   Return to ER if you have severe chest pain, trouble breathing, uncontrolled palpitations

## 2018-06-18 NOTE — ED Triage Notes (Signed)
Per EMS, pt coming from home with complaints of sudden chest pressure and SOB that last around 30 minutes and gradually got better. Pt stated he did he dizzy at first. Pt was seen to be in Afib on the monitor but pt is unaware if he has Afib. Pt is currently CP free. Pt took 324 ASA at home.

## 2018-06-23 ENCOUNTER — Ambulatory Visit (AMBULATORY_SURGERY_CENTER): Payer: Self-pay | Admitting: *Deleted

## 2018-06-23 ENCOUNTER — Telehealth (HOSPITAL_COMMUNITY): Payer: Self-pay | Admitting: *Deleted

## 2018-06-23 VITALS — Wt 284.0 lb

## 2018-06-23 DIAGNOSIS — Z1211 Encounter for screening for malignant neoplasm of colon: Secondary | ICD-10-CM

## 2018-06-23 NOTE — Telephone Encounter (Signed)
Pt was instructed to decrease Eliquis to 5 mg twice a day. Scheduled for Wednesday at 3:00 p.m.

## 2018-06-23 NOTE — Telephone Encounter (Signed)
-----   Message from Kelley M Auten, RPH sent at 06/20/2018  4:26 PM EDT ----- Regarding: RE: appropriate dose Hi ,  You are correct that is not the right dose for Afib indication. Based on his age, weight, and Scr he should be on Eliquis 5mg BID. Let me know if you need anything else for him. We might need to safety zone this one as well.   Thanks, Kelley    ----- Message ----- From: ,  D, CMA Sent: 06/20/2018   2:56 PM EDT To: Kelley M Auten, RPH, Megan E Supple, RPH Subject: appropriate dose                               Hey, this pt was started on a starter pack of Eliquis with the instruction of 2 tablets bid for 7 days then decrease to 1 tablet bid.  Is there a reason he would have been given this dose?  They did not find DVT or PE. Could you please let me know the appropriate dose he should be on before I contact the patient? Thank you    

## 2018-06-23 NOTE — Progress Notes (Signed)
Patient was seen in ED on Saturday 06/18/18 for A fib and was placed on Eliquis blood thinner. Pt states he will be on the blood thinner for 30 days and he has appointment to see cardiologist this week. Explained to him that we need to cancel the colon and PV until he gets off the blood thinner and he needs cardiac clearance from his cardiologist to have the screening colonoscopy. Pt understands. Pt will call us back to reschedule the colon when he gets the clearance.

## 2018-06-24 ENCOUNTER — Telehealth (HOSPITAL_COMMUNITY): Payer: Self-pay | Admitting: *Deleted

## 2018-06-24 NOTE — Telephone Encounter (Signed)
Pt notified, see previous note.   Safety portal done on patient behalf.

## 2018-06-24 NOTE — Telephone Encounter (Signed)
-----   Message from Levin BaconKelley M Auten, [facility] sent at 06/20/2018  4:26 PM EDT ----- Regarding: RE: appropriate dose Hi Marchelle Folks,  You are correct that is not the right dose for Afib indication. Based on his age, weight, and Scr he should be on Eliquis 5mg  BID. Let me know if you need anything else for him. We might need to safety zone this one as well.   Thanks, Nicholaus BloomKelley    ----- Message ----- From: Awilda BillBecker,  D, CMA Sent: 06/20/2018   2:56 PM EDT To: Levin BaconKelley M Auten, RPH, Awilda MetroMegan E Supple, RPH Subject: appropriate dose                               Hey, this pt was started on a starter pack of Eliquis with the instruction of 2 tablets bid for 7 days then decrease to 1 tablet bid.  Is there a reason he would have been given this dose?  They did not find DVT or PE. Could you please let me know the appropriate dose he should be on before I contact the patient? Thank you  Marchelle Folks

## 2018-06-25 ENCOUNTER — Ambulatory Visit (HOSPITAL_COMMUNITY)
Admission: RE | Admit: 2018-06-25 | Discharge: 2018-06-25 | Disposition: A | Discharge status: Home / Self Care | Payer: 59 | Source: Ambulatory Visit | Attending: Nurse Practitioner | Admitting: Nurse Practitioner

## 2018-06-25 ENCOUNTER — Encounter (HOSPITAL_COMMUNITY): Payer: Self-pay | Admitting: Nurse Practitioner

## 2018-06-25 VITALS — BP 122/68 | HR 87 | Ht 72.0 in | Wt 283.0 lb

## 2018-06-25 DIAGNOSIS — Z79899 Other long term (current) drug therapy: Secondary | ICD-10-CM | POA: Diagnosis not present

## 2018-06-25 DIAGNOSIS — I2510 Atherosclerotic heart disease of native coronary artery without angina pectoris: Secondary | ICD-10-CM | POA: Diagnosis not present

## 2018-06-25 DIAGNOSIS — E785 Hyperlipidemia, unspecified: Secondary | ICD-10-CM | POA: Diagnosis not present

## 2018-06-25 DIAGNOSIS — Z8249 Family history of ischemic heart disease and other diseases of the circulatory system: Secondary | ICD-10-CM | POA: Diagnosis not present

## 2018-06-25 DIAGNOSIS — E119 Type 2 diabetes mellitus without complications: Secondary | ICD-10-CM | POA: Insufficient documentation

## 2018-06-25 DIAGNOSIS — I10 Essential (primary) hypertension: Secondary | ICD-10-CM | POA: Insufficient documentation

## 2018-06-25 DIAGNOSIS — I480 Paroxysmal atrial fibrillation: Secondary | ICD-10-CM | POA: Diagnosis not present

## 2018-06-25 DIAGNOSIS — I491 Atrial premature depolarization: Secondary | ICD-10-CM | POA: Diagnosis not present

## 2018-06-25 DIAGNOSIS — Z7901 Long term (current) use of anticoagulants: Secondary | ICD-10-CM | POA: Diagnosis not present

## 2018-06-25 DIAGNOSIS — Z7984 Long term (current) use of oral hypoglycemic drugs: Secondary | ICD-10-CM | POA: Diagnosis not present

## 2018-06-25 MED ORDER — APIXABAN 5 MG PO TABS: 5.0000 mg | ORAL_TABLET | Freq: Two times a day (BID) | ORAL | 3 refills | Status: AC

## 2018-06-25 MED ORDER — DILTIAZEM HCL 30 MG PO TABS: ORAL_TABLET | ORAL | 1 refills | Status: DC

## 2018-06-25 NOTE — Progress Notes (Signed)
Primary Care Physician: Nila NephewGreen, Edwin, MD [facility] Phys[facility]   Edwin Gallagher is a 62 y.o. male with a h/o  hypertension, hyperlipidemia, diabetes [facility] with  chest pressure, shortness of breath.  Patient states that he had intermittent chest pressure and shortness of breath for the last several weeks.  He did have sudden onset of chest pressure that lasted 30 minutes and felt dizzy and lightheaded.  He was noted to be in new onset atrial fibrillation per EMS.  He took aspirin 324 mg and then was pain-free in the ER. [place] did travel to [place] recently but denied any calf pain or history of blood clots.  Denied any history of coronary artery disease.  He was given 10 mg IV cardizem x one dose and converted pt. He was sent out with an eliquis starter pack but when appointment was made by afib clinic, he was told to not follow the instructions on the pack but just to take 5 mg bid.   He denies any alcohol use, moderate caffeine use, has OSA and uses CPAP,obese, and no regular exercise program. Chadsvasc score of 2 for HTN/DM.  Today, he denies symptoms of palpitations, chest pain, shortness of breath, orthopnea, PND, lower extremity edema, dizziness, presyncope, syncope, or neurologic sequela. The patient is tolerating medications without difficulties and is otherwise without complaint today.   Past Medical History:  Diagnosis Date  . Abnormal cardiac CT angiography 2006   only abnormal with soft plaque in LAD calcium score is0   . Abnormal LFTs   . Chest discomfort   . Diabetes mellitus   . Hyperlipidemia   . Hypertension    Past Surgical History:  Procedure Laterality Date  . ROTATOR CUFF REPAIR  2009   Right  . TRIGGER FINGER RELEASE  02/12/2012   Procedure: MINOR RELEASE TRIGGER FINGER/A-1 PULLEY;  Surgeon: Wyn Forsterobert V Sypher Jr., MD;  Location: [facility];  Service: Orthopedics;  Laterality: Right;  right thumb    Current Outpatient Medications    Medication Sig Dispense Refill  . amLODipine (NORVASC) 10 MG tablet Take 10 mg by mouth daily.    Marland Kitchen. apixaban (ELIQUIS) 5 MG TABS tablet Take 1 tablet (5 mg total) by mouth 2 (two) times daily. 60 tablet 3  . doxazosin (CARDURA) 4 MG tablet Take 4 mg by mouth 2 (two) times daily.    Marland Kitchen. glimepiride (AMARYL) 4 MG tablet Take 4 mg by mouth 2 (two) times daily.     Marland Kitchen. losartan-hydrochlorothiazide (HYZAAR) 100-25 MG per tablet Take 1 tablet by mouth daily.      . metFORMIN (GLUCOPHAGE) 500 MG tablet Take 500 mg by mouth 2 (two) times daily with a meal.    . metoprolol tartrate (LOPRESSOR) 100 MG tablet Take 100 mg by mouth daily.    . simvastatin (ZOCOR) 20 MG tablet Take 20 mg by mouth daily.     Marland Kitchen. diltiazem (CARDIZEM) 30 MG tablet Take 1 tablet every 4 hours AS NEEDED for AFIB heart rate >100 45 tablet 1   No current facility-administered medications for this encounter.     No Known Allergies  Social History   Socioeconomic History  . Marital status: Married    Spouse name: Not on file  . Number of children: 8  . Years of education: Not on file  . Highest education level: Not on file  Occupational History    Employer: [employer]  Social Needs  . Financial resource strain:  Not on file  . Food insecurity:    Worry: Not on file    Inability: Not on file  . Transportation needs:    Medical: Not on file    Non-medical: Not on file  Tobacco Use  . Smoking status: Never Smoker  . Smokeless tobacco: Never Used  Substance and Sexual Activity  . Alcohol use: No  . Drug use: No  . Sexual activity: Not on file  Lifestyle  . Physical activity:    Days per week: Not on file    Minutes per session: Not on file  . Stress: Not on file  Relationships  . Social connections:    Talks on phone: Not on file    Gets together: Not on file    Attends religious service: Not on file    Active member of club or organization: Not on file    Attends meetings of clubs or organizations: Not on file     Relationship status: Not on file  . Intimate partner violence:    Fear of current or ex partner: Not on file    Emotionally abused: Not on file    Physically abused: Not on file    Forced sexual activity: Not on file  Other Topics Concern  . Not on file  Social History Narrative  . Not on file    Family History  Problem Relation Age of Onset  . Hypertension Mother   . Hypertension Father   . Stroke Father   . Sudden death Sister   . Hypertension Sister   . Alcohol abuse Brother   . Heart attack Neg Hx     ROS- All systems are reviewed and negative except as per the HPI above  Physical Exam: Vitals:   06/25/18 1515  BP: 122/68  Pulse: 87  Weight: 128.4 kg  Height: 6' (1.829 m)   Wt Readings from Last 3 Encounters:  06/25/18 128.4 kg  06/23/18 128.8 kg  06/18/18 127 kg    Labs: Lab Results  Component Value Date   NA 141 06/18/2018   K 3.6 06/18/2018   CL 105 06/18/2018   CO2 26 06/18/2018   GLUCOSE 231 (H) 06/18/2018   BUN 20 06/18/2018   CREATININE 1.69 (H) 06/18/2018   CALCIUM 9.2 06/18/2018   PHOS 3.3 06/16/2008   MG 1.6 06/16/2008   No results found for: INR No results found for: CHOL, HDL, LDLCALC, TRIG   GEN- The patient is well appearing, alert and oriented x 3 today.   Head- normocephalic, atraumatic Eyes-  Sclera clear, conjunctiva pink Ears- hearing intact Oropharynx- clear Neck- supple, no JVP Lymph- no cervical lymphadenopathy Lungs- Clear to ausculation bilaterally, normal work of breathing Heart- Regular rate and rhythm, no murmurs, rubs or gallops, PMI not laterally displaced GI- soft, NT, ND, + BS Extremities- no clubbing, cyanosis, or edema MS- no significant deformity or atrophy Skin- no rash or lesion Psych- euthymic mood, full affect Neuro- strength and sensation are intact  EKG- SR with PAC's, pr int 87 ms, qrs int 86 ms, qtc 469 ms    Assessment and Plan: 1. New onset paroxysmal afib In SR today, no further  episodes General education re afib Pt can stop ASA  ECHO Cut down on caffeine Cardizem 30 mg prn if afib should return Regular exercise and weigth loss encouraged Continue to use cpap  2. Chadsvasc  score of 2 Pt was incorrectly given eliquis  starter pack in the ER He has been instructed  to take Eliquis 5 mg bid   New RX sent in to pharmacy Bleeding precautions discussed   F/u in 2 weeks  Lupita LeashDonna C. Matthew Folksarroll, ANP-C [facility] [address] [place], [place] [phone]

## 2018-06-25 NOTE — Patient Instructions (Signed)
Cardizem 30mg -- take 1 tablet every 4 hours AS NEEDED for AFIB heart rate >100 as long as top blood pressure >100.  

## 2018-07-02 ENCOUNTER — Ambulatory Visit (HOSPITAL_COMMUNITY): Payer: 59

## 2018-07-04 ENCOUNTER — Ambulatory Visit (HOSPITAL_COMMUNITY)
Admission: RE | Admit: 2018-07-04 | Discharge: 2018-07-04 | Disposition: A | Discharge status: Home / Self Care | Payer: 59 | Source: Ambulatory Visit | Attending: Nurse Practitioner | Admitting: Nurse Practitioner

## 2018-07-04 DIAGNOSIS — R079 Chest pain, unspecified: Secondary | ICD-10-CM | POA: Insufficient documentation

## 2018-07-04 DIAGNOSIS — Z6838 Body mass index (BMI) 38.0-38.9, adult: Secondary | ICD-10-CM | POA: Diagnosis not present

## 2018-07-04 DIAGNOSIS — E785 Hyperlipidemia, unspecified: Secondary | ICD-10-CM | POA: Insufficient documentation

## 2018-07-04 DIAGNOSIS — I480 Paroxysmal atrial fibrillation: Secondary | ICD-10-CM | POA: Diagnosis not present

## 2018-07-04 DIAGNOSIS — E669 Obesity, unspecified: Secondary | ICD-10-CM | POA: Insufficient documentation

## 2018-07-04 DIAGNOSIS — I340 Nonrheumatic mitral (valve) insufficiency: Secondary | ICD-10-CM | POA: Insufficient documentation

## 2018-07-04 NOTE — Progress Notes (Signed)
  Echocardiogram 2D Echocardiogram has been performed.   L Androw 07/04/2018, 1:32 PM

## 2018-07-07 ENCOUNTER — Encounter: Payer: 59 | Admitting: Gastroenterology

## 2018-07-08 ENCOUNTER — Encounter (HOSPITAL_COMMUNITY): Payer: Self-pay | Admitting: Nurse Practitioner

## 2018-07-08 ENCOUNTER — Ambulatory Visit (HOSPITAL_COMMUNITY)
Admission: RE | Admit: 2018-07-08 | Discharge: 2018-07-08 | Disposition: A | Discharge status: Home / Self Care | Payer: 59 | Source: Ambulatory Visit | Attending: Nurse Practitioner | Admitting: Nurse Practitioner

## 2018-07-08 VITALS — BP 132/82 | HR 75 | Ht 72.0 in | Wt 278.0 lb

## 2018-07-08 DIAGNOSIS — Z7984 Long term (current) use of oral hypoglycemic drugs: Secondary | ICD-10-CM | POA: Diagnosis not present

## 2018-07-08 DIAGNOSIS — R0609 Other forms of dyspnea: Secondary | ICD-10-CM | POA: Insufficient documentation

## 2018-07-08 DIAGNOSIS — I491 Atrial premature depolarization: Secondary | ICD-10-CM | POA: Diagnosis not present

## 2018-07-08 DIAGNOSIS — Z79899 Other long term (current) drug therapy: Secondary | ICD-10-CM | POA: Insufficient documentation

## 2018-07-08 DIAGNOSIS — I2510 Atherosclerotic heart disease of native coronary artery without angina pectoris: Secondary | ICD-10-CM | POA: Insufficient documentation

## 2018-07-08 DIAGNOSIS — I480 Paroxysmal atrial fibrillation: Secondary | ICD-10-CM | POA: Diagnosis not present

## 2018-07-08 DIAGNOSIS — E785 Hyperlipidemia, unspecified: Secondary | ICD-10-CM | POA: Diagnosis not present

## 2018-07-08 DIAGNOSIS — I10 Essential (primary) hypertension: Secondary | ICD-10-CM | POA: Insufficient documentation

## 2018-07-08 DIAGNOSIS — E119 Type 2 diabetes mellitus without complications: Secondary | ICD-10-CM | POA: Diagnosis not present

## 2018-07-08 DIAGNOSIS — R0789 Other chest pain: Secondary | ICD-10-CM | POA: Insufficient documentation

## 2018-07-08 NOTE — Progress Notes (Signed)
Primary Care Physician: Nila Nephew, MD Referring Physician: [facility] ER f/u   Edwin Gallagher is a 62 y.o. male with a h/o  hypertension, hyperlipidemia, diabetes presented to [facility] ER f/u with  chest pressure, shortness of breath.  Patient states that he had intermittent chest pressure and shortness of breath for the last several weeks.  He did have sudden onset of chest pressure that lasted 30 minutes and felt dizzy and lightheaded.  He was noted to be in new onset atrial fibrillation per EMS.  He took aspirin 324 mg and then was pain-free in the ER. [place] did [place] recently but denied any calf pain or history of blood clots.  Denied any history of coronary artery disease.  He was given 10 mg IV cardizem x one dose and converted pt. He was sent out with an eliquis starter pack but when appointment was made by afib clinic, he was told to not follow the instructions on the pack but just to take 5 mg bid.   He denies any alcohol use, moderate caffeine use, has OSA and uses CPAP,obese, and no regular exercise program. Chadsvasc score of 2 for HTN/DM.  F/u in afib clinic. Echo reviewed with pt, mild LVH, mild MR, mild left atrial enlargement. He has not had any issues with recurrent afib or bleeding issues form eliquis.  Today, he denies symptoms of palpitations, chest pain, shortness of breath, orthopnea, PND, lower extremity edema, dizziness, presyncope, syncope, or neurologic sequela. The patient is tolerating medications without difficulties and is otherwise without complaint today.   Past Medical History:  Diagnosis Date  . Abnormal cardiac CT angiography 2006   only abnormal with soft plaque in LAD calcium score is0   . Abnormal LFTs   . Chest discomfort   . Diabetes mellitus   . Hyperlipidemia   . Hypertension    Past Surgical History:  Procedure Laterality Date  . ROTATOR CUFF REPAIR  2009   Right  . TRIGGER FINGER RELEASE  02/12/2012   Procedure: MINOR RELEASE TRIGGER FINGER/A-1  PULLEY;  Surgeon: Wyn Forster., MD;  Location: [facility];  Service: Orthopedics;  Laterality: Right;  right thumb    Current Outpatient Medications  Medication Sig Dispense Refill  . amLODipine (NORVASC) 10 MG tablet Take 10 mg by mouth daily.    Marland Kitchen apixaban (ELIQUIS) 5 MG TABS tablet Take 1 tablet (5 mg total) by mouth 2 (two) times daily. 60 tablet 3  . diltiazem (CARDIZEM) 30 MG tablet Take 1 tablet every 4 hours AS NEEDED for AFIB heart rate >100 45 tablet 1  . doxazosin (CARDURA) 4 MG tablet Take 4 mg by mouth 2 (two) times daily.    Marland Kitchen glimepiride (AMARYL) 4 MG tablet Take 4 mg by mouth 2 (two) times daily.     Marland Kitchen losartan-hydrochlorothiazide (HYZAAR) 100-25 MG per tablet Take 1 tablet by mouth daily.      . metFORMIN (GLUCOPHAGE) 500 MG tablet Take 500 mg by mouth 2 (two) times daily with a meal.    . metoprolol tartrate (LOPRESSOR) 100 MG tablet Take 100 mg by mouth daily.    . simvastatin (ZOCOR) 20 MG tablet Take 20 mg by mouth daily.      No current facility-administered medications for this encounter.     No Known Allergies  Social History   Socioeconomic History  . Marital status: Married    Spouse name: Not on file  . Number of children: 8  .  Years of education: Not on file  . Highest education level: Not on file  Occupational History    Employer: [employer]  Social Needs  . Financial resource strain: Not on file  . Food insecurity:    Worry: Not on file    Inability: Not on file  . Transportation needs:    Medical: Not on file    Non-medical: Not on file  Tobacco Use  . Smoking status: Never Smoker  . Smokeless tobacco: Never Used  Substance and Sexual Activity  . Alcohol use: No  . Drug use: No  . Sexual activity: Not on file  Lifestyle  . Physical activity:    Days per week: Not on file    Minutes per session: Not on file  . Stress: Not on file  Relationships  . Social connections:    Talks on phone: Not on file    Gets together: Not  on file    Attends religious service: Not on file    Active member of club or organization: Not on file    Attends meetings of clubs or organizations: Not on file    Relationship status: Not on file  . Intimate partner violence:    Fear of current or ex partner: Not on file    Emotionally abused: Not on file    Physically abused: Not on file    Forced sexual activity: Not on file  Other Topics Concern  . Not on file  Social History Narrative  . Not on file    Family History  Problem Relation Age of Onset  . Hypertension Mother   . Hypertension Father   . Stroke Father   . Sudden death Sister   . Hypertension Sister   . Alcohol abuse Brother   . Heart attack Neg Hx     ROS- All systems are reviewed and negative except as per the HPI above  Physical Exam: Vitals:   07/08/18 1130  BP: 132/82  Pulse: 75  Weight: 126.1 kg  Height: 6' (1.829 m)   Wt Readings from Last 3 Encounters:  07/08/18 126.1 kg  06/25/18 128.4 kg  06/23/18 128.8 kg    Labs: Lab Results  Component Value Date   NA 141 06/18/2018   K 3.6 06/18/2018   CL 105 06/18/2018   CO2 26 06/18/2018   GLUCOSE 231 (H) 06/18/2018   BUN 20 06/18/2018   CREATININE 1.69 (H) 06/18/2018   CALCIUM 9.2 06/18/2018   PHOS 3.3 06/16/2008   MG 1.6 06/16/2008   No results found for: INR No results found for: CHOL, HDL, LDLCALC, TRIG   GEN- The patient is well appearing, alert and oriented x 3 today.   Head- normocephalic, atraumatic Eyes-  Sclera clear, conjunctiva pink Ears- hearing intact Oropharynx- clear Neck- supple, no JVP Lymph- no cervical lymphadenopathy Lungs- Clear to ausculation bilaterally, normal work of breathing Heart- Regular rate and rhythm, no murmurs, rubs or gallops, PMI not laterally displaced GI- soft, NT, ND, + BS Extremities- no clubbing, cyanosis, or edema MS- no significant deformity or atrophy Skin- no rash or lesion Psych- euthymic mood, full affect Neuro- strength and  sensation are intact  EKG- SR with PAC's, pr int 146 ms, qrs int 84 ms, qtc 446 ms Echo-- Left ventricle: The cavity size was normal. There was mild   concentric hypertrophy. Systolic function was normal. The   estimated ejection fraction was in the range of 60% to 65%. Wall   motion was normal; there were  no regional wall motion   abnormalities. Doppler parameters are consistent with abnormal   left ventricular relaxation (grade 1 diastolic dysfunction).   There was no evidence of elevated ventricular filling pressure by   Doppler parameters. - Aortic valve: There was no regurgitation. - Mitral valve: There was mild regurgitation. Valve area by   pressure half-time: 2 cm^2. - Left atrium: The atrium was mildly dilated. - Right ventricle: The cavity size was normal. Wall thickness was   normal. Systolic function was normal. - Right atrium: The atrium was normal in size. - Tricuspid valve: There was no regurgitation. - Pulmonary arteries: Systolic pressure was within the normal   range. - Inferior vena cava: The vessel was normal in size. - Pericardium, extracardiac: There was no pericardial effusion.    Assessment and Plan: 1. New onset paroxysmal afib In SR today, no further episodes General education re afib Pt can stop ASA  Echo reviewed with pt Cardizem 30 mg prn if afib should return Regular exercise and weight   loss encouraged, has lost 3 lbs in the last 2 weeks Continue to use cpap  2. Chadsvasc  score of 2 Continue Eliquis 5 mg bid   Bleeding precautions discussed   F/u request f/u with cardiology in 3 months to ge established  Lupita LeashDonna C. Matthew Folksarroll, ANP-C [facility] [address] [place], [place] [address] [phone]

## 2018-10-20 ENCOUNTER — Ambulatory Visit: Payer: 59 | Admitting: Physician Assistant

## 2018-10-26 ENCOUNTER — Encounter: Payer: Self-pay | Admitting: Physician Assistant

## 2018-10-26 NOTE — Progress Notes (Signed)
Cardiology Office Note    Date:  10/28/2018  ID:  Edwin CalLee Bass, DOB 12-Jan-1956, MRN [id number] PCP:  Nila NephewGreen, Edwin, MD  Cardiologist:  Tobias AlexanderKatarina Nelson, MD   Chief Complaint: f/u afib, CAD  History of Present Illness:  Edwin Gallagher is a 62 y.o. male with history of HTN, HLD, DM, paroxysmal atrial fib (followed by afib clinic), mild plaque on cardiac CT 2006, OSA (followed by pulm), obesity, renal insufficiency by prior labs who presents for 6 month general cardiology follow-up.  He has seen Dr. Riley KillStuckey in the past and had cardiac CT 2006 with calcium score of 0. He had soft plaque in LAD of about 50%.  He had a normal stress echo in 2012. He also had a normal nuc in 2016. In 06/2018 he was seen in the ER with intermittent chest pressure and SOB for several weeks, found to be in new onset atrial fib. CT[place] was negative for PE otherwise clear. Troponin was negative. He was also found to have AKI with Cr 1.69 (previously 1.23 in 2016). He was given fluids and converted to NSR with one dose of diltiazem. He was started on Eliquis and has since been followed by afib clinic. Last echo 06/2018 showed EF 60-65%, grade 1 DD, no evidence of elevated LV filling pressures, mild MR. No recent lipids on file.  He returns for follow-up overall doing well. Since 06/2018 he's had 2 episodes like prior AF requiring administration of diltiazem. Interestingly he says the episodes only lasted a minute max and resolved within seconds of taking the PRN diltiazem. He otherwise denies any CP, SOB, palpitations, syncope, edema, orthopnea. Initial BP by tech was 112/70. He requested recheck as it's not usually as low, since he takes his BP meds around 3pm. Recheck by me was 134/74.  Past Medical History:  Diagnosis Date  . Abnormal cardiac CT angiography 2006   only abnormal with soft plaque in LAD calcium score is0   . Abnormal LFTs   . CAD (coronary artery disease)    a. has seen Dr. Riley KillStuckey in the  past and had cardiac CT 2006 with calcium score of 0. He had soft plaque in LAD of about 50%.  He had a normal stress echo in 2012. He also had a normal nuc in 2016.  Marland Kitchen. Chest discomfort   . Diabetes mellitus   . Hyperlipidemia   . Hypertension   . Obesity   . OSA (obstructive sleep apnea)   . Paroxysmal atrial fibrillation [facility](HCC)     Past Surgical History:  Procedure Laterality Date  . ROTATOR CUFF REPAIR  2009   Right  . TRIGGER FINGER RELEASE  02/12/2012   Procedure: MINOR RELEASE TRIGGER FINGER/A-1 PULLEY;  Surgeon: Wyn Forsterobert V Sypher Jr., MD;  Location: [facility];  Service: Orthopedics;  Laterality: Right;  right thumb    Current Medications: Current Meds  Medication Sig  . amLODipine (NORVASC) 10 MG tablet Take 10 mg by mouth daily.  Marland Kitchen. apixaban (ELIQUIS) 5 MG TABS tablet Take 1 tablet (5 mg total) by mouth 2 (two) times daily.  Marland Kitchen. diltiazem (CARDIZEM) 30 MG tablet Take 1 tablet every 4 hours AS NEEDED for AFIB heart rate >100  . doxazosin (CARDURA) 4 MG tablet Take 4 mg by mouth 2 (two) times daily.  Marland Kitchen. glimepiride (AMARYL) 4 MG tablet Take 4 mg by mouth 2 (two) times daily.   Marland Kitchen. losartan-hydrochlorothiazide (HYZAAR) 100-25 MG per tablet Take 1 tablet by  mouth daily.    . metFORMIN (GLUCOPHAGE) 500 MG tablet Take 500 mg by mouth 2 (two) times daily with a meal.  . metoprolol tartrate (LOPRESSOR) 100 MG tablet Take 100 mg by mouth daily.  . simvastatin (ZOCOR) 20 MG tablet Take 20 mg by mouth daily.   . traMADol (ULTRAM) 50 MG tablet Take 50 mg by mouth as needed.    Allergies:   Patient has no known allergies.   Social History   Socioeconomic History  . Marital status: Married    Spouse name: Not on file  . Number of children: 8  . Years of education: Not on file  . Highest education level: Not on file  Occupational History    Employer: [employer]  Social Needs  . Financial resource strain: Not on file  . Food insecurity:    Worry: Not on file    Inability: Not  on file  . Transportation needs:    Medical: Not on file    Non-medical: Not on file  Tobacco Use  . Smoking status: Never Smoker  . Smokeless tobacco: Never Used  Substance and Sexual Activity  . Alcohol use: No  . Drug use: No  . Sexual activity: Not on file  Lifestyle  . Physical activity:    Days per week: Not on file    Minutes per session: Not on file  . Stress: Not on file  Relationships  . Social connections:    Talks on phone: Not on file    Gets together: Not on file    Attends religious service: Not on file    Active member of club or organization: Not on file    Attends meetings of clubs or organizations: Not on file    Relationship status: Not on file  Other Topics Concern  . Not on file  Social History Narrative  . Not on file     Family History:  The patient's family history includes Alcohol abuse in his brother; Hypertension in his father, mother, and sister; Stroke in his father; Sudden death in his sister. There is no history of Heart attack.  ROS:   Please see the history of present illness.  All other systems are reviewed and otherwise negative.    PHYSICAL EXAM:   VS:  BP 112/70   Pulse 71   Ht 6' (1.829 m)   Wt 276 lb (125.2 kg)   SpO2 95%   BMI 37.43 kg/m   BMI: Body mass index is 37.43 kg/m. GEN: Well nourished, well developed obese AAM, in no acute distress HEENT: normocephalic, atraumatic Neck: no JVD, carotid bruits, or masses Cardiac: RRR; no murmurs, rubs, or gallops, no edema  Respiratory:  clear to auscultation bilaterally, normal work of breathing GI: soft, nontender, nondistended, + BS MS: no deformity or atrophy Skin: warm and dry, no rash Neuro:  Alert and Oriented x 3, Strength and sensation are intact, follows commands Psych: euthymic mood, full affect  Wt Readings from Last 3 Encounters:  10/28/18 276 lb (125.2 kg)  07/08/18 278 lb (126.1 kg)  06/25/18 283 lb (128.4 kg)      Studies/Labs Reviewed:   EKG:  EKG was  ordered today and personally reviewed by me and demonstrates NSR 71bpm no acute ST-T changes. Baseline wanderdue to breathing. Tech tried to repeat, same.0  Recent Labs: 06/18/2018: ALT 32; BUN 20; Creatinine, Ser 1.69; Hemoglobin 12.6; Platelets 156; Potassium 3.6; Sodium 141   Lipid Panel No results found for: CHOL, TRIG, HDL,  CHOLHDL, VLDL, LDLCALC, LDLDIRECT  Additional studies/ records that were reviewed today include: Summarized above   ASSESSMENT & PLAN:   1. Paroxysmal atrial fib - maintaining NSR with only rare shortlived breakthrough episodes. He has a hard time describing them. Otherwise he feels he's been doing well so will continue present regimen. His Lopressor is only once a day for unclear reasons. Will change this to Toprol which may afford more comprehensive 24-hour control of breakthrough arrhythmia. Should he develop more frequent episodes, would consider changing amlodipine to standing extended-release diltiazem. We also discussed impact of weight on recurrent atrial fib. He is interested in referral to [facility] so will refer. He's already begun making lifestyle changes with his wife so I think he'd be a good candidate for the program. He also needs a screening TSH given dx of afib, so will obtain today along with f/u lytes, creatinine and blood count. 2. CAD - no recent anginal sx. Adjust metoprolol as above. Continue statin. Check CMET/lipids today. If LDL is suboptimally controlled would suggest he discuss statin titration with primary care given concomitant diabetes. 3. Essential HTN - BP 112 by tech, recheck by me 134. Change metoprolol as above. The patient was instructed to monitor their blood pressure at home and to call if tending to run higher than 130 systolic or 80 diastolic. 4. Hyperlipidemia - check CMET/lipids today. 5. Mild mitral regurgitation - anticipate repeat echo would be due in 3-5 years to follow. 6. Renal insufficiency -  suspect some baseline CKD based on Cr from 2016. Recheck today.  Disposition: F/u with Dr. Delton See in 6 months.  Medication Adjustments/Labs and Tests Ordered: Current medicines are reviewed at length with the patient today.  Concerns regarding medicines are outlined above. Medication changes, Labs and Tests ordered today are summarized above and listed in the Patient Instructions accessible in Encounters.   Signed, Laurann Montana, PA-C  10/28/2018 11:03 AM    [facility] [address] [place], [place], [place]  [address] Phone: [phone]; Fax: [phone]

## 2018-10-28 ENCOUNTER — Ambulatory Visit (INDEPENDENT_AMBULATORY_CARE_PROVIDER_SITE_OTHER): Payer: 59 | Admitting: Physician Assistant

## 2018-10-28 ENCOUNTER — Encounter: Payer: Self-pay | Admitting: Physician Assistant

## 2018-10-28 VITALS — BP 112/70 | HR 71 | Ht 72.0 in | Wt 276.0 lb

## 2018-10-28 DIAGNOSIS — E785 Hyperlipidemia, unspecified: Secondary | ICD-10-CM | POA: Diagnosis not present

## 2018-10-28 DIAGNOSIS — I480 Paroxysmal atrial fibrillation: Secondary | ICD-10-CM

## 2018-10-28 DIAGNOSIS — N289 Disorder of kidney and ureter, unspecified: Secondary | ICD-10-CM

## 2018-10-28 DIAGNOSIS — I2510 Atherosclerotic heart disease of native coronary artery without angina pectoris: Secondary | ICD-10-CM | POA: Diagnosis not present

## 2018-10-28 DIAGNOSIS — I340 Nonrheumatic mitral (valve) insufficiency: Secondary | ICD-10-CM

## 2018-10-28 DIAGNOSIS — I10 Essential (primary) hypertension: Secondary | ICD-10-CM | POA: Diagnosis not present

## 2018-10-28 LAB — COMPREHENSIVE METABOLIC PANEL
ALT: 21 IU/L (ref 0–44)
AST: 25 IU/L (ref 0–40)
Albumin/Globulin Ratio: 1.4 (ref 1.2–2.2)
Albumin: 4.4 g/dL (ref 3.6–4.8)
Alkaline Phosphatase: 79 IU/L (ref 39–117)
BUN/Creatinine Ratio: 10 (ref 10–24)
BUN: 13 mg/dL (ref 8–27)
Bilirubin Total: 0.7 mg/dL (ref 0.0–1.2)
CO2: 24 mmol/L (ref 20–29)
Calcium: 9.5 mg/dL (ref 8.6–10.2)
Chloride: 101 mmol/L (ref 96–106)
Creatinine, Ser: 1.34 mg/dL — ABNORMAL HIGH (ref 0.76–1.27)
GFR calc Af Amer: 66 mL/min/{1.73_m2} (ref >59)
GFR calc non Af Amer: 57 mL/min/{1.73_m2} — ABNORMAL LOW (ref >59)
Globulin, Total: 3.2 g/dL (ref 1.5–4.5)
Glucose: 115 mg/dL — ABNORMAL HIGH (ref 65–99)
Potassium: 3.6 mmol/L (ref 3.5–5.2)
Sodium: 141 mmol/L (ref 134–144)
Total Protein: 7.6 g/dL (ref 6.0–8.5)

## 2018-10-28 LAB — CBC
Hematocrit: 36.6 % — ABNORMAL LOW (ref 37.5–51.0)
Hemoglobin: 12.4 g/dL — ABNORMAL LOW (ref 13.0–17.7)
MCH: 29.7 pg (ref 26.6–33.0)
MCHC: 33.9 g/dL (ref 31.5–35.7)
MCV: 88 fL (ref 79–97)
Platelets: 161 10*3/uL (ref 150–450)
RBC: 4.18 x10E6/uL (ref 4.14–5.80)
RDW: 13.2 % (ref 12.3–15.4)
WBC: 6 10*3/uL (ref 3.4–10.8)

## 2018-10-28 LAB — LIPID PANEL
Chol/HDL Ratio: 2.5 ratio (ref 0.0–5.0)
Cholesterol, Total: 113 mg/dL (ref 100–199)
HDL: 46 mg/dL (ref >39)
LDL Calculated: 50 mg/dL (ref 0–99)
Triglycerides: 83 mg/dL (ref 0–149)
VLDL Cholesterol Cal: 17 mg/dL (ref 5–40)

## 2018-10-28 LAB — TSH: TSH: 1.12 u[IU]/mL (ref 0.450–4.500)

## 2018-10-28 MED ORDER — METOPROLOL SUCCINATE ER 100 MG PO TB24: 100.0000 mg | ORAL_TABLET | Freq: Every day | ORAL | 3 refills | Status: DC

## 2018-10-28 NOTE — Patient Instructions (Addendum)
Medication Instructions:  Your physician has recommended you make the following change in your medication:  1.  STOP the Lopressor 2.  START Metoprolol XL 100 mg taking 1 tablet daily  If you need a refill on your cardiac medications before your next appointment, please call your pharmacy.   Lab work: TODAY:  CMET, TSH, LIPID, & CBC  If you have labs (blood work) drawn today and your tests are completely normal, you will receive your results only by: Marland Kitchen. MyChart Message (if you have MyChart) OR . A paper copy in the mail If you have any lab test that is abnormal or we need to change your treatment, we will call you to review the results.  Testing/Procedures: None ordered   You have been referred to [facility].  THEY WILL CONTACT YOU WITH AN APPOINTMENT  Follow-Up: At [facility], you and your health needs are our priority.  As part of our continuing mission to provide you with exceptional heart care, we have created designated Provider Care Teams.  These Care Teams include your primary Cardiologist (physician) and Advanced Practice Providers (APPs -  Physician Assistants and Nurse Practitioners) who all work together to provide you with the care you need, when you need it. You will need a follow up appointment in 6 months.  Please call our office 2 months in advance to schedule this appointment.  You may see Tobias AlexanderKatarina Nelson, MD or one of the following Advanced Practice Providers on your designated Care Team:   [place]ittainy Simmons, PA-C Ronie Spiesayna Dunn, PA-C . Jacolyn ReedyMichele Lenze, PA-C  Any Other Special Instructions Will Be Listed Below (If Applicable).  Please monitor your blood pressure occasionally at home. Call our office if you tend to get readings of greater than 130 on the top number or 80 on the bottom number.

## 2019-01-03 ENCOUNTER — Encounter: Payer: Self-pay | Admitting: Gastroenterology

## 2019-01-05 ENCOUNTER — Encounter: Payer: Self-pay | Admitting: Internal Medicine

## 2019-01-06 ENCOUNTER — Ambulatory Visit (INDEPENDENT_AMBULATORY_CARE_PROVIDER_SITE_OTHER): Payer: 59 | Admitting: Internal Medicine

## 2019-01-06 ENCOUNTER — Encounter: Payer: Self-pay | Admitting: Internal Medicine

## 2019-01-06 VITALS — BP 140/84 | HR 74 | Ht 72.0 in | Wt 275.0 lb

## 2019-01-06 DIAGNOSIS — G4733 Obstructive sleep apnea (adult) (pediatric): Secondary | ICD-10-CM | POA: Diagnosis not present

## 2019-01-06 DIAGNOSIS — I480 Paroxysmal atrial fibrillation: Secondary | ICD-10-CM | POA: Diagnosis not present

## 2019-01-06 NOTE — Assessment & Plan Note (Signed)
He benefits from CPAP when used appropriately.  Educated on compliance goals. Plan-reduce CPAP to auto 5-12

## 2019-01-06 NOTE — Assessment & Plan Note (Signed)
By cardiology and currently in sinus rhythm by exam.  We discussed relationship to OSA.

## 2019-01-06 NOTE — Patient Instructions (Addendum)
Order- DME Christoper Allegra- please change CPAP auto range to 5-12, continue mask of choice, humidifier, supplies, AirView/ card  It is much better protection for your heart if you will use CPAP all night, every night- at a minimum at least 4 hours on a least 6 nights/ week.  Order- [facility] to [facility] for [facility] fitting  Please call if we can help

## 2019-01-06 NOTE — Progress Notes (Signed)
HPI male never smoker followed for OSA, complicated by obesity, DM 2, HBP, PAFib NPSG 2012 AHI 85/ hr, CPAP titrated to 14 HST-09/24/17-AHI 20.9/hour, desaturation to 84%, body weight 289 pounds  -------------------------------------- 01/06/18-63 year old male never smoker followed for OSA, complicated by obesity, DM 2, HBP HST-09/24/17-AHI 20.9/hour, desaturation to 84%, body weight 289 pounds CPAP auto 5-20/ [facility]>> today auto 5-15 ----OSA.DME-[facility].CPAP pillow cushions working well. Pr. strong at times. Download compliance 87%, AHI 1.3/hour Missed 3 days CPAP with bad cold, but otherwise sleeps better and comfortable with CPAP. Discussed pressure range.   01/06/2019- 63 year old male never smoker followed for OSA, complicated by obesity, DM 2, HBP, PAFib HST-09/24/17-AHI 20.9/hour, desaturation to 84%, body weight 289 pounds CPAP auto 5-15/[facility] Download 27% compliance AHI 2.3/hour. -----Just recently went on a trip where he did not wear his cpap. Since last here he has developed paroxysmal A. fib.  I discussed the relationship between inadequately treated OSA and A. fib and reviewed his compliance download with him. He would like pressure a little lower.  Using nasal pillows. We discussed how to clean his machine. CT a chest-06/18/2018- 1. No evidence of pulmonary embolus. 2. Lungs clear bilaterally.  ROS-see HPI   + = positive Constitutional:    weight loss, night sweats, fevers, chills, fatigue, lassitude. HEENT:    headaches, difficulty swallowing, tooth/dental problems, sore throat,       sneezing, itching, ear ache, nasal congestion, post nasal drip, snoring CV:    chest pain, orthopnea, PND, swelling in lower extremities, anasarca,                                                          dizziness, palpitations Resp:   shortness of breath with exertion or at rest.                productive cough,   non-productive cough, coughing up of blood.              change  in color of mucus.  wheezing.    Skin:    rash or lesions. GI:  No-   heartburn, indigestion, abdominal pain, nausea, vomiting, diarrhea,                 change in bowel habits, loss of appetite GU: dysuria, change in color of urine, no urgency or frequency.   flank pain. MS:   joint pain, stiffness, decreased range of motion, back pain. Neuro-     nothing unusual Psych:  change in mood or affect.  depression or anxiety.   memory loss.  OBJ- Physical Exam General- Alert, Oriented, Affect-appropriate, Distress- none acute, + Obese Skin- rash-none, lesions- none, excoriation- none Lymphadenopathy- none Head- atraumatic            Eyes- Gross vision intact, PERRLA, conjunctivae and secretions clear            Ears- Hearing, canals-normal            Nose- Clear, no-Septal dev, mucus, polyps, erosion, perforation             Throat- Mallampati III , mucosa clear , drainage- none, tonsils- atrophic Neck- flexible , trachea midline, no stridor , thyroid nl, carotid no bruit Chest - symmetrical excursion , unlabored  Heart/CV- RRR at this visit , no murmur , no gallop  , no rub, nl s1 s2                           - JVD- none , edema- none, stasis changes- none, varices- none           Lung- clear to P&A, wheeze- none, cough- none , dullness-none, rub- none           Chest wall-  Abd-  Br/ Gen/ Rectal- Not done, not indicated Extrem- cyanosis- none, clubbing, none, atrophy- none, strength- nl Neuro- grossly intact to observation

## 2019-01-19 ENCOUNTER — Ambulatory Visit (HOSPITAL_BASED_OUTPATIENT_CLINIC_OR_DEPARTMENT_OTHER): Payer: 59 | Attending: Internal Medicine | Admitting: Radiology

## 2019-01-19 DIAGNOSIS — G4733 Obstructive sleep apnea (adult) (pediatric): Secondary | ICD-10-CM

## 2019-06-24 ENCOUNTER — Ambulatory Visit (INDEPENDENT_AMBULATORY_CARE_PROVIDER_SITE_OTHER): Payer: 59 | Admitting: Internal Medicine

## 2019-06-24 ENCOUNTER — Other Ambulatory Visit: Payer: Self-pay

## 2019-06-24 ENCOUNTER — Encounter: Payer: Self-pay | Admitting: Internal Medicine

## 2019-06-24 DIAGNOSIS — G4733 Obstructive sleep apnea (adult) (pediatric): Secondary | ICD-10-CM | POA: Diagnosis not present

## 2019-06-24 NOTE — Progress Notes (Signed)
HPI male never smoker followed for OSA, complicated by obesity, DM 2, HBP, PAFib NPSG 2012 AHI 85/ hr, CPAP titrated to 14 HST-09/24/17-AHI 20.9/hour, desaturation to 84%, body weight 289 pounds  --------------------------------------  01/06/2019- 63 year old male never smoker followed for OSA, complicated by obesity, DM 2, HBP, PAFib HST-09/24/17-AHI 20.9/hour, desaturation to 84%, body weight 289 pounds CPAP auto 5-15/[facility] Download 27% compliance AHI 2.3/hour. -----Just recently went on a trip where he did not wear his cpap. Since last here he has developed paroxysmal A. fib.  I discussed the relationship between inadequately treated OSA and A. fib and reviewed his compliance download with him. He would like pressure a little lower.  Using nasal pillows. We discussed how to clean his machine. CT a chest-06/18/2018- 1. No evidence of pulmonary embolus. 2. Lungs clear bilaterally.  06/24/2019-  63 year old male never smoker followed for OSA, complicated by obesity, DM 2, HBP, PAFib CPAP auto 5-12/ [facility] Download compliance 100%, AHI 1.5/ hr Body weight today 272 lbs Mask desensitization> nasal pillows Respironics Nuance Pro Gel w large pillows. -----OSA on CPAP Auto 5-12, DME: [facility]; no complaints, using it every night Frequent nocturia due to meds. Gets back to sleep quickly. Occ needs tramadol for back pain.   ROS-see HPI   + = positive Constitutional:    weight loss, night sweats, fevers, chills, fatigue, lassitude. HEENT:    headaches, difficulty swallowing, tooth/dental problems, sore throat,       sneezing, itching, ear ache, nasal congestion, post nasal drip, snoring CV:    chest pain, orthopnea, PND, swelling in lower extremities, anasarca,                                                          dizziness, palpitations Resp:   shortness of breath with exertion or at rest.                productive cough,   non-productive cough, coughing up of blood.              change in  color of mucus.  wheezing.    Skin:    rash or lesions. GI:  No-   heartburn, indigestion, abdominal pain, nausea, vomiting, diarrhea,                 change in bowel habits, loss of appetite GU: dysuria, change in color of urine, + nocturia.   flank pain. MS:   joint pain, stiffness, decreased range of motion,+ back pain. Neuro-     nothing unusual Psych:  change in mood or affect.  depression or anxiety.   memory loss.  OBJ- Physical Exam General- Alert, Oriented, Affect-appropriate, Distress- none acute, + Obese Skin- rash-none, lesions- none, excoriation- none Lymphadenopathy- none Head- atraumatic            Eyes- Gross vision intact, PERRLA, conjunctivae and secretions clear            Ears- Hearing, canals-normal            Nose- Clear, no-Septal dev, mucus, polyps, erosion, perforation             Throat- Mallampati III , mucosa clear , drainage- none, tonsils- atrophic Neck- flexible , trachea midline, no stridor , thyroid nl, carotid no bruit Chest - symmetrical excursion , unlabored  Heart/CV- RRR at this visit , no murmur , no gallop  , no rub, nl s1 s2                           - JVD- none , edema- none, stasis changes- none, varices- none           Lung- clear to P&A, wheeze- none, cough- none , dullness-none, rub- none           Chest wall-  Abd-  Br/ Gen/ Rectal- Not done, not indicated Extrem- cyanosis- none, clubbing, none, atrophy- none, strength- nl Neuro- grossly intact to observation

## 2019-06-24 NOTE — Patient Instructions (Addendum)
We can continue CPAP auto 5-12, mask of choice, humidifier, supplies, AirView/ card  Please call if we can help 

## 2019-06-28 NOTE — Assessment & Plan Note (Signed)
Seeking to motivate him to try for some weight loss.

## 2019-06-28 NOTE — Assessment & Plan Note (Signed)
Benefits from CPAP with good compliance and control now after mask fitting Plan - continue CPAP auto 5-12

## 2019-07-07 ENCOUNTER — Ambulatory Visit: Payer: 59 | Admitting: Internal Medicine

## 2019-07-14 ENCOUNTER — Other Ambulatory Visit (HOSPITAL_COMMUNITY): Payer: Self-pay | Admitting: Nurse Practitioner

## 2019-10-09 ENCOUNTER — Other Ambulatory Visit: Payer: Self-pay | Admitting: Physician Assistant

## 2019-10-26 ENCOUNTER — Other Ambulatory Visit: Payer: Self-pay | Admitting: Internal Medicine

## 2019-10-26 ENCOUNTER — Ambulatory Visit
Admission: RE | Admit: 2019-10-26 | Discharge: 2019-10-26 | Disposition: A | Discharge status: Home / Self Care | Payer: Managed Care, Other (non HMO) | Source: Ambulatory Visit | Attending: Internal Medicine | Admitting: Internal Medicine

## 2019-10-26 ENCOUNTER — Other Ambulatory Visit: Payer: Self-pay

## 2019-10-26 DIAGNOSIS — M79671 Pain in right foot: Secondary | ICD-10-CM

## 2020-01-07 ENCOUNTER — Other Ambulatory Visit: Payer: Self-pay | Admitting: Physician Assistant

## 2020-03-25 ENCOUNTER — Encounter: Payer: Self-pay | Admitting: Gastroenterology

## 2020-04-04 ENCOUNTER — Ambulatory Visit (INDEPENDENT_AMBULATORY_CARE_PROVIDER_SITE_OTHER): Payer: 59 | Admitting: Podiatry

## 2020-04-04 ENCOUNTER — Encounter: Payer: Self-pay | Admitting: Podiatry

## 2020-04-04 ENCOUNTER — Other Ambulatory Visit: Payer: Self-pay

## 2020-04-04 VITALS — Temp 97.6°F

## 2020-04-04 DIAGNOSIS — B351 Tinea unguium: Secondary | ICD-10-CM | POA: Diagnosis not present

## 2020-04-04 DIAGNOSIS — E1149 Type 2 diabetes mellitus with other diabetic neurological complication: Secondary | ICD-10-CM

## 2020-04-04 DIAGNOSIS — M79674 Pain in right toe(s): Secondary | ICD-10-CM

## 2020-04-04 DIAGNOSIS — M79675 Pain in left toe(s): Secondary | ICD-10-CM

## 2020-04-04 DIAGNOSIS — E1140 Type 2 diabetes mellitus with diabetic neuropathy, unspecified: Secondary | ICD-10-CM

## 2020-04-06 NOTE — Progress Notes (Signed)
Subjective:   Patient ID: Edwin Gallagher, male   DOB: 64 y.o.   MRN: [id number]   HPI Patient presents stating long-term diabetic review various rate of neurological sensation who has nail disease 1-5 both feet that he cannot cut and they become painful as they elongated and digging into the corners.  Patient does not smoke likes to be active   ROS      Objective:  Physical Exam  Neurological scar is reduced with diminished sharp dull vibratory with circulatory status also mildly reduced both PT DP pulses.  Patient is noted to have thick yellow brittle toenails 1-5 both feet that are incurvated in the corners moderately tender and difficult for the patient to cut or to wear shoe gear with comfort.  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

## 2020-05-03 ENCOUNTER — Ambulatory Visit: Payer: Managed Care, Other (non HMO) | Admitting: Gastroenterology

## 2020-05-11 ENCOUNTER — Ambulatory Visit: Payer: Managed Care, Other (non HMO) | Admitting: Internal Medicine

## 2020-06-13 ENCOUNTER — Ambulatory Visit (INDEPENDENT_AMBULATORY_CARE_PROVIDER_SITE_OTHER): Payer: 59 | Admitting: Internal Medicine

## 2020-06-13 ENCOUNTER — Telehealth: Payer: Self-pay | Admitting: Internal Medicine

## 2020-06-13 ENCOUNTER — Other Ambulatory Visit: Payer: Self-pay

## 2020-06-13 ENCOUNTER — Encounter: Payer: Self-pay | Admitting: Internal Medicine

## 2020-06-13 VITALS — BP 120/60 | HR 80 | Temp 97.6°F | Ht 72.0 in | Wt 296.4 lb

## 2020-06-13 DIAGNOSIS — G4752 REM sleep behavior disorder: Secondary | ICD-10-CM | POA: Diagnosis not present

## 2020-06-13 DIAGNOSIS — G4733 Obstructive sleep apnea (adult) (pediatric): Secondary | ICD-10-CM | POA: Diagnosis not present

## 2020-06-13 DIAGNOSIS — E66812 Obesity, class 2: Secondary | ICD-10-CM

## 2020-06-13 NOTE — Progress Notes (Signed)
HPI male never smoker followed for OSA, complicated by obesity, DM 2, HBP, PAFib NPSG 2012 AHI 85/ hr, CPAP titrated to 14 HST-09/24/17-AHI 20.9/hour, desaturation to 84%, body weight 289 pounds  --------------------------------------   06/24/2019-  64 year old male never smoker followed for OSA, complicated by obesity, DM 2, HBP, PAFib CPAP auto 5-12/ [facility] Download compliance 100%, AHI 1.5/ hr Body weight today 272 lbs Mask desensitization> nasal pillows Respironics Nuance Pro Gel w large pillows. -----OSA on CPAP Auto 5-12, DME: [facility]; no complaints, using it every night Frequent nocturia due to meds. Gets back to sleep quickly. Occ needs tramadol for back pain.   06/13/20-  64 year old male never smoker followed for OSA, complicated by obesity, DM 2, HBP, PAFib/ Eliquis,  CPAP auto 5-12/ [facility] Download compliance 57%, AHI 1.4/ hr Body weight today - 296 lbs Had 2 Phizer Covax Wife puts pillows between them- he moves, strikes her, talks in his sleep. He is unaware.  He leaves CPAP off after getting up for bathroom- discussed.   ROS-see HPI   + = positive Constitutional:    weight loss, night sweats, fevers, chills, fatigue, lassitude. HEENT:    headaches, difficulty swallowing, tooth/dental problems, sore throat,       sneezing, itching, ear ache, nasal congestion, post nasal drip, snoring CV:    chest pain, orthopnea, PND, swelling in lower extremities, anasarca,                                                          dizziness, palpitations Resp:   shortness of breath with exertion or at rest.                productive cough,   non-productive cough, coughing up of blood.              change in color of mucus.  wheezing.    Skin:    rash or lesions. GI:  No-   heartburn, indigestion, abdominal pain, nausea, vomiting, diarrhea,                 change in bowel habits, loss of appetite GU: dysuria, change in color of urine, + nocturia.   flank pain. MS:   joint pain, stiffness,  decreased range of motion,+ back pain. Neuro-     nothing unusual Psych:  change in mood or affect.  depression or anxiety.   memory loss.  OBJ- Physical Exam General- Alert, Oriented, Affect-appropriate, Distress- none acute, + Obese Skin- rash-none, lesions- none, excoriation- none Lymphadenopathy- none Head- atraumatic            Eyes- Gross vision intact, PERRLA, conjunctivae and secretions clear            Ears- Hearing, canals-normal            Nose- Clear, no-Septal dev, mucus, polyps, erosion, perforation             Throat- Mallampati III , mucosa clear , drainage- none, tonsils- atrophic Neck- flexible , trachea midline, no stridor , thyroid nl, carotid no bruit Chest - symmetrical excursion , unlabored           Heart/CV- RRR at this visit , no murmur , no gallop  , no rub, nl s1 s2                           -  JVD- none , edema- none, stasis changes- none, varices- none           Lung- clear to P&A, wheeze- none, cough- none , dullness-none, rub- none           Chest wall-  Abd-  Br/ Gen/ Rectal- Not done, not indicated Extrem- cyanosis- none, clubbing, none, atrophy- none, strength- nl Neuro- grossly intact to observation     

## 2020-06-13 NOTE — Telephone Encounter (Signed)
Called and left message for Neuro to return our call.

## 2020-06-13 NOTE — Telephone Encounter (Signed)
Patient calling because his weight was entered incorrectly in Epic, found on AVS. Correct weight placed in chart.

## 2020-06-13 NOTE — Patient Instructions (Addendum)
Order- schedule CPAP titration sleep study    Dx OSA, REM Behavior Disorder.  Consider looking at your drug store for otc mouth rinse Biotene for dry mouth. If you swish this in your mouth in the evening, it might help you feel a little less thirsty, so maybe you won't need to get up so much at night for the bathroom.   Please call if we can help

## 2020-06-14 NOTE — Telephone Encounter (Signed)
Previous order for CPAP titration does have [facility] listed as the facility to do the study at. I have fixed this for [facility] to be the facility. Nothing further was needed.

## 2020-06-23 ENCOUNTER — Ambulatory Visit: Payer: 59 | Admitting: Internal Medicine

## 2020-06-26 NOTE — Assessment & Plan Note (Signed)
Continue attention to diet and exercise.

## 2020-06-26 NOTE — Assessment & Plan Note (Addendum)
Benefits from CPAP when used. Needs to improve compliance.  Possible REM Behavior Disorder discussed. Not clear that these events happen when wearing CPAP. Most likely partial arousals due to sleep apnea when he leaves CPAP off. Watch for need to treat this separately.  Plan- education done. Try Biotene fro dry mouth.

## 2020-07-06 ENCOUNTER — Ambulatory Visit (INDEPENDENT_AMBULATORY_CARE_PROVIDER_SITE_OTHER): Payer: 59 | Admitting: Podiatry

## 2020-07-06 ENCOUNTER — Ambulatory Visit: Payer: 59 | Admitting: Podiatry

## 2020-07-06 ENCOUNTER — Other Ambulatory Visit: Payer: Self-pay

## 2020-07-06 ENCOUNTER — Encounter: Payer: Self-pay | Admitting: Podiatry

## 2020-07-06 DIAGNOSIS — D689 Coagulation defect, unspecified: Secondary | ICD-10-CM

## 2020-07-06 DIAGNOSIS — E1140 Type 2 diabetes mellitus with diabetic neuropathy, unspecified: Secondary | ICD-10-CM

## 2020-07-06 DIAGNOSIS — B351 Tinea unguium: Secondary | ICD-10-CM | POA: Diagnosis not present

## 2020-07-06 DIAGNOSIS — M79674 Pain in right toe(s): Secondary | ICD-10-CM

## 2020-07-06 DIAGNOSIS — M79675 Pain in left toe(s): Secondary | ICD-10-CM

## 2020-07-06 DIAGNOSIS — E1149 Type 2 diabetes mellitus with other diabetic neurological complication: Secondary | ICD-10-CM

## 2020-07-06 NOTE — Progress Notes (Signed)
This patient returns to my office for at risk foot care.  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  This patient is unable to cut nails himself since the patient cannot reach his nails.These nails are painful walking and wearing shoes.  This patient presents for at risk foot care today.  General Appearance  Alert, conversant and in no acute stress.  Vascular  Dorsalis pedis and posterior tibial  pulses are palpable  bilaterally.  Capillary return is within normal limits  bilaterally. Temperature is within normal limits  bilaterally.  Neurologic  Senn-Weinstein monofilament wire test within normal limits  bilaterally. Muscle power within normal limits bilaterally.  Nails Thick disfigured discolored nails with subungual debris  from hallux to fifth toes bilaterally. No evidence of bacterial infection or drainage bilaterally.  Orthopedic  No limitations of motion  feet .  No crepitus or effusions noted.  No bony pathology or digital deformities noted. Mild HAV  B/L.  ADV 5th digit  B/L.  Skin  normotropic skin with no porokeratosis noted bilaterally.  No signs of infections or ulcers noted.     Onychomycosis  Pain in right toes  Pain in left toes  Consent was obtained for treatment procedures.   Mechanical debridement of nails 1-5  bilaterally performed with a nail nipper.  Filed with dremel without incident.    Return office visit     4 months                 Told patient to return for periodic foot care and evaluation due to potential at risk complications.   Helane Gunther DPM

## 2020-07-14 ENCOUNTER — Other Ambulatory Visit: Payer: Self-pay

## 2020-07-14 ENCOUNTER — Ambulatory Visit (HOSPITAL_BASED_OUTPATIENT_CLINIC_OR_DEPARTMENT_OTHER): Payer: 59 | Attending: Internal Medicine | Admitting: Internal Medicine

## 2020-07-14 VITALS — Ht 72.0 in | Wt 267.0 lb

## 2020-07-14 DIAGNOSIS — G4733 Obstructive sleep apnea (adult) (pediatric): Secondary | ICD-10-CM | POA: Insufficient documentation

## 2020-07-14 DIAGNOSIS — Z7984 Long term (current) use of oral hypoglycemic drugs: Secondary | ICD-10-CM | POA: Insufficient documentation

## 2020-07-14 DIAGNOSIS — Z79899 Other long term (current) drug therapy: Secondary | ICD-10-CM | POA: Insufficient documentation

## 2020-07-14 DIAGNOSIS — Z7901 Long term (current) use of anticoagulants: Secondary | ICD-10-CM | POA: Insufficient documentation

## 2020-07-14 DIAGNOSIS — G4752 REM sleep behavior disorder: Secondary | ICD-10-CM

## 2020-07-30 DIAGNOSIS — G4733 Obstructive sleep apnea (adult) (pediatric): Secondary | ICD-10-CM

## 2020-07-30 NOTE — Procedures (Signed)
Patient Name: Edwin Gallagher, Edwin Gallagher Date: 07/14/2020 Gender: Male D.O.B: [birth year] Age (years): 33 Referring Provider: Jetty Duhamel MD, ABSM Height (inches): 70 Interpreting Physician: Jetty Duhamel MD, ABSM Weight (lbs): 267 RPSGT: Cherylann Parr BMI: 38 MRN: [id number] Neck Size: 18.00  CLINICAL INFORMATION The patient is referred for a CPAP titration to treat sleep apnea. Date of NPSG, Split Night or HST:   HST 09/24/17  AHI 20.9/ hr, desaturation to 89%, body weight 289 lbs  SLEEP STUDY TECHNIQUE As per the AASM Manual for the Scoring of Sleep and Associated Events v2.3 (April 2016) with a hypopnea requiring 4% desaturations.  The channels recorded and monitored were frontal, central and occipital EEG, electrooculogram (EOG), submentalis EMG (chin), nasal and oral airflow, thoracic and abdominal wall motion, anterior tibialis EMG, snore microphone, electrocardiogram, and pulse oximetry. Continuous positive airway pressure (CPAP) was initiated at the beginning of the study and titrated to treat sleep-disordered breathing.  MEDICATIONS Medications self-administered by patient taken the night of the study : METFORMIN, DOXAZOSIN MESYLATE, ELIQUIS, GLIMIPIRIDE  TECHNICIAN COMMENTS Comments added by technician: RBD observed during the night. Patient talked in his/her sleep. SIGNIFICANT RBD EPISODE OBSERVED , EPOCH PAGES COPIES ATTACHED, SEE SUMMARY ALSO Comments added by scorer: N/A RESPIRATORY PARAMETERS Optimal PAP Pressure (cm): 14 AHI at Optimal Pressure (/hr): N/A Overall Minimal O2 (%): 89.0 Supine % at Optimal Pressure (%): N/A Minimal O2 at Optimal Pressure (%): 89.0   SLEEP ARCHITECTURE The study was initiated at 11:07:07 PM and ended at 5:10:40 AM.  Sleep onset time was 5.8 minutes and the sleep efficiency was 93.3%%. The total sleep time was 339.2 minutes.  The patient spent 2.2%% of the night in stage N1 sleep, 82.6%% in stage N2 sleep, 0.0%% in stage N3 and 15.2% in  REM.Stage REM latency was 70.0 minutes  Wake after sleep onset was 18.5. Alpha intrusion was absent. Supine sleep was 54.09%.  CARDIAC DATA The 2 lead EKG demonstrated sinus rhythm. The mean heart rate was 67.1 beats per minute. Other EKG findings include: None.  LEG MOVEMENT DATA The total Periodic Limb Movements of Sleep (PLMS) were 0. The PLMS index was 0.0. A PLMS index of <15 is considered normal in adults.  IMPRESSIONS - The optimal PAP pressure was 14 cm of water. - Central sleep apnea was not noted during this titration (CAI = 0.0/h). - Mild oxygen desaturations were observed during this titration (min O2 = 89.0%). Minimum O2 sat on CPAP 13 was 90%. - No snoring was audible during this study. - No cardiac abnormalities were observed during this study. - Clinically significant periodic limb movements were not noted during this study. Arousals associated with PLMs were rare. - REM Behavior Disorder- several episodes of screaming with arms raised in air, associated wioth REM.  DIAGNOSIS - Obstructive Sleep Apnea (G47.33) - REM Behavior Disorder  RECOMMENDATIONS - Trial of CPAP therapy on 14 cm H2O or autopap 12-18. - Patient used a Medium size [employer] Respironics Nasal Pillow Merchandiser, retail Under Nose Frame (M) mask and heated humidification. - Manage RBD based on clinical judgment. - Sleep hygiene should be reviewed to assess factors that may improve sleep quality. - Weight management and regular exercise should be initiated or continued.  [Electronically signed] 07/30/2020 04:27 PM  Jetty Duhamel MD, ABSM Diplomate, American Board of Sleep Medicine   NPI: [id number]                         Jetty Duhamel  Diplomate, Biomedical engineer of Sleep Medicine  ELECTRONICALLY SIGNED ON:  07/30/2020, 4:22 PM [facility] PH: [phone]   FX: [phone] ACCREDITED BY THE AMERICAN ACADEMY OF SLEEP MEDICINE

## 2020-08-24 ENCOUNTER — Ambulatory Visit (INDEPENDENT_AMBULATORY_CARE_PROVIDER_SITE_OTHER): Payer: 59 | Admitting: Internal Medicine

## 2020-08-24 ENCOUNTER — Other Ambulatory Visit: Payer: Self-pay

## 2020-08-24 ENCOUNTER — Encounter: Payer: Self-pay | Admitting: Internal Medicine

## 2020-08-24 VITALS — BP 142/62 | HR 80 | Temp 98.0°F | Ht 72.0 in | Wt 269.6 lb

## 2020-08-24 DIAGNOSIS — G4752 REM sleep behavior disorder: Secondary | ICD-10-CM | POA: Diagnosis not present

## 2020-08-24 DIAGNOSIS — G4733 Obstructive sleep apnea (adult) (pediatric): Secondary | ICD-10-CM

## 2020-08-24 NOTE — Assessment & Plan Note (Signed)
RBD may respond to high dose melatonin or clonazepam. A significant number of people with this will progress over time to movement disorders and dementia. Dr Thornell Mule has initiated referral to neurology at [facility].  Plan- start melatonin 6-12 mg every night initially, pending neuro visit.

## 2020-08-24 NOTE — Assessment & Plan Note (Addendum)
He benefits from CPAP. Download indicates good compliance and control with auto 5-12. Since titration study suggested best control at 14, we are going to nudge the auto range up a little to 12-18 and see how he does.. Goal is to prevent any REM apneas that might interrupt REM leading to confusional arousal.

## 2020-08-24 NOTE — Progress Notes (Signed)
HPI male never smoker followed for OSA, REM Behavior Disorder, complicated by obesity, DM 2, HBP, PAFib, Hyperlipidemia, NPSG 2012 AHI 85/ hr, CPAP titrated to 14 HST-09/24/17-AHI 20.9/hour, desaturation to 84%, body weight 289 pounds  --------------------------------------   06/13/20-  64 year old male never smoker followed for OSA, complicated by obesity, DM 2, HBP, PAFib/ Eliquis,  CPAP auto 5-12/ [facility] Download compliance 57%, AHI 1.4/ hr Body weight today - 296 lbs Had 2 Phizer Covax Wife puts pillows between them- he moves, strikes her, talks in his sleep. He is unaware.  He leaves CPAP off after getting up for bathroom- discussed.   08/24/20- 64 year old male never smoker followed for OSA, REM Behavior Disorder, complicated by obesity, DM 2, HTN, PAFib/ Eliquis, Hyperlipidemia,  CPAP titration study 07/14/20- 14 cwp, + RBS with screaming, raising arms during REM CPAP auto 5-12/ [facility] Download- compliance 83%, AHI 1.6/ hr Body weight- 269 lbs Covid vax- 2 Phizer Flu vax- had PCP Dr Thornell Mule has referred to [facility] for RBD and memory concerns. Control on current settings is good, but titration study suggested he might do better with a litle higher pressure, so we will try. Goal is to address any sleep fragmentation due to OSA.  I explained RBD as well as I could for him. He denies any family hx of Parkinsons or dementia. He was unsure if he had any personal memory issue now.   ROS-see HPI   + = positive Constitutional:    weight loss, night sweats, fevers, chills, fatigue, lassitude. HEENT:    headaches, difficulty swallowing, tooth/dental problems, sore throat,       sneezing, itching, ear ache, nasal congestion, post nasal drip, snoring CV:    chest pain, orthopnea, PND, swelling in lower extremities, anasarca,                                                          dizziness, palpitations Resp:   shortness of breath with exertion or at rest.                productive cough,    non-productive cough, coughing up of blood.              change in color of mucus.  wheezing.    Skin:    rash or lesions. GI:  No-   heartburn, indigestion, abdominal pain, nausea, vomiting, diarrhea,                 change in bowel habits, loss of appetite GU: dysuria, change in color of urine, + nocturia.   flank pain. MS:   joint pain, stiffness, decreased range of motion,+ back pain. Neuro-     + HPI Psych:  change in mood or affect.  depression or anxiety.   memory loss.  OBJ- Physical Exam General- Alert, Oriented, Affect-appropriate, Distress- none acute, + Obese Skin- rash-none, lesions- none, excoriation- none Lymphadenopathy- none Head- atraumatic            Eyes- Gross vision intact, PERRLA, conjunctivae and secretions clear            Ears- Hearing, canals-normal            Nose- Clear, no-Septal dev, mucus, polyps, erosion, perforation             Throat- Mallampati III ,  mucosa clear , drainage- none, tonsils- atrophic Neck- flexible , trachea midline, no stridor , thyroid nl, carotid no bruit Chest - symmetrical excursion , unlabored           Heart/CV- RRR at this visit , no murmur , no gallop  , no rub, nl s1 s2                           - JVD- none , edema- none, stasis changes- none, varices- none           Lung- clear to P&A, wheeze- none, cough- none , dullness-none, rub- none           Chest wall-  Abd-  Br/ Gen/ Rectal- Not done, not indicated Extrem- cyanosis- none, clubbing, none, atrophy- none, strength- nl Neuro- grossly intact to observation

## 2020-08-24 NOTE — Patient Instructions (Signed)
Order- DME Christoper Allegra- please change autopap range to 12-18, continue mask of choice, humidifier, supplies, AirView/ card  Edwin Gallagher- add Melatonin (otc) 5 or 6 mg every night at bedtime. If this isn't enough to calm down the movement and yelling in your sleep that we call "REM Behavior Disorder" then increase to 12 mg every night at bedtime  Keep the appointment that Dr Thornell Mule is making with the Neurologists. They will look at how the melatonin has been working by the time that you see them, and see if there is other work that needs to be done.

## 2020-08-25 ENCOUNTER — Telehealth: Payer: Self-pay | Admitting: Internal Medicine

## 2020-08-25 DIAGNOSIS — G4733 Obstructive sleep apnea (adult) (pediatric): Secondary | ICD-10-CM

## 2020-08-25 NOTE — Telephone Encounter (Signed)
Please change CPAP pressure to auto 5-14

## 2020-08-25 NOTE — Telephone Encounter (Signed)
Rx has been placed to [facility] to have pt's pressure change. Called and spoke with pt letting him know that this was done and he verbalized understanding.  Pt stated that he would like CY to call him to discuss with him the diagnosis he was told yesterday 10/13 by CY.  Dr. Maple Hudson please advise as pt would like for you to call him.

## 2020-08-25 NOTE — Telephone Encounter (Signed)
Called and spoke with pt. Pt is requesting to switch back to his previous CPAP settings as he was not able to sleep at all overnight with his new settings.  Dr. Maple Hudson, please advise.

## 2020-08-26 NOTE — Telephone Encounter (Signed)
I went back over my explanation of REM Behavior Disorder again and answered his questions, emphasizing trial of melatonin pending his visit to Neurology.

## 2020-09-15 ENCOUNTER — Ambulatory Visit: Payer: 59 | Admitting: Internal Medicine

## 2020-11-03 ENCOUNTER — Encounter: Payer: Self-pay | Admitting: Neurology

## 2020-11-03 ENCOUNTER — Ambulatory Visit (INDEPENDENT_AMBULATORY_CARE_PROVIDER_SITE_OTHER): Payer: 59 | Admitting: Neurology

## 2020-11-03 VITALS — BP 128/75 | HR 75 | Ht 72.0 in | Wt 267.0 lb

## 2020-11-03 DIAGNOSIS — R531 Weakness: Secondary | ICD-10-CM | POA: Diagnosis not present

## 2020-11-03 DIAGNOSIS — R269 Unspecified abnormalities of gait and mobility: Secondary | ICD-10-CM | POA: Diagnosis not present

## 2020-11-03 DIAGNOSIS — G8929 Other chronic pain: Secondary | ICD-10-CM | POA: Diagnosis not present

## 2020-11-03 DIAGNOSIS — M25512 Pain in left shoulder: Secondary | ICD-10-CM

## 2020-11-03 NOTE — Progress Notes (Signed)
Chief Complaint  Patient presents with  . New Patient (Initial Visit)    MMSE 30/30 - 13 animals. Reports memory problems/forgetfulness, balance concerns (left foot "slides"),involuntary left arm tremors and left arm weakness. Also, he has sleep issues (being told he swinging his arms while sleeping and talking nonsense out loud).    HISTORICAL  Edwin Gallagher is a 64 year old male, seen in request by his primary care physician Dr. Charlane Ferretti for evaluation of balance concerns, sleep issues, forgetfulness, initial evaluation was on November 03, 2020  I reviewed and summarized the referring note. PMHX. HTN HLD DM Atrial fibrillation, on Eliquis,  He retired from [employer] in 2017, since then, he become less active, still drive his children to school, doing some yard work, over the past 4 years, he noticed decreased stamina, decreased energy, mild balance issues,  He used to work heavy labor at [employer], he denies family history of memory loss,  He was noted by his family that he moves a lot during sleep, talking out of dreams,  He also complains of left shoulder pain, he has to focus on lifting his left leg while ambulating, more noticeable walking longer distances, he also have left hand resting tremor  He denied loss of sense of smell, no constipations,  Laboratory evaluation in October 2021, normal CMP, potassium of 3.2, CBC, hemoglobin of 13.3, B12 294, LDL 108 TSH 1.28,  REVIEW OF SYSTEMS: Full 14 system review of systems performed and notable only for as above All other review of systems were negative.  ALLERGIES: No Known Allergies  HOME MEDICATIONS: Current Outpatient Medications  Medication Sig Dispense Refill  . amLODipine (NORVASC) 10 MG tablet Take 10 mg by mouth daily.    Marland Kitchen apixaban (ELIQUIS) 5 MG TABS tablet Take 1 tablet (5 mg total) by mouth 2 (two) times daily. 60 tablet 3  . diltiazem (CARDIZEM) 30 MG tablet TAKE 1 TABLET EVERY 4 HOURS AS NEEDED FOR AFIB HEART RATE  >100 45 tablet 1  . doxazosin (CARDURA) 4 MG tablet Take 4 mg by mouth 2 (two) times daily.    Marland Kitchen glimepiride (AMARYL) 4 MG tablet Take 4 mg by mouth 2 (two) times daily.    Marland Kitchen losartan-hydrochlorothiazide (HYZAAR) 100-25 MG per tablet Take 1 tablet by mouth daily.    . metFORMIN (GLUCOPHAGE) 500 MG tablet Take 500 mg by mouth 2 (two) times daily with a meal.    . metoprolol succinate (TOPROL-XL) 100 MG 24 hr tablet Take 1 tablet (100 mg total) by mouth daily. 30 tablet 0  . ONETOUCH ULTRA test strip USE TO TEST BLOOD SUGAR TWICE DAILY    . simvastatin (ZOCOR) 20 MG tablet Take 20 mg by mouth daily.     . traMADol (ULTRAM) 50 MG tablet Take 50 mg by mouth as needed.     No current facility-administered medications for this visit.    PAST MEDICAL HISTORY: Past Medical History:  Diagnosis Date  . Abnormal cardiac CT angiography 2006   only abnormal with soft plaque in LAD calcium score is0   . Abnormal LFTs   . Balance problem   . CAD (coronary artery disease)    a. has seen Dr. Riley Kill in the past and had cardiac CT 2006 with calcium score of 0. He had soft plaque in LAD of about 50%.  He had a normal stress echo in 2012. He also had a normal nuc in 2016.  Marland Kitchen Chest discomfort   . Diabetes mellitus   .  Forgetfulness   . Hyperlipidemia   . Hypertension   . Obesity   . OSA (obstructive sleep apnea)   . Paroxysmal atrial fibrillation (HCC)   . Tremor     PAST SURGICAL HISTORY: Past Surgical History:  Procedure Laterality Date  . ROTATOR CUFF REPAIR  2009   Right  . TRIGGER FINGER RELEASE  02/12/2012   Procedure: MINOR RELEASE TRIGGER FINGER/A-1 PULLEY;  Surgeon: Wyn Forster., MD;  Location: [facility];  Service: Orthopedics;  Laterality: Right;  right thumb    FAMILY HISTORY: Family History  Problem Relation Age of Onset  . Hypertension Mother   . Hypertension Father   . Stroke Father   . Sudden death Sister   . Hypertension Sister   . Alcohol abuse  Brother   . Heart attack Neg Hx     SOCIAL HISTORY: Social History   Socioeconomic History  . Marital status: Married    Spouse name: Not on file  . Number of children: 9  . Years of education: two years of college  . Highest education level: Not on file  Occupational History  . Occupation: Retired     Associate Professor: [employer]  Tobacco Use  . Smoking status: Never Smoker  . Smokeless tobacco: Never Used  Substance and Sexual Activity  . Alcohol use: No  . Drug use: No  . Sexual activity: Not on file  Other Topics Concern  . Not on file  Social History Narrative   Lives at home with his wife.   Ambidextrous.   No daily use of caffeine, occasional soda.   Social Determinants of Health   Financial Resource Strain: Not on file  Food Insecurity: Not on file  Transportation Needs: Not on file  Physical Activity: Not on file  Stress: Not on file  Social Connections: Not on file  Intimate Partner Violence: Not on file     PHYSICAL EXAM   Vitals:   11/03/20 1001  BP: 128/75  Pulse: 75  Weight: 267 lb (121.1 kg)  Height: 6' (1.829 m)   Not recorded     Body mass index is 36.21 kg/m.  PHYSICAL EXAMNIATION:  Gen: NAD, conversant, well nourised, well groomed                     Cardiovascular: Regular rate rhythm, no peripheral edema, warm, nontender. Eyes: Conjunctivae clear without exudates or hemorrhage Neck: Supple, no carotid bruits. Pulmonary: Clear to auscultation bilaterally   NEUROLOGICAL EXAM:  MENTAL STATUS: MMSE - Mini Mental State Exam 11/03/2020  Orientation to time 5  Orientation to Place 5  Registration 3  Attention/ Calculation 5  Recall 3  Language- name 2 objects 2  Language- repeat 1  Language- follow 3 step command 3  Language- read & follow direction 1  Write a sentence 1  Copy design 1  Total score 30  animal 13  CRANIAL NERVES: CN II: Visual fields are full to confrontation. Pupils are round equal and briskly reactive to light. CN  III, IV, VI: extraocular movement are normal. No ptosis. CN V: Facial sensation is intact to light touch CN VII: Face is symmetric with normal eye closure  CN VIII: Hearing is normal to causal conversation. CN IX, X: Phonation is normal. CN XI: Head turning and shoulder shrug are intact  MOTOR: Limited range of left shoulder, due to left shoulder pain, there was no significant bilateral upper or lower extremity weakness  REFLEXES: Reflexes are 2+  and symmetric at the biceps, triceps, knees, and ankles. Plantar responses are flexor.  SENSORY: Intact to light touch, pinprick and vibratory sensation are intact in fingers and toes.  COORDINATION: There is no trunk or limb dysmetria noted.  GAIT/STANCE: Need to push up to get up from seated position, decreased swing of left arm, moderate stride Romberg is absent.   DIAGNOSTIC DATA (LABS, IMAGING, TESTING) - I reviewed patient records, labs, notes, testing and imaging myself where available.   ASSESSMENT AND PLAN  Edwin Gallagher is a 64 y.o. male   Gait abnormality, Chronic left shoulder pain,  Referral to orthopedic surgeon for left shoulder pain  Physical therapy  He has mild parkinsonian features on examination, also complains of REM sleep disorder, perform MRI of the brain to rule out structural abnormality  Levert Feinstein, M.D. Ph.D.  [facility] [address], [address] [place], [place] [address] Ph: [phone] Fax: [phone]  CC:  Charlane Ferretti, DO [address] [place],  [place] [address]

## 2020-11-09 ENCOUNTER — Ambulatory Visit: Payer: 59 | Admitting: Podiatry

## 2020-11-10 ENCOUNTER — Ambulatory Visit: Payer: 59 | Attending: Neurology | Admitting: Physical Therapy

## 2020-11-10 ENCOUNTER — Encounter: Payer: Self-pay | Admitting: Physical Therapy

## 2020-11-10 ENCOUNTER — Other Ambulatory Visit: Payer: Self-pay

## 2020-11-10 DIAGNOSIS — M6281 Muscle weakness (generalized): Secondary | ICD-10-CM | POA: Diagnosis not present

## 2020-11-10 DIAGNOSIS — R262 Difficulty in walking, not elsewhere classified: Secondary | ICD-10-CM | POA: Diagnosis present

## 2020-11-10 NOTE — Therapy (Signed)
[facility] [address] [place], [place], [address] Phone: [phone]   Fax:  [phone]  Physical Therapy Evaluation  Patient Details  Name: Edwin Gallagher MRN: [id number] Date of Birth: 06-26-1956 Referring Provider (PT): Basil Dess Date: 11/10/2020   PT End of Session - 11/10/20 1505    Visit Number 1    Date for PT Re-Evaluation 01/11/21    PT Start Time 1440    PT Stop Time 1526    PT Time Calculation (min) 46 min    Activity Tolerance Patient tolerated treatment well    Behavior During Therapy [facility] for tasks assessed/performed           Past Medical History:  Diagnosis Date  . Abnormal cardiac CT angiography 2006   only abnormal with soft plaque in LAD calcium score is0   . Abnormal LFTs   . Balance problem   . CAD (coronary artery disease)    a. has seen Dr. Riley Kill in the past and had cardiac CT 2006 with calcium score of 0. He had soft plaque in LAD of about 50%.  He had a normal stress echo in 2012. He also had a normal nuc in 2016.  Marland Kitchen Chest discomfort   . Diabetes mellitus   . Forgetfulness   . Hyperlipidemia   . Hypertension   . Obesity   . OSA (obstructive sleep apnea)   . Paroxysmal atrial fibrillation (HCC)   . Tremor     Past Surgical History:  Procedure Laterality Date  . ROTATOR CUFF REPAIR  2009   Right  . TRIGGER FINGER RELEASE  02/12/2012   Procedure: MINOR RELEASE TRIGGER FINGER/A-1 PULLEY;  Surgeon: Wyn Forster., MD;  Location: [facility];  Service: Orthopedics;  Laterality: Right;  right thumb    There were no vitals filed for this visit.    Subjective Assessment - 11/10/20 1436    Subjective Patient reports that over the past 6 months he has had increase in difficulty walking and some c/o weakness of the left leg and arm.  He has seen neurologist, he reports that they do not think he has had a stroke but they are working to get him in for an MRI.  Denies falls  but does report stumbles and being unsteady with walking    Limitations Standing;Walking;House hold activities    Patient Stated Goals walk better, be stronger    Currently in Pain? No/denies              [facility] PT Assessment - 11/10/20 0001      Assessment   Medical Diagnosis left side weakness, difficulty walking    Referring Provider (PT) Terrace [place]    Onset Date/Surgical Date 10/27/20    Hand Dominance Right    Prior Therapy no      Precautions   Precautions None      Balance Screen   Has the patient fallen in the past 6 months No    Has the patient had a decrease in activity level because of a fear of falling?  Yes    Is the patient reluctant to leave their home because of a fear of falling?  No      Home Environment   Additional Comments has stairs, does some cooking      Prior Function   Level of Independence Independent    Vocation Retired    Leisure no exercise      Observation/Other  Assessments   Observations has a left hand tremor, he has been tested by the MD about this per him      ROM / Strength   AROM / PROM / Strength AROM;Strength      AROM   Overall AROM Comments has limited Left shoulder flexion 120 and abduction 100 degrees      Strength   Overall Strength Comments left UE strength is 4/5 except for flexion and abduction 3+/5    Strength Assessment Site Hip;Knee;Ankle    Right/Left Hip Left    Left Hip Flexion 4-/5    Left Hip ABduction 4-/5    Right/Left Knee Left    Left Knee Flexion 4-/5    Left Knee Extension 4-/5    Right/Left Ankle Left    Left Ankle Dorsiflexion 4-/5    Left Ankle Plantar Flexion 4-/5    Left Ankle Inversion 4-/5    Left Ankle Eversion 4-/5      Ambulation/Gait   Gait Comments unsteady gait, limps on the left, drags feet at times, his left leg seems to go into flexion at med stance and he limps, mild trendelenberg      Standardized Balance Assessment   Standardized Balance Assessment Timed Up and Go Test;Berg Balance  Test      Berg Balance Test   Sit to Stand Able to stand without using hands and stabilize independently    Standing Unsupported Able to stand safely 2 minutes    Sitting with Back Unsupported but Feet Supported on Floor or Stool Able to sit safely and securely 2 minutes    Stand to Sit Sits safely with minimal use of hands    Transfers Able to transfer safely, minor use of hands    Standing Unsupported with Eyes Closed Able to stand 10 seconds safely    Standing Unsupported with Feet Together Able to place feet together independently and stand 1 minute safely    From Standing, Reach Forward with Outstretched Arm Can reach confidently >25 cm (10")    From Standing Position, Pick up Object from Floor Able to pick up shoe safely and easily    From Standing Position, Turn to Look Behind Over each Shoulder Looks behind from both sides and weight shifts well    Turn 360 Degrees Able to turn 360 degrees safely one side only in 4 seconds or less    Standing Unsupported, Alternately Place Feet on Step/Stool Able to stand independently and complete 8 steps >20 seconds    Standing Unsupported, One Foot in Front Able to take small step independently and hold 30 seconds    Standing on One Leg Able to lift leg independently and hold equal to or more than 3 seconds    Total Score 50      Timed Up and Go Test   Normal TUG (seconds) 13    TUG Comments no device                      Objective measurements completed on examination: See above findings.       [facility] Adult PT Treatment/Exercise - 11/10/20 0001      Exercises   Exercises Knee/Hip      Knee/Hip Exercises: Aerobic   Nustep level 5 x 5 minutes                    PT Short Term Goals - 11/10/20 1518      PT SHORT TERM GOAL #  1   Title issues HEP    Time 2    Period Weeks    Status New             PT Long Term Goals - 11/10/20 1519      PT LONG TERM GOAL #1   Title understand fall risks in the home     Time 8    Period Weeks    Status New      PT LONG TERM GOAL #2   Title increase Berg balance score to 53/56    Period Weeks    Status New      PT LONG TERM GOAL #3   Title increase left LE strength to 5/5    Time 8    Period Weeks    Status New      PT LONG TERM GOAL #4   Title walk without difficulty and without toe drag    Time 8    Period Weeks    Status New                  Plan - 11/10/20 1506    Clinical Impression Statement Patient reports that he started having difficulty walking in June/July with a visit to [facility].  He reports left sided weakness.  Has seen the neurologist and they do not think he has had a stroke.  They are working to get an MRI scheduled.  He seems to have left hip and knee weakness.  When walking at mid stance the knee seems to go into flexion.  He caught his toe a few times due to shuffling.  The TUG and the BERG were both pretty good, but he did struggle with the higher level activities.    Stability/Clinical Decision Making Evolving/Moderate complexity    Clinical Decision Making Low    Rehab Potential Good    PT Frequency 2x / week    PT Duration 8 weeks    PT Treatment/Interventions ADLs/Self Care Home Management;Gait training;Stair training;Functional mobility training;Therapeutic activities;Therapeutic exercise;Balance training;Neuromuscular re-education;Manual techniques;Patient/family education    PT Next Visit Plan start gym activities addressing LE and core strength    Consulted and Agree with Plan of Care Patient           Patient will benefit from skilled therapeutic intervention in order to improve the following deficits and impairments:  Abnormal gait,Decreased range of motion,Difficulty walking,Decreased activity tolerance,Cardiopulmonary status limiting activity,Decreased balance,Impaired flexibility,Decreased strength,Pain,Decreased mobility  Visit Diagnosis: Muscle weakness (generalized) - Plan: PT plan of care  cert/re-cert  Difficulty in walking, not elsewhere classified - Plan: PT plan of care cert/re-cert     Problem List Patient Active Problem List   Diagnosis Date Noted  . Gait abnormality 11/03/2020  . Left-sided weakness 11/03/2020  . Chronic left shoulder pain 11/03/2020  . REM behavioral disorder 08/24/2020  . Coagulation defect (HCC) 07/06/2020  . Paroxysmal atrial fibrillation (HCC) 01/06/2019  . OSA (obstructive sleep apnea) 09/24/2011  . Chest pain 05/11/2011  . Hypertension 05/11/2011  . Obesity 05/11/2011  . Hyperlipidemia 05/11/2011    Jearld Lesch., PT 11/10/2020, 3:23 PM  [facility] [address] [place], [place], [address] Phone: [phone]   Fax:  [phone]  Name: Edwin Gallagher MRN: [id number] Date of Birth: [birth year]

## 2020-11-15 ENCOUNTER — Ambulatory Visit: Payer: 59 | Attending: Neurology | Admitting: Physical Therapy

## 2020-11-15 ENCOUNTER — Encounter: Payer: Self-pay | Admitting: Physical Therapy

## 2020-11-15 ENCOUNTER — Ambulatory Visit: Payer: 59 | Admitting: Physical Therapy

## 2020-11-15 ENCOUNTER — Telehealth: Payer: Self-pay | Admitting: Neurology

## 2020-11-15 ENCOUNTER — Other Ambulatory Visit: Payer: Self-pay

## 2020-11-15 DIAGNOSIS — R262 Difficulty in walking, not elsewhere classified: Secondary | ICD-10-CM | POA: Diagnosis present

## 2020-11-15 DIAGNOSIS — M6281 Muscle weakness (generalized): Secondary | ICD-10-CM | POA: Insufficient documentation

## 2020-11-15 NOTE — Telephone Encounter (Signed)
Noted, I informed Taron with [facility].

## 2020-11-15 NOTE — Telephone Encounter (Signed)
Taron with [employer] is working on the authorization for this patient insurance [employer] and they need the notes to be signed.

## 2020-11-15 NOTE — Therapy (Signed)
[facility] [address] [place], [place], [address] Phone: [phone]   Fax:  [phone]  Physical Therapy Treatment  Patient Details  Name: Edwin Gallagher MRN: [id number] Date of Birth: 1956/05/03 Referring Provider (PT): Basil Dess Date: 11/15/2020   PT End of Session - 11/15/20 1720    Visit Number 2    Date for PT Re-Evaluation 01/11/21    PT Start Time 1614    PT Stop Time 1658    PT Time Calculation (min) 44 min    Activity Tolerance Patient tolerated treatment well    Behavior During Therapy [facility] for tasks assessed/performed           Past Medical History:  Diagnosis Date  . Abnormal cardiac CT angiography 2006   only abnormal with soft plaque in LAD calcium score is0   . Abnormal LFTs   . Balance problem   . CAD (coronary artery disease)    a. has seen Dr. Riley Kill in the past and had cardiac CT 2006 with calcium score of 0. He had soft plaque in LAD of about 50%.  He had a normal stress echo in 2012. He also had a normal nuc in 2016.  Marland Kitchen Chest discomfort   . Diabetes mellitus   . Forgetfulness   . Hyperlipidemia   . Hypertension   . Obesity   . OSA (obstructive sleep apnea)   . Paroxysmal atrial fibrillation (HCC)   . Tremor     Past Surgical History:  Procedure Laterality Date  . ROTATOR CUFF REPAIR  2009   Right  . TRIGGER FINGER RELEASE  02/12/2012   Procedure: MINOR RELEASE TRIGGER FINGER/A-1 PULLEY;  Surgeon: Wyn Forster., MD;  Location: [facility];  Service: Orthopedics;  Laterality: Right;  right thumb    There were no vitals filed for this visit.   Subjective Assessment - 11/15/20 1618    Subjective Pt reports that he is doing pretty well today; liked the nustep last rx.    Currently in Pain? No/denies                             [facility] Adult PT Treatment/Exercise - 11/15/20 0001      High Level Balance   High Level Balance Activities Side  stepping;Backward walking;Tandem walking    High Level Balance Comments on foam balance beam      Knee/Hip Exercises: Stretches   Gastroc Stretch Both;1 rep;20 seconds      Knee/Hip Exercises: Aerobic   Nustep L5 x 6 min      Knee/Hip Exercises: Machines for Strengthening   Cybex Knee Extension 5# 2x10 LLE, 10# 1x10 BLE    Cybex Knee Flexion 20# LLE 2x10, 25# BLE 1x10    Cybex Leg Press 40# 2x10 BLE      Knee/Hip Exercises: Standing   Heel Raises 1 set;Both;10 reps      Knee/Hip Exercises: Seated   Sit to Sand 10 reps;without UE support   with yellow ball chest press                 PT Education - 11/15/20 1721    Education Details Pt educated on HEP    Person(s) Educated Patient    Methods Explanation;Demonstration;Handout    Comprehension Verbalized understanding;Returned demonstration            PT Short Term Goals - 11/15/20 1736  PT SHORT TERM GOAL #1   Title Pt will be I with initial HEP    Time 2    Period Weeks    Status New             PT Long Term Goals - 11/10/20 1519      PT LONG TERM GOAL #1   Title understand fall risks in the home    Time 8    Period Weeks    Status New      PT LONG TERM GOAL #2   Title increase Berg balance score to 53/56    Period Weeks    Status New      PT LONG TERM GOAL #3   Title increase left LE strength to 5/5    Time 8    Period Weeks    Status New      PT LONG TERM GOAL #4   Title walk without difficulty and without toe drag    Time 8    Period Weeks    Status New                 Plan - 11/15/20 1722    Clinical Impression Statement Pt tolerated progression to TE well. Pt demos some instability on LLE with resisted sidestepping and with balance activities on foam balance beam. SBA for balance ex's, able to self correct LOB. Displays deficits in LLE strength as compared to RLE with single leg machine ex's. Issued HEP with pt demo understanding.    PT Treatment/Interventions ADLs/Self  Care Home Management;Gait training;Stair training;Functional mobility training;Therapeutic activities;Therapeutic exercise;Balance training;Neuromuscular re-education;Manual techniques;Patient/family education    PT Next Visit Plan start gym activities addressing LE and core strength    Consulted and Agree with Plan of Care Patient           Patient will benefit from skilled therapeutic intervention in order to improve the following deficits and impairments:  Abnormal gait,Decreased range of motion,Difficulty walking,Decreased activity tolerance,Cardiopulmonary status limiting activity,Decreased balance,Impaired flexibility,Decreased strength,Pain,Decreased mobility  Visit Diagnosis: Muscle weakness (generalized)  Difficulty in walking, not elsewhere classified     Problem List Patient Active Problem List   Diagnosis Date Noted  . Gait abnormality 11/03/2020  . Left-sided weakness 11/03/2020  . Chronic left shoulder pain 11/03/2020  . REM behavioral disorder 08/24/2020  . Coagulation defect (HCC) 07/06/2020  . Paroxysmal atrial fibrillation (HCC) 01/06/2019  . OSA (obstructive sleep apnea) 09/24/2011  . Chest pain 05/11/2011  . Hypertension 05/11/2011  . Obesity 05/11/2011  . Hyperlipidemia 05/11/2011   Lysle Rubens, PT, DPT Maryanna Shape  11/15/2020, 5:39 PM  [facility] [address] [place], [place], [address] Phone: [phone]   Fax:  [phone]  Name: Edwin Gallagher MRN: [id number] Date of Birth: 02/11/1956

## 2020-11-15 NOTE — Patient Instructions (Signed)
Access Code: [email or web address] URL: [email or web address] Date: 11/15/2020 Prepared by: Lysle Rubens  Exercises Sit to Stand without Arm Support - 1 x daily - 7 x weekly - 3 sets - 10 reps Standing Hip Abduction with Counter Support - 1 x daily - 7 x weekly - 3 sets - 10 reps Standing Hip Extension with Counter Support - 1 x daily - 7 x weekly - 3 sets - 10 reps Standing March with Counter Support - 1 x daily - 7 x weekly - 3 sets - 10 reps

## 2020-11-17 ENCOUNTER — Ambulatory Visit: Payer: 59 | Admitting: Physical Therapy

## 2020-11-17 ENCOUNTER — Other Ambulatory Visit: Payer: Self-pay

## 2020-11-17 ENCOUNTER — Encounter: Payer: Self-pay | Admitting: Physical Therapy

## 2020-11-17 DIAGNOSIS — M6281 Muscle weakness (generalized): Secondary | ICD-10-CM

## 2020-11-17 DIAGNOSIS — R262 Difficulty in walking, not elsewhere classified: Secondary | ICD-10-CM

## 2020-11-17 NOTE — Therapy (Signed)
[facility] [address] [place], [place], [address] Phone: [phone]   Fax:  [phone]  Physical Therapy Treatment  Patient Details  Name: Edwin Gallagher MRN: [id number] Date of Birth: 11-12-1956 Referring Provider (PT): Basil Dess Date: 11/17/2020   PT End of Session - 11/17/20 1226    Visit Number 3    Date for PT Re-Evaluation 01/11/21    PT Start Time 1145    PT Stop Time 1227    PT Time Calculation (min) 42 min    Activity Tolerance Patient tolerated treatment well    Behavior During Therapy [facility] for tasks assessed/performed           Past Medical History:  Diagnosis Date  . Abnormal cardiac CT angiography 2006   only abnormal with soft plaque in LAD calcium score is0   . Abnormal LFTs   . Balance problem   . CAD (coronary artery disease)    a. has seen Dr. Riley Kill in the past and had cardiac CT 2006 with calcium score of 0. He had soft plaque in LAD of about 50%.  He had a normal stress echo in 2012. He also had a normal nuc in 2016.  Marland Kitchen Chest discomfort   . Diabetes mellitus   . Forgetfulness   . Hyperlipidemia   . Hypertension   . Obesity   . OSA (obstructive sleep apnea)   . Paroxysmal atrial fibrillation (HCC)   . Tremor     Past Surgical History:  Procedure Laterality Date  . ROTATOR CUFF REPAIR  2009   Right  . TRIGGER FINGER RELEASE  02/12/2012   Procedure: MINOR RELEASE TRIGGER FINGER/A-1 PULLEY;  Surgeon: Wyn Forster., MD;  Location: [facility];  Service: Orthopedics;  Laterality: Right;  right thumb    There were no vitals filed for this visit.   Subjective Assessment - 11/17/20 1146    Subjective "Feeling pretty good for a change"    Currently in Pain? No/denies                             [facility] Adult PT Treatment/Exercise - 11/17/20 0001      High Level Balance   High Level Balance Activities Side stepping;Backward walking    High Level  Balance Comments side step on and off airex, step over foam rolls      Knee/Hip Exercises: Aerobic   Recumbent Bike L1.6    Nustep L5 x 6 min      Knee/Hip Exercises: Machines for Strengthening   Cybex Knee Extension 10lb x10, LLE 5lb 2x8    Cybex Knee Flexion 25lb 2x10, LLE 15lb 2x10      Knee/Hip Exercises: Standing   Forward Step Up 2 sets;Hand Hold: 2;Step Height: 6";5 reps;Both      Knee/Hip Exercises: Seated   Sit to Sand 2 sets;10 reps;without UE support   LE on airex                   PT Short Term Goals - 11/15/20 1736      PT SHORT TERM GOAL #1   Title Pt will be I with initial HEP    Time 2    Period Weeks    Status New             PT Long Term Goals - 11/10/20 1519      PT LONG TERM  GOAL #1   Title understand fall risks in the home    Time 8    Period Weeks    Status New      PT LONG TERM GOAL #2   Title increase Berg balance score to 53/56    Period Weeks    Status New      PT LONG TERM GOAL #3   Title increase left LE strength to 5/5    Time 8    Period Weeks    Status New      PT LONG TERM GOAL #4   Title walk without difficulty and without toe drag    Time 8    Period Weeks    Status New                 Plan - 11/17/20 1227    Clinical Impression Statement Pt did well overall today. Increase fatigue with sit to stands and step ups. Some LLE weakness noted with SL strengthening. Some instability on non compliant surface but it improves as reps progress.    Stability/Clinical Decision Making Evolving/Moderate complexity    Rehab Potential Good    PT Frequency 2x / week    PT Duration 8 weeks    PT Treatment/Interventions ADLs/Self Care Home Management;Gait training;Stair training;Functional mobility training;Therapeutic activities;Therapeutic exercise;Balance training;Neuromuscular re-education;Manual techniques;Patient/family education    PT Next Visit Plan start gym activities addressing LE and core strength            Patient will benefit from skilled therapeutic intervention in order to improve the following deficits and impairments:  Abnormal gait,Decreased range of motion,Difficulty walking,Decreased activity tolerance,Cardiopulmonary status limiting activity,Decreased balance,Impaired flexibility,Decreased strength,Pain,Decreased mobility  Visit Diagnosis: Difficulty in walking, not elsewhere classified  Muscle weakness (generalized)     Problem List Patient Active Problem List   Diagnosis Date Noted  . Gait abnormality 11/03/2020  . Left-sided weakness 11/03/2020  . Chronic left shoulder pain 11/03/2020  . REM behavioral disorder 08/24/2020  . Coagulation defect (HCC) 07/06/2020  . Paroxysmal atrial fibrillation (HCC) 01/06/2019  . OSA (obstructive sleep apnea) 09/24/2011  . Chest pain 05/11/2011  . Hypertension 05/11/2011  . Obesity 05/11/2011  . Hyperlipidemia 05/11/2011    Grayce Sessions, PTA 11/17/2020, 12:29 PM  [facility] [address] [place], [place], [address] Phone: [phone]   Fax:  [phone]  Name: Edwin Gallagher MRN: [id number] Date of Birth: 13-Nov-1955

## 2020-11-21 NOTE — Telephone Encounter (Signed)
Noted, I informed Taron at [facility].

## 2020-11-22 ENCOUNTER — Ambulatory Visit: Payer: 59 | Admitting: Physical Therapy

## 2020-11-22 ENCOUNTER — Encounter: Payer: Self-pay | Admitting: Physical Therapy

## 2020-11-22 ENCOUNTER — Other Ambulatory Visit: Payer: Self-pay

## 2020-11-22 DIAGNOSIS — M6281 Muscle weakness (generalized): Secondary | ICD-10-CM | POA: Diagnosis not present

## 2020-11-22 DIAGNOSIS — R262 Difficulty in walking, not elsewhere classified: Secondary | ICD-10-CM

## 2020-11-22 NOTE — Therapy (Signed)
[place]. [place], [place], [address] Phone: [phone]   Fax:  [phone]  Physical Therapy Treatment  Patient Details  Name: Edwin Gallagher MRN: [id number] Date of Birth: 09/11/1956 Referring Provider (PT): Fidel Levy Date: 11/22/2020   PT End of Session - 11/22/20 1225    Visit Number 4    Date for PT Re-Evaluation 01/11/21    PT Start Time 2353    PT Stop Time 1226    PT Time Calculation (min) 43 min           Past Medical History:  Diagnosis Date  . Abnormal cardiac CT angiography 2006   only abnormal with soft plaque in LAD calcium score is0   . Abnormal LFTs   . Balance problem   . CAD (coronary artery disease)    a. has seen Dr. Lia Foyer in the past and had cardiac CT 2006 with calcium score of 0. He had soft plaque in LAD of about 50%.  He had a normal stress echo in 2012. He also had a normal nuc in 2016.  Marland Kitchen Chest discomfort   . Diabetes mellitus   . Forgetfulness   . Hyperlipidemia   . Hypertension   . Obesity   . OSA (obstructive sleep apnea)   . Paroxysmal atrial fibrillation (HCC)   . Tremor     Past Surgical History:  Procedure Laterality Date  . ROTATOR CUFF REPAIR  2009   Right  . TRIGGER FINGER RELEASE  02/12/2012   Procedure: MINOR RELEASE TRIGGER FINGER/A-1 PULLEY;  Surgeon: Cammie Sickle., MD;  Location: [facility];  Service: Orthopedics;  Laterality: Right;  right thumb    There were no vitals filed for this visit.   Subjective Assessment - 11/22/20 1150    Subjective woke up at 2 am had to use restroom and was sore took pain pill, pills are still helping now.         0/10 pain                    [facility] Adult PT Treatment/Exercise - 11/22/20 0001      Exercises   Exercises Shoulder      Knee/Hip Exercises: Aerobic   Nustep L5 x 6 min      Knee/Hip Exercises: Machines for Strengthening   Cybex Knee Extension 10lb 2x15     Cybex Knee Flexion 25lb 2x10    Cybex Leg Press 40# 2x10 BLE      Knee/Hip Exercises: Seated   Sit to Sand 2 sets;10 reps;without UE support   OHP with yellow ball     Shoulder Exercises: Standing   Flexion Both;Strengthening;10 reps;Weights    Shoulder Flexion Weight (lbs) 2    ABduction Strengthening;Both;10 reps    Extension Weights;20 reps;Both;Strengthening    Extension Weight (lbs) 10      Shoulder Exercises: ROM/Strengthening   UBE (Upper Arm Bike) L1 1.5 min each    Other ROM/Strengthening Exercises Rows & Lats 25lb 2x10                    PT Short Term Goals - 11/22/20 1226      PT SHORT TERM GOAL #1   Title Pt will be I with initial HEP    Status Achieved             PT Long Term Goals - 11/22/20 1226      PT  LONG TERM GOAL #1   Title understand fall risks in the home    Status Partially Met      PT LONG TERM GOAL #2   Title increase Berg balance score to 53/56    Status On-going      PT LONG TERM GOAL #3   Title increase left LE strength to 5/5    Status On-going                 Plan - 11/22/20 1227    Clinical Impression Statement Pt did well with a progressed session. Added some postural strengthening intervention in both standing and sitting positions Cues needed to engage core with seated rows. no reports of pain, limited LUE ROM noted with OHP. Increase reps with leg curls and extension causes some burning fatigue.    Stability/Clinical Decision Making Evolving/Moderate complexity    Rehab Potential Good    PT Frequency 2x / week    PT Duration 8 weeks    PT Treatment/Interventions ADLs/Self Care Home Management;Gait training;Stair training;Functional mobility training;Therapeutic activities;Therapeutic exercise;Balance training;Neuromuscular re-education;Manual techniques;Patient/family education    PT Next Visit Plan gym activities addressing LE and core strength           Patient will benefit from skilled therapeutic  intervention in order to improve the following deficits and impairments:  Abnormal gait,Decreased range of motion,Difficulty walking,Decreased activity tolerance,Cardiopulmonary status limiting activity,Decreased balance,Impaired flexibility,Decreased strength,Pain,Decreased mobility  Visit Diagnosis: Muscle weakness (generalized)  Difficulty in walking, not elsewhere classified     Problem List Patient Active Problem List   Diagnosis Date Noted  . Gait abnormality 11/03/2020  . Left-sided weakness 11/03/2020  . Chronic left shoulder pain 11/03/2020  . REM behavioral disorder 08/24/2020  . Coagulation defect ([place]) 07/06/2020  . Paroxysmal atrial fibrillation ([place]) 01/06/2019  . OSA (obstructive sleep apnea) 09/24/2011  . Chest pain 05/11/2011  . Hypertension 05/11/2011  . Obesity 05/11/2011  . Hyperlipidemia 05/11/2011    Scot Jun 11/22/2020, 12:31 PM  [place]. [place], [place], [address] Phone: [phone]   Fax:  [phone]  Name: Edwin Gallagher MRN: [id number] Date of Birth: Jan 15, 1956

## 2020-11-24 ENCOUNTER — Ambulatory Visit: Payer: 59

## 2020-11-24 ENCOUNTER — Other Ambulatory Visit: Payer: Self-pay

## 2020-11-24 DIAGNOSIS — M6281 Muscle weakness (generalized): Secondary | ICD-10-CM | POA: Diagnosis not present

## 2020-11-24 DIAGNOSIS — R262 Difficulty in walking, not elsewhere classified: Secondary | ICD-10-CM

## 2020-11-24 NOTE — Therapy (Signed)
[place]. [place], [place], [address] Phone: [phone]   Fax:  [phone]  Physical Therapy Treatment  Patient Details  Name: Edwin Gallagher MRN: [id number] Date of Birth: [birth year] Referring Provider (PT): Fidel Levy Date: 11/24/2020   PT End of Session - 11/24/20 1151    Visit Number 5    Date for PT Re-Evaluation 01/11/21    PT Start Time 1100    PT Stop Time 1145    PT Time Calculation (min) 45 min    Activity Tolerance Patient tolerated treatment well    Behavior During Therapy [facility] for tasks assessed/performed           Past Medical History:  Diagnosis Date  . Abnormal cardiac CT angiography 2006   only abnormal with soft plaque in LAD calcium score is0   . Abnormal LFTs   . Balance problem   . CAD (coronary artery disease)    a. has seen Dr. Lia Foyer in the past and had cardiac CT 2006 with calcium score of 0. He had soft plaque in LAD of about 50%.  He had a normal stress echo in 2012. He also had a normal nuc in 2016.  Marland Kitchen Chest discomfort   . Diabetes mellitus   . Forgetfulness   . Hyperlipidemia   . Hypertension   . Obesity   . OSA (obstructive sleep apnea)   . Paroxysmal atrial fibrillation (HCC)   . Tremor     Past Surgical History:  Procedure Laterality Date  . ROTATOR CUFF REPAIR  2009   Right  . TRIGGER FINGER RELEASE  02/12/2012   Procedure: MINOR RELEASE TRIGGER FINGER/A-1 PULLEY;  Surgeon: Cammie Sickle., MD;  Location: [facility];  Service: Orthopedics;  Laterality: Right;  right thumb    There were no vitals filed for this visit.   Subjective Assessment - 11/24/20 1102    Subjective No new complaints today.    Currently in Pain? No/denies                             [facility] Adult PT Treatment/Exercise - 11/24/20 0001      Knee/Hip Exercises: Aerobic   Nustep L5 x 6 min   cues given for pacing     Knee/Hip Exercises: Standing    Other Standing Knee Exercises Pre-gait: in parallel bars step over balance beam + heel tap with CGA intermittent 1 UE assist 10x2      Knee/Hip Exercises: Seated   Sit to Sand 2 sets;10 reps from mat surface without UE support,  10x2 with right foot elevated on 6" step while holding yellow medicine ball     Shoulder Exercises: ROM/Strengthening   UBE (Upper Arm Bike) L1- 2 min fwd and retro    Other ROM/Strengthening Exercises Rows & Lats 25lb 2x10  Mobilization with movement shoulder flexion and abduction x10 with 3-5 second holds at end range                   PT Short Term Goals - 11/22/20 1226      PT SHORT TERM GOAL #1   Title Pt will be I with initial HEP    Status Achieved             PT Long Term Goals - 11/22/20 1226      PT LONG TERM GOAL #1   Title understand fall  risks in the home    Status Partially Met      PT LONG TERM GOAL #2   Title increase Berg balance score to 53/56    Status On-going      PT LONG TERM GOAL #3   Title increase left LE strength to 5/5    Status On-going                 Plan - 11/24/20 1152    Clinical Impression Statement Pt did well with exercises and activities today. With seated shoulder ABD, modified mobilization with movement performed with good carrryover of increased active range to about 120deg. Increased time on UBE to 2 minutes with good tolerance. Staggered sit to stands trialed with good tolerance and some fatigue noted after 2nd set. Pre-gait stepping over balance beam in parallel bars was challenging without UE support and required close guarding and intermittent hand hold onto bars for balance.    Rehab Potential Good    PT Frequency 2x / week    PT Duration 8 weeks    PT Treatment/Interventions ADLs/Self Care Home Management;Gait training;Stair training;Functional mobility training;Therapeutic activities;Therapeutic exercise;Balance training;Neuromuscular re-education;Manual techniques;Patient/family  education    PT Next Visit Plan gym activities addressing LE and core strength, progress balance activities as tolerated    Consulted and Agree with Plan of Care Patient           Patient will benefit from skilled therapeutic intervention in order to improve the following deficits and impairments:  Abnormal gait,Decreased range of motion,Difficulty walking,Decreased activity tolerance,Cardiopulmonary status limiting activity,Decreased balance,Impaired flexibility,Decreased strength,Pain,Decreased mobility  Visit Diagnosis: Muscle weakness (generalized)  Difficulty in walking, not elsewhere classified     Problem List Patient Active Problem List   Diagnosis Date Noted  . Gait abnormality 11/03/2020  . Left-sided weakness 11/03/2020  . Chronic left shoulder pain 11/03/2020  . REM behavioral disorder 08/24/2020  . Coagulation defect ([place]) 07/06/2020  . Paroxysmal atrial fibrillation ([place]) 01/06/2019  . OSA (obstructive sleep apnea) 09/24/2011  . Chest pain 05/11/2011  . Hypertension 05/11/2011  . Obesity 05/11/2011  . Hyperlipidemia 05/11/2011    Hall Busing, PT, DPT 11/24/2020, 12:04 PM  [place]. [place], [place], [address] Phone: [phone]   Fax:  [phone]  Name: Edwin Gallagher MRN: [id number] Date of Birth: 21-Dec-1955

## 2020-11-28 ENCOUNTER — Ambulatory Visit: Payer: 59 | Admitting: Orthopedic Surgery

## 2020-11-29 ENCOUNTER — Encounter: Payer: Self-pay | Admitting: Physical Therapy

## 2020-11-29 ENCOUNTER — Ambulatory Visit: Payer: 59 | Admitting: Physical Therapy

## 2020-11-29 ENCOUNTER — Other Ambulatory Visit: Payer: Self-pay

## 2020-11-29 DIAGNOSIS — M6281 Muscle weakness (generalized): Secondary | ICD-10-CM | POA: Diagnosis not present

## 2020-11-29 DIAGNOSIS — R262 Difficulty in walking, not elsewhere classified: Secondary | ICD-10-CM

## 2020-11-29 NOTE — Therapy (Signed)
[place]. [place], [place], [address] Phone: [phone]   Fax:  [phone]  Physical Therapy Treatment  Patient Details  Name: Edwin Gallagher MRN: [id number] Date of Birth: 05-31-1956 Referring Provider (PT): Fidel Levy Date: 11/29/2020   PT End of Session - 11/29/20 1226    Visit Number 6    Date for PT Re-Evaluation 01/11/21    PT Start Time 1151    PT Stop Time 1230    PT Time Calculation (min) 39 min    Activity Tolerance Patient tolerated treatment well    Behavior During Therapy [facility] for tasks assessed/performed           Past Medical History:  Diagnosis Date  . Abnormal cardiac CT angiography 2006   only abnormal with soft plaque in LAD calcium score is0   . Abnormal LFTs   . Balance problem   . CAD (coronary artery disease)    a. has seen Dr. Lia Foyer in the past and had cardiac CT 2006 with calcium score of 0. He had soft plaque in LAD of about 50%.  He had a normal stress echo in 2012. He also had a normal nuc in 2016.  Marland Kitchen Chest discomfort   . Diabetes mellitus   . Forgetfulness   . Hyperlipidemia   . Hypertension   . Obesity   . OSA (obstructive sleep apnea)   . Paroxysmal atrial fibrillation (HCC)   . Tremor     Past Surgical History:  Procedure Laterality Date  . ROTATOR CUFF REPAIR  2009   Right  . TRIGGER FINGER RELEASE  02/12/2012   Procedure: MINOR RELEASE TRIGGER FINGER/A-1 PULLEY;  Surgeon: Cammie Sickle., MD;  Location: [facility];  Service: Orthopedics;  Laterality: Right;  right thumb    There were no vitals filed for this visit.   Subjective Assessment - 11/29/20 1156    Subjective Pt reports a fall this morning tripping over shoe in the door way, report some soreness in butt bone and the back side of rib cage on R side. No other injuries reported.    Currently in Pain? No/denies    Pain Location Coccyx    Pain Descriptors / Indicators Sore                              [facility] Adult PT Treatment/Exercise - 11/29/20 0001      Knee/Hip Exercises: Aerobic   Nustep L5 x 6 min      Knee/Hip Exercises: Machines for Strengthening   Cybex Knee Extension 10lb 2x15    Cybex Knee Flexion 25lb 2x10      Knee/Hip Exercises: Standing   Forward Step Up Both;1 set;10 reps;Step Height: 6";Hand Hold: 0    Other Standing Knee Exercises Alr 6 in box taps 2x10      Knee/Hip Exercises: Seated   Other Seated Knee/Hip Exercises Seated trunk rotation  blue ball    Sit to Sand 2 sets;10 reps;without UE support   holding blue ball     Shoulder Exercises: Seated   Other Seated Exercises OHP blue ball 2x10 cues for core engagement                    PT Short Term Goals - 11/22/20 1226      PT SHORT TERM GOAL #1   Title Pt will be I with initial HEP  Status Achieved             PT Long Term Goals - 11/22/20 1226      PT LONG TERM GOAL #1   Title understand fall risks in the home    Status Partially Met      PT LONG TERM GOAL #2   Title increase Berg balance score to 53/56    Status On-going      PT LONG TERM GOAL #3   Title increase left LE strength to 5/5    Status On-going                 Plan - 11/29/20 1227    Clinical Impression Statement Pt ~ 6 minutes late, reporting he has a fall at 1 AM this morning. Some fatigue noted throughout session.  Cues to keep feet even with sit to stands. Cues needed for core engagement with seated OHP. Tactile cues for posture given with standing shoulder Ext to prevent forward leaning. Some instability noted with alt box taps.    Stability/Clinical Decision Making Evolving/Moderate complexity    Rehab Potential Good    PT Frequency 2x / week    PT Duration 8 weeks    PT Treatment/Interventions ADLs/Self Care Home Management;Gait training;Stair training;Functional mobility training;Therapeutic activities;Therapeutic exercise;Balance training;Neuromuscular  re-education;Manual techniques;Patient/family education    PT Next Visit Plan gym activities addressing LE and core strength, progress balance activities as tolerated           Patient will benefit from skilled therapeutic intervention in order to improve the following deficits and impairments:  Abnormal gait,Decreased range of motion,Difficulty walking,Decreased activity tolerance,Cardiopulmonary status limiting activity,Decreased balance,Impaired flexibility,Decreased strength,Pain,Decreased mobility  Visit Diagnosis: Difficulty in walking, not elsewhere classified  Muscle weakness (generalized)     Problem List Patient Active Problem List   Diagnosis Date Noted  . Gait abnormality 11/03/2020  . Left-sided weakness 11/03/2020  . Chronic left shoulder pain 11/03/2020  . REM behavioral disorder 08/24/2020  . Coagulation defect ([place]) 07/06/2020  . Paroxysmal atrial fibrillation ([place]) 01/06/2019  . OSA (obstructive sleep apnea) 09/24/2011  . Chest pain 05/11/2011  . Hypertension 05/11/2011  . Obesity 05/11/2011  . Hyperlipidemia 05/11/2011    Edwin Gallagher, PTA 11/29/2020, 12:32 PM  [place]. [place], [place], [address] Phone: [phone]   Fax:  [phone]  Name: Edwin Gallagher MRN: [id number] Date of Birth: Jun 02, 1956

## 2020-12-01 ENCOUNTER — Ambulatory Visit: Payer: 59

## 2020-12-01 ENCOUNTER — Other Ambulatory Visit: Payer: Self-pay

## 2020-12-01 DIAGNOSIS — M6281 Muscle weakness (generalized): Secondary | ICD-10-CM | POA: Diagnosis not present

## 2020-12-01 DIAGNOSIS — R262 Difficulty in walking, not elsewhere classified: Secondary | ICD-10-CM

## 2020-12-01 NOTE — Therapy (Signed)
[place]. [place], [place], [address] Phone: [phone]   Fax:  [phone]  Physical Therapy Treatment  Patient Details  Name: Edwin Gallagher MRN: [id number] Date of Birth: 11/17/1955 Referring Provider (PT): Fidel Levy Date: 12/01/2020   PT End of Session - 12/01/20 1334    Visit Number 7    Date for PT Re-Evaluation 01/11/21    PT Start Time 7741    PT Stop Time 1400    PT Time Calculation (min) 31 min           Past Medical History:  Diagnosis Date  . Abnormal cardiac CT angiography 2006   only abnormal with soft plaque in LAD calcium score is0   . Abnormal LFTs   . Balance problem   . CAD (coronary artery disease)    a. has seen Dr. Lia Foyer in the past and had cardiac CT 2006 with calcium score of 0. He had soft plaque in LAD of about 50%.  He had a normal stress echo in 2012. He also had a normal nuc in 2016.  Marland Kitchen Chest discomfort   . Diabetes mellitus   . Forgetfulness   . Hyperlipidemia   . Hypertension   . Obesity   . OSA (obstructive sleep apnea)   . Paroxysmal atrial fibrillation (HCC)   . Tremor     Past Surgical History:  Procedure Laterality Date  . ROTATOR CUFF REPAIR  2009   Right  . TRIGGER FINGER RELEASE  02/12/2012   Procedure: MINOR RELEASE TRIGGER FINGER/A-1 PULLEY;  Surgeon: Cammie Sickle., MD;  Location: [facility];  Service: Orthopedics;  Laterality: Right;  right thumb    There were no vitals filed for this visit.   Subjective Assessment - 12/01/20 1331    Subjective Tailbone is still sore from fall the other day but ribcage is better. Otherwise no new complaints.    Limitations Standing;Walking;House hold activities    Patient Stated Goals walk better, be stronger    Currently in Pain? No/denies    Pain Location Coccyx    Pain Descriptors / Indicators Sore             [facility] Adult PT Treatment/Exercise - 12/01/20 0001      High Level Balance    High Level Balance Comments ( all completed with With no to 1 UE support) Standing marches on Airex x 30 Step taps to 6" step x 30    Step taps to 6" step from airex x 30  Modified SLS with foot on med ball 20"x2 each leg   in parallel bars     Knee/Hip Exercises: Aerobic   Nustep L5 x 6 min      Knee/Hip Exercises: Machines for Strengthening   Cybex Knee Extension 15lb 2x15    Cybex Knee Flexion 25lb 2x15      Knee/Hip Exercises: Seated   Sit to Sand 2 sets;10 reps;without UE support      Shoulder Exercises: ROM/Strengthening   UBE (Upper Arm Bike) L1.5 1 min fwd, 1 min bwd    Other ROM/Strengthening Exercises Rows & Lats 25lb 2x10 Standing shoulder ext with yellow TB 10 x 2                    PT Short Term Goals - 11/22/20 1226      PT SHORT TERM GOAL #1   Title Pt will be I with initial HEP  Status Achieved             PT Long Term Goals - 11/22/20 1226      PT LONG TERM GOAL #1   Title understand fall risks in the home    Status Partially Met      PT LONG TERM GOAL #2   Title increase Berg balance score to 53/56    Status On-going      PT LONG TERM GOAL #3   Title increase left LE strength to 5/5    Status On-going                 Plan - 12/01/20 1616    Clinical Impression Statement Pt 15 minutes late to session today, shortened session. He  did nicely with progression of resistance for strengtheing exercises. He demo'd moderate sway with standing balance exercises in parallel bars, most difficulty noted with modified LSS, marching on uneven surface and step taps, occassional 1 hand on parallel bars for balance as needed. He will benefit from continued progressive strength and balance training.    Stability/Clinical Decision Making Evolving/Moderate complexity    Clinical Decision Making Low    Rehab Potential Good    PT Frequency 2x / week    PT Duration 8 weeks    PT Treatment/Interventions ADLs/Self Care Home Management;Gait  training;Stair training;Functional mobility training;Therapeutic activities;Therapeutic exercise;Balance training;Neuromuscular re-education;Manual techniques;Patient/family education    PT Next Visit Plan gym activities addressing LE and core strength, progress balance activities as tolerated    Consulted and Agree with Plan of Care Patient           Patient will benefit from skilled therapeutic intervention in order to improve the following deficits and impairments:  Abnormal gait,Decreased range of motion,Difficulty walking,Decreased activity tolerance,Cardiopulmonary status limiting activity,Decreased balance,Impaired flexibility,Decreased strength,Pain,Decreased mobility  Visit Diagnosis: Difficulty in walking, not elsewhere classified  Muscle weakness (generalized)     Problem List Patient Active Problem List   Diagnosis Date Noted  . Gait abnormality 11/03/2020  . Left-sided weakness 11/03/2020  . Chronic left shoulder pain 11/03/2020  . REM behavioral disorder 08/24/2020  . Coagulation defect ([place]) 07/06/2020  . Paroxysmal atrial fibrillation ([place]) 01/06/2019  . OSA (obstructive sleep apnea) 09/24/2011  . Chest pain 05/11/2011  . Hypertension 05/11/2011  . Obesity 05/11/2011  . Hyperlipidemia 05/11/2011    Hall Busing, PT, DPT 12/01/2020, 4:20 PM  [place]. [place], [place], [address] Phone: [phone]   Fax:  [phone]  Name: Edwin Gallagher MRN: [id number] Date of Birth: Mar 19, 1956

## 2020-12-02 ENCOUNTER — Ambulatory Visit
Admission: RE | Admit: 2020-12-02 | Discharge: 2020-12-02 | Disposition: A | Discharge status: Home / Self Care | Payer: 59 | Source: Ambulatory Visit | Attending: Neurology | Admitting: Neurology

## 2020-12-02 DIAGNOSIS — R531 Weakness: Secondary | ICD-10-CM

## 2020-12-02 DIAGNOSIS — R269 Unspecified abnormalities of gait and mobility: Secondary | ICD-10-CM

## 2020-12-05 ENCOUNTER — Ambulatory Visit: Payer: 59 | Admitting: Orthopedic Surgery

## 2020-12-05 ENCOUNTER — Telehealth: Payer: Self-pay | Admitting: Neurology

## 2020-12-05 NOTE — Telephone Encounter (Signed)
I spoke to the patient and provided him with the MRI brain results. He expressed understanding. He would like to review in more detail at his next appt on 01/05/21.

## 2020-12-05 NOTE — Telephone Encounter (Signed)
  IMPRESSION: This MRI of the brain without contrast shows the following: 1.   Multiple T2/FLAIR hyperintense foci in the subcortical and deep white matter of the hemispheres.  None of these appear to be acute.  This is most consistent with moderate chronic microvascular ischemic change.  Demyelination would be much less likely. 2.   No acute findings.    Please call patient, MRI of the brain showed multiple supratentorium small vessel disease, no acute abnormality.

## 2020-12-06 ENCOUNTER — Ambulatory Visit: Payer: 59

## 2020-12-06 ENCOUNTER — Other Ambulatory Visit: Payer: Self-pay

## 2020-12-06 DIAGNOSIS — M6281 Muscle weakness (generalized): Secondary | ICD-10-CM

## 2020-12-06 DIAGNOSIS — R262 Difficulty in walking, not elsewhere classified: Secondary | ICD-10-CM

## 2020-12-06 NOTE — Therapy (Signed)
[place]. [place], [place], [address] Phone: [phone]   Fax:  [phone]  Physical Therapy Treatment  Patient Details  Name: Edwin Gallagher MRN: [id number] Date of Birth: 10-07-1956 Referring Provider (PT): Fidel Levy Date: 12/06/2020   PT End of Session - 12/06/20 1324    Visit Number 8    Date for PT Re-Evaluation 01/11/21    PT Start Time 1315    PT Stop Time 1400    PT Time Calculation (min) 45 min    Activity Tolerance Patient tolerated treatment well    Behavior During Therapy [name] for tasks assessed/performed           Past Medical History:  Diagnosis Date  . Abnormal cardiac CT angiography 2006   only abnormal with soft plaque in LAD calcium score is0   . Abnormal LFTs   . Balance problem   . CAD (coronary artery disease)    a. has seen Dr. Lia Foyer in the past and had cardiac CT 2006 with calcium score of 0. He had soft plaque in LAD of about 50%.  He had a normal stress echo in 2012. He also had a normal nuc in 2016.  Marland Kitchen Chest discomfort   . Diabetes mellitus   . Forgetfulness   . Hyperlipidemia   . Hypertension   . Obesity   . OSA (obstructive sleep apnea)   . Paroxysmal atrial fibrillation (HCC)   . Tremor     Past Surgical History:  Procedure Laterality Date  . ROTATOR CUFF REPAIR  2009   Right  . TRIGGER FINGER RELEASE  02/12/2012   Procedure: MINOR RELEASE TRIGGER FINGER/A-1 PULLEY;  Surgeon: Cammie Sickle., MD;  Location: [facility];  Service: Orthopedics;  Laterality: Right;  right thumb    There were no vitals filed for this visit.   Subjective Assessment - 12/06/20 1317    Subjective Tailbone still very slightly sore but otherise doing okay. had MRI  of head - pt thinks its called small vessel disease but otherwise no concern for stroke. will be following up again next month 01/05/21. Scheduled to see orthopedic MD next thursday.Still gets tired fast     Limitations Standing;Walking;House hold activities    Patient Stated Goals walk better, be stronger    Currently in Pain? No/denies                             [facility] Adult PT Treatment/Exercise - 12/06/20 0001      High Level Balance   High Level Balance Comments Standing marches on Airex x 30 Step taps to 8" step from airex x 30,  Modified SLS with foot on med ball 30"x2 each leg, SLS EC 30"  Standing hip abd 10 x 2 Standing hip ext 10 x 2  Sit to stands 10 x 2 from mat Squat to stands 10 x 2 with yellow med ball ,standard chair behind for safety Banded marches in standing 10 x 2 Semi Tandem balance 10" x 2 B SLS with intermittent UE assist 10" x 2      Knee/Hip Exercises: Aerobic   Nustep L5 x 6 min      Knee/Hip Exercises: Standing   Forward Step Up Both;1 set;10 reps;Step Height: 6";Hand Hold: 0    Other Standing Knee Exercises 8" heel taps alternating 1 minute      Shoulder Exercises: ROM/Strengthening  UBE (Upper Arm Bike) L1.5, 2 min fwd, 2 min bwd                    PT Short Term Goals - 11/22/20 1226      PT SHORT TERM GOAL #1   Title Pt will be I with initial HEP    Status Achieved             PT Long Term Goals - 11/22/20 1226      PT LONG TERM GOAL #1   Title understand fall risks in the home    Status Partially Met      PT LONG TERM GOAL #2   Title increase Berg balance score to 53/56    Status On-going      PT LONG TERM GOAL #3   Title increase left LE strength to 5/5    Status On-going                 Plan - 12/06/20 1833    Clinical Impression Statement Pt tolerated exercises nicely today. Improved tolerance to retro UBE today. Updated HEP provided today as pt reports hes only been completing 10 reps of STS exercises at home. Pt had difficulty with balance activities on foam in parallel bars but did nicely with CGA, intermittent touch onto paralle bars as needed. Continue per POC with progressive balance  and strength training, as well as working on endurance.    Rehab Potential Good    PT Frequency 2x / week    PT Duration 8 weeks    PT Treatment/Interventions ADLs/Self Care Home Management;Gait training;Stair training;Functional mobility training;Therapeutic activities;Therapeutic exercise;Balance training;Neuromuscular re-education;Manual techniques;Patient/family education    PT Next Visit Plan gym activities addressing LE and core strength, progress balance activities as tolerated    Consulted and Agree with Plan of Care Patient           Patient will benefit from skilled therapeutic intervention in order to improve the following deficits and impairments:  Abnormal gait,Decreased range of motion,Difficulty walking,Decreased activity tolerance,Cardiopulmonary status limiting activity,Decreased balance,Impaired flexibility,Decreased strength,Pain,Decreased mobility  Visit Diagnosis: Difficulty in walking, not elsewhere classified  Muscle weakness (generalized)     Problem List Patient Active Problem List   Diagnosis Date Noted  . Gait abnormality 11/03/2020  . Left-sided weakness 11/03/2020  . Chronic left shoulder pain 11/03/2020  . REM behavioral disorder 08/24/2020  . Coagulation defect ([place]) 07/06/2020  . Paroxysmal atrial fibrillation ([place]) 01/06/2019  . OSA (obstructive sleep apnea) 09/24/2011  . Chest pain 05/11/2011  . Hypertension 05/11/2011  . Obesity 05/11/2011  . Hyperlipidemia 05/11/2011    Hall Busing, PT, DPT 12/06/2020, 6:36 PM  [place]. [place], [place], [address] Phone: [phone]   Fax:  [phone]  Name: Edwin Gallagher MRN: [id number] Date of Birth: Sep 16, 1956

## 2020-12-06 NOTE — Patient Instructions (Signed)
Access Code: Vivien Rota URL: [email or web address] Date: 12/06/2020 Prepared by: Claude Manges  Exercises Sit to Stand without Arm Support- 1 x daily - 7 x weekly - 3 sets - 10 reps Standing Hip Abduction with Counter Support - 1 x daily - 7 x weekly - 3 sets - 10 reps

## 2020-12-09 ENCOUNTER — Ambulatory Visit: Payer: 59 | Admitting: Physical Therapy

## 2020-12-13 ENCOUNTER — Ambulatory Visit: Payer: 59 | Attending: Neurology | Admitting: Physical Therapy

## 2020-12-13 ENCOUNTER — Other Ambulatory Visit: Payer: Self-pay

## 2020-12-13 ENCOUNTER — Encounter: Payer: Self-pay | Admitting: Physical Therapy

## 2020-12-13 DIAGNOSIS — R262 Difficulty in walking, not elsewhere classified: Secondary | ICD-10-CM | POA: Diagnosis present

## 2020-12-13 DIAGNOSIS — M6281 Muscle weakness (generalized): Secondary | ICD-10-CM | POA: Diagnosis not present

## 2020-12-13 NOTE — Therapy (Signed)
[facility]. [place], [place], [address] Phone: [phone]   Fax:  [phone]  Physical Therapy Treatment  Patient Details  Name: Edwin Gallagher MRN: [id number] Date of Birth: 07-31-1956 Referring Provider (PT): Fidel Levy Date: 12/13/2020   PT End of Session - 12/13/20 1350    Visit Number 9    Date for PT Re-Evaluation 01/11/21    PT Start Time 1306    PT Stop Time 1346    PT Time Calculation (min) 40 min    Activity Tolerance Patient tolerated treatment well    Behavior During Therapy [facility] for tasks assessed/performed           Past Medical History:  Diagnosis Date  . Abnormal cardiac CT angiography 2006   only abnormal with soft plaque in LAD calcium score is0   . Abnormal LFTs   . Balance problem   . CAD (coronary artery disease)    a. has seen Dr. Lia Foyer in the past and had cardiac CT 2006 with calcium score of 0. He had soft plaque in LAD of about 50%.  He had a normal stress echo in 2012. He also had a normal nuc in 2016.  Marland Kitchen Chest discomfort   . Diabetes mellitus   . Forgetfulness   . Hyperlipidemia   . Hypertension   . Obesity   . OSA (obstructive sleep apnea)   . Paroxysmal atrial fibrillation (HCC)   . Tremor     Past Surgical History:  Procedure Laterality Date  . ROTATOR CUFF REPAIR  2009   Right  . TRIGGER FINGER RELEASE  02/12/2012   Procedure: MINOR RELEASE TRIGGER FINGER/A-1 PULLEY;  Surgeon: Cammie Sickle., MD;  Location: [facility];  Service: Orthopedics;  Laterality: Right;  right thumb    There were no vitals filed for this visit.   Subjective Assessment - 12/13/20 1309    Subjective "I feel fie, just been doing a lot today"    Currently in Pain? No/denies              [facility] PT Assessment - 12/13/20 0001      Strength   Left Hip Flexion 4/5    Left Hip ABduction 4/5    Left Hip ADduction 4-/5    Left Knee Flexion 4-/5    Left Knee Extension  4/5      Berg Balance Test   Sit to Stand Able to stand without using hands and stabilize independently    Standing Unsupported Able to stand safely 2 minutes    Sitting with Back Unsupported but Feet Supported on Floor or Stool Able to sit safely and securely 2 minutes    Stand to Sit Sits safely with minimal use of hands    Transfers Able to transfer safely, minor use of hands    Standing Unsupported with Eyes Closed Able to stand 10 seconds safely    Standing Unsupported with Feet Together Able to place feet together independently and stand 1 minute safely    From Standing, Reach Forward with Outstretched Arm Can reach confidently >25 cm (10")    From Standing Position, Pick up Object from Floor Able to pick up shoe safely and easily    From Standing Position, Turn to Look Behind Over each Shoulder Looks behind from both sides and weight shifts well    Turn 360 Degrees Able to turn 360 degrees safely in 4 seconds or less  Standing Unsupported, Alternately Place Feet on Step/Stool Able to stand independently and safely and complete 8 steps in 20 seconds    Standing Unsupported, One Foot in Front Able to take small step independently and hold 30 seconds    Standing on One Leg Able to lift leg independently and hold 5-10 seconds    Total Score 53                         [facility] Adult PT Treatment/Exercise - 12/13/20 0001      Knee/Hip Exercises: Aerobic   Nustep L5 x 6 min      Knee/Hip Exercises: Machines for Strengthening   Cybex Knee Extension 5lb 2x10    Cybex Knee Flexion LLE 15lb 2x10      Knee/Hip Exercises: Standing   Walking with Sports Cord 30lb 4 way x 3 each      Shoulder Exercises: ROM/Strengthening   UBE (Upper Arm Bike) L1.5, 2 min fwd, 2 min bwd                    PT Short Term Goals - 11/22/20 1226      PT SHORT TERM GOAL #1   Title Pt will be I with initial HEP    Status Achieved             PT Long Term Goals - 12/13/20 1331       PT LONG TERM GOAL #3   Title increase left LE strength to 5/5    Status On-going      PT LONG TERM GOAL #4   Title walk without difficulty and without toe drag    Status Partially Met                 Plan - 12/13/20 1351    Clinical Impression Statement Pt has progressed increasing his BERG balance score meeting goal. He has increase his LLE strength some but it remains weak overall. Some struggle with SL strengthening interventions. Instability noted with resisted side steps.    Stability/Clinical Decision Making Evolving/Moderate complexity    Rehab Potential Good    PT Frequency 2x / week    PT Duration 8 weeks    PT Treatment/Interventions ADLs/Self Care Home Management;Gait training;Stair training;Functional mobility training;Therapeutic activities;Therapeutic exercise;Balance training;Neuromuscular re-education;Manual techniques;Patient/family education    PT Next Visit Plan gym activities addressing LE and core strength, progress balance activities as tolerated           Patient will benefit from skilled therapeutic intervention in order to improve the following deficits and impairments:  Abnormal gait,Decreased range of motion,Difficulty walking,Decreased activity tolerance,Cardiopulmonary status limiting activity,Decreased balance,Impaired flexibility,Decreased strength,Pain,Decreased mobility  Visit Diagnosis: Muscle weakness (generalized)  Difficulty in walking, not elsewhere classified     Problem List Patient Active Problem List   Diagnosis Date Noted  . Gait abnormality 11/03/2020  . Left-sided weakness 11/03/2020  . Chronic left shoulder pain 11/03/2020  . REM behavioral disorder 08/24/2020  . Coagulation defect ([place]) 07/06/2020  . Paroxysmal atrial fibrillation ([place]) 01/06/2019  . OSA (obstructive sleep apnea) 09/24/2011  . Chest pain 05/11/2011  . Hypertension 05/11/2011  . Obesity 05/11/2011  . Hyperlipidemia 05/11/2011    Scot Jun, PTA 12/13/2020, 1:53 PM  [place]. [place], [place], [address] Phone: [phone]   Fax:  [phone]  Name: Edwin Gallagher MRN: [id number] Date of Birth: 11/13/1955

## 2020-12-14 ENCOUNTER — Ambulatory Visit (INDEPENDENT_AMBULATORY_CARE_PROVIDER_SITE_OTHER): Payer: 59

## 2020-12-14 ENCOUNTER — Encounter: Payer: Self-pay | Admitting: Podiatry

## 2020-12-14 ENCOUNTER — Ambulatory Visit (INDEPENDENT_AMBULATORY_CARE_PROVIDER_SITE_OTHER): Payer: 59 | Admitting: Orthopedic Surgery

## 2020-12-14 ENCOUNTER — Ambulatory Visit (INDEPENDENT_AMBULATORY_CARE_PROVIDER_SITE_OTHER): Payer: 59 | Admitting: Podiatry

## 2020-12-14 DIAGNOSIS — E1140 Type 2 diabetes mellitus with diabetic neuropathy, unspecified: Secondary | ICD-10-CM

## 2020-12-14 DIAGNOSIS — M79675 Pain in left toe(s): Secondary | ICD-10-CM

## 2020-12-14 DIAGNOSIS — B351 Tinea unguium: Secondary | ICD-10-CM

## 2020-12-14 DIAGNOSIS — M25512 Pain in left shoulder: Secondary | ICD-10-CM

## 2020-12-14 DIAGNOSIS — E1149 Type 2 diabetes mellitus with other diabetic neurological complication: Secondary | ICD-10-CM

## 2020-12-14 DIAGNOSIS — M25562 Pain in left knee: Secondary | ICD-10-CM

## 2020-12-14 DIAGNOSIS — M5410 Radiculopathy, site unspecified: Secondary | ICD-10-CM | POA: Diagnosis not present

## 2020-12-14 DIAGNOSIS — D689 Coagulation defect, unspecified: Secondary | ICD-10-CM

## 2020-12-14 DIAGNOSIS — M79674 Pain in right toe(s): Secondary | ICD-10-CM

## 2020-12-14 NOTE — Progress Notes (Signed)
This patient returns to my office for at risk foot care.  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  This patient is unable to cut nails himself since the patient cannot reach his nails.These nails are painful walking and wearing shoes.  This patient presents for at risk foot care today.  General Appearance  Alert, conversant and in no acute stress.  Vascular  Dorsalis pedis and posterior tibial  pulses are palpable  bilaterally.  Capillary return is within normal limits  bilaterally. Temperature is within normal limits  bilaterally.  Neurologic  Senn-Weinstein monofilament wire test within normal limits  bilaterally. Muscle power within normal limits bilaterally.  Nails Thick disfigured discolored nails with subungual debris  from hallux to fifth toes bilaterally. No evidence of bacterial infection or drainage bilaterally.  Orthopedic  No limitations of motion  feet .  No crepitus or effusions noted.  No bony pathology or digital deformities noted. Mild HAV  B/L.  ADV 5th digit  B/L.  Skin  normotropic skin with no porokeratosis noted bilaterally.  No signs of infections or ulcers noted.     Onychomycosis  Pain in right toes  Pain in left toes  Consent was obtained for treatment procedures.   Mechanical debridement of nails 1-5  bilaterally performed with a nail nipper.  Filed with dremel without incident.    Return office visit     3 months                 Told patient to return for periodic foot care and evaluation due to potential at risk complications.     DPM  

## 2020-12-16 ENCOUNTER — Ambulatory Visit: Payer: 59 | Admitting: Physical Therapy

## 2020-12-16 ENCOUNTER — Encounter: Payer: Self-pay | Admitting: Orthopedic Surgery

## 2020-12-16 NOTE — Progress Notes (Signed)
Office Visit Note   Patient: Edwin Gallagher           Date of Birth: 1955-11-26           MRN: [id number] Visit Date: 12/14/2020 Requested by: Charlane Ferretti, DO [address] [place],  [place] [address] PCP: Charlane Ferretti, DO  Subjective: Chief Complaint  Patient presents with  . Left Shoulder - Pain  . Left Leg - Pain    HPI: Edwin Gallagher is a 65 year old patient with left arm and shoulder pain.  The pain wakes him from sleep at night.  No history of injury.  Denies any coarse grinding or mechanical symptoms in that left shoulder.  Has not had intervention in the shoulder.  He also describes left knee pain.  Some difficulty with ambulation.  Reports lateral sided pain with some weakness.  Denies any locking or popping.  He also reports low back pain with radicular pain radiating down the left leg.  He feels like he is dragging his left foot.  Reports decreased balance and coordination issues.  His last hemoglobin A1c was between 6 and 7.Tramadol helps occasionally..  He has had an MRI of the brain which shows moderate chronic microvascular disease.              ROS: All systems reviewed are negative as they relate to the chief complaint within the history of present illness.  Patient denies  fevers or chills.   Assessment & Plan: Visit Diagnoses:  1. Left knee pain, unspecified chronicity   2. Left shoulder pain, unspecified chronicity   3. Radicular leg pain     Plan: Impression is fairly normal shoulder and knee exams and radiographs.  He does have a little bit of a foot drop when he walks but he has pretty reasonable dorsiflexion strength on manual motor testing.  Hip flexion strength is slightly weaker on the left compared to the right.  Plan MRI L-spine to evaluate left foot drop.  If that is negative I think this is likely a neurologic issue which will not require any type of orthopedic intervention.  I will see him back after that study.  No intervention required on the arm or shoulder or knee  at this time.  We talked about injections in these regions but he wants to hold off on that for now.  Follow-Up Instructions: Return for after MRI.   Orders:  Orders Placed This Encounter  Procedures  . XR KNEE 3 VIEW LEFT  . XR Shoulder Left  . XR Lumbar Spine 2-3 Views  . MR Lumbar Spine w/o contrast   No orders of the defined types were placed in this encounter.     Procedures: No procedures performed   Clinical Data: No additional findings.  Objective: Vital Signs: There were no vitals taken for this visit.  Physical Exam:   Constitutional: Patient appears well-developed HEENT:  Head: Normocephalic Eyes:EOM are normal Neck: Normal range of motion Cardiovascular: Normal rate Pulmonary/chest: Effort normal Neurologic: Patient is alert Skin: Skin is warm Psychiatric: Patient has normal mood and affect    Ortho Exam: Ortho exam demonstrates pretty reasonable passive range of motion of the left shoulder with 5 out of 5 rotator cuff strength testing infraspinatus supraspinatus and subscap.  No restriction of passive external rotation 15 degrees of abduction.  Cervical spine range of motion is intact.  Motor sensory function to the left hand is intact with palpable radial pulse.  Left knee is examined.  Mild varus alignment  is present.  No effusion.  Stable collateral cruciate ligaments with good patella tracking.  Pedal pulses palpable.  Does have 5 out of 5 ankle dorsiflexion plantarflexion quad hamstring strength with slight hip flexor weakness on the left at 5 out of 5 compared to 5+ out of 5 on the right.  No definite paresthesias L1-S1 bilaterally.  Specialty Comments:  No specialty comments available.  Imaging: No results found.   PMFS History: Patient Active Problem List   Diagnosis Date Noted  . Gait abnormality 11/03/2020  . Left-sided weakness 11/03/2020  . Chronic left shoulder pain 11/03/2020  . REM behavioral disorder 08/24/2020  . Coagulation  defect (HCC) 07/06/2020  . Paroxysmal atrial fibrillation (HCC) 01/06/2019  . OSA (obstructive sleep apnea) 09/24/2011  . Chest pain 05/11/2011  . Hypertension 05/11/2011  . Obesity 05/11/2011  . Hyperlipidemia 05/11/2011   Past Medical History:  Diagnosis Date  . Abnormal cardiac CT angiography 2006   only abnormal with soft plaque in LAD calcium score is0   . Abnormal LFTs   . Balance problem   . CAD (coronary artery disease)    a. has seen Dr. Riley Kill in the past and had cardiac CT 2006 with calcium score of 0. He had soft plaque in LAD of about 50%.  He had a normal stress echo in 2012. He also had a normal nuc in 2016.  Marland Kitchen Chest discomfort   . Diabetes mellitus   . Forgetfulness   . Hyperlipidemia   . Hypertension   . Obesity   . OSA (obstructive sleep apnea)   . Paroxysmal atrial fibrillation (HCC)   . Tremor     Family History  Problem Relation Age of Onset  . Hypertension Mother   . Hypertension Father   . Stroke Father   . Sudden death Sister   . Hypertension Sister   . Alcohol abuse Brother   . Heart attack Neg Hx     Past Surgical History:  Procedure Laterality Date  . ROTATOR CUFF REPAIR  2009   Right  . TRIGGER FINGER RELEASE  02/12/2012   Procedure: MINOR RELEASE TRIGGER FINGER/A-1 PULLEY;  Surgeon: Wyn Forster., MD;  Location: [facility];  Service: Orthopedics;  Laterality: Right;  right thumb   Social History   Occupational History  . Occupation: Retired     Associate Professor: [employer]  Tobacco Use  . Smoking status: Never Smoker  . Smokeless tobacco: Never Used  Substance and Sexual Activity  . Alcohol use: No  . Drug use: No  . Sexual activity: Not on file

## 2020-12-20 ENCOUNTER — Other Ambulatory Visit: Payer: Self-pay

## 2020-12-20 ENCOUNTER — Encounter: Payer: Self-pay | Admitting: Physical Therapy

## 2020-12-20 ENCOUNTER — Ambulatory Visit: Payer: 59 | Admitting: Physical Therapy

## 2020-12-20 DIAGNOSIS — M6281 Muscle weakness (generalized): Secondary | ICD-10-CM

## 2020-12-20 DIAGNOSIS — R262 Difficulty in walking, not elsewhere classified: Secondary | ICD-10-CM

## 2020-12-20 NOTE — Therapy (Signed)
[place]. [place], [place], [address] Phone: [phone]   Fax:  [phone]  Physical Therapy Treatment Progress Note Reporting Period 11/13/2020 to 12/20/2020  See note below for Objective Data and Assessment of Progress/Goals.      Patient Details  Name: Edwin Gallagher MRN: [id number] Date of Birth: [birth year] Referring Provider (PT): Fidel Levy Date: 12/20/2020   PT End of Session - 12/20/20 1354    Visit Number 10    Date for PT Re-Evaluation 01/11/21    PT Start Time 1318    PT Stop Time 1358    PT Time Calculation (min) 40 min    Activity Tolerance Patient tolerated treatment well    Behavior During Therapy [facility] for tasks assessed/performed           Past Medical History:  Diagnosis Date  . Abnormal cardiac CT angiography 2006   only abnormal with soft plaque in LAD calcium score is0   . Abnormal LFTs   . Balance problem   . CAD (coronary artery disease)    a. has seen Dr. Lia Foyer in the past and had cardiac CT 2006 with calcium score of 0. He had soft plaque in LAD of about 50%.  He had a normal stress echo in 2012. He also had a normal nuc in 2016.  Marland Kitchen Chest discomfort   . Diabetes mellitus   . Forgetfulness   . Hyperlipidemia   . Hypertension   . Obesity   . OSA (obstructive sleep apnea)   . Paroxysmal atrial fibrillation (HCC)   . Tremor     Past Surgical History:  Procedure Laterality Date  . ROTATOR CUFF REPAIR  2009   Right  . TRIGGER FINGER RELEASE  02/12/2012   Procedure: MINOR RELEASE TRIGGER FINGER/A-1 PULLEY;  Surgeon: Cammie Sickle., MD;  Location: [facility];  Service: Orthopedics;  Laterality: Right;  right thumb    There were no vitals filed for this visit.   Subjective Assessment - 12/20/20 1321    Subjective Pt reports feeling good today    Currently in Pain? No/denies                             [facility] Adult PT  Treatment/Exercise - 12/20/20 0001      Knee/Hip Exercises: Aerobic   Nustep L5 x 6 min      Knee/Hip Exercises: Machines for Strengthening   Cybex Knee Extension 5lb 2x10    Cybex Knee Flexion 25# BLE 1x10; 15# LLE 2x10      Knee/Hip Exercises: Standing   Lateral Step Up Both;1 set;5 reps;Step Height: 6";Hand Hold: 0    Forward Step Up Both;1 set;10 reps;Step Height: 6";Hand Hold: 0    Walking with Sports Cord 30# x4 each direction    Other Standing Knee Exercises 6" alt step taps no HR      Knee/Hip Exercises: Seated   Sit to Sand 2 sets;10 reps;without UE support      Shoulder Exercises: ROM/Strengthening   UBE (Upper Arm Bike) L1.5, 3 min each                    PT Short Term Goals - 11/22/20 1226      PT SHORT TERM GOAL #1   Title Pt will be I with initial HEP    Status Achieved  PT Long Term Goals - 12/20/20 1332      PT LONG TERM GOAL #1   Title understand fall risks in the home    Status Achieved      PT LONG TERM GOAL #2   Title increase Berg balance score to 53/56    Status Achieved      PT LONG TERM GOAL #3   Title increase left LE strength to 5/5    Status Partially Met      PT LONG TERM GOAL #4   Title walk without difficulty and without toe drag    Status Partially Met                 Plan - 12/20/20 1358    Clinical Impression Statement Pt demos some progress toward LTG. Balance has improved and demos increased LLE strength. Demos functional weakness with gait and still has difficulty with L foot drop with gait. Pt reports he feels a lack progress; discussed importance of completing HEP with pt VU. Will plan to continue at 1x/week for the next few weeks really focusing on functional LLE strengthening and gait.    PT Treatment/Interventions ADLs/Self Care Home Management;Gait training;Stair training;Functional mobility training;Therapeutic activities;Therapeutic exercise;Balance training;Neuromuscular  re-education;Manual techniques;Patient/family education    PT Next Visit Plan gym activities addressing LE and core strength, progress balance activities as tolerated    Consulted and Agree with Plan of Care Patient           Patient will benefit from skilled therapeutic intervention in order to improve the following deficits and impairments:  Abnormal gait,Decreased range of motion,Difficulty walking,Decreased activity tolerance,Cardiopulmonary status limiting activity,Decreased balance,Impaired flexibility,Decreased strength,Pain,Decreased mobility  Visit Diagnosis: Muscle weakness (generalized)  Difficulty in walking, not elsewhere classified     Problem List Patient Active Problem List   Diagnosis Date Noted  . Gait abnormality 11/03/2020  . Left-sided weakness 11/03/2020  . Chronic left shoulder pain 11/03/2020  . REM behavioral disorder 08/24/2020  . Coagulation defect ([place]) 07/06/2020  . Paroxysmal atrial fibrillation ([place]) 01/06/2019  . OSA (obstructive sleep apnea) 09/24/2011  . Chest pain 05/11/2011  . Hypertension 05/11/2011  . Obesity 05/11/2011  . Hyperlipidemia 05/11/2011   Amador Cunas, PT, DPT Donald Prose  12/20/2020, 2:00 PM  [place]. [place], [place], [address] Phone: [phone]   Fax:  [phone]  Name: Deric Bocock MRN: [id number] Date of Birth: 03/12/1956

## 2020-12-26 NOTE — Progress Notes (Signed)
HPI male never smoker followed for OSA, REM Behavior Disorder, complicated by obesity, DM 2, HBP, PAFib, Hyperlipidemia, NPSG 2012 AHI 85/ hr, CPAP titrated to 14 HST-09/24/17-AHI 20.9/hour, desaturation to 84%, body weight 289 pounds  --------------------------------------   08/24/20- 65 year old male never smoker followed for OSA, REM Behavior Disorder, complicated by obesity, DM 2, HTN, PAFib/ Eliquis, Hyperlipidemia,  CPAP titration study 07/14/20- 14 cwp, + RBS with screaming, raising arms during REM CPAP auto 5-12/ [employer]- compliance 83%, AHI 1.6/ hr Body weight- 269 lbs Covid vax- 2 Phizer Flu vax- had PCP Dr Thornell Mule has referred to [facility] for RBD and memory concerns. Control on current settings is good, but titration study suggested he might do better with a litle higher pressure, so we will try. Goal is to address any sleep fragmentation due to OSA.  I explained RBD as well as I could for him. He denies any family hx of Parkinsons or dementia. He was unsure if he had any personal memory issue now.  12/27/20- 65 year old male never smoker followed for OSA, REM Behavior Disorder, complicated by obesity, DM 2, HTN, PAFib/ Eliquis, Hyperlipidemia,  CPAP titration study 07/14/20- 14 cwp, + RBS with screaming, raising arms during REM CPAP auto 5-14/ [employer]- compliance 100%, AHI 1.2/ hr Body weight today-269 lbs Covid vax-3 Phizer Flu vax-had Melatonin for sleep. Less sleep disruption with CPAP and fewer episodes of RBD- sitting/ screaming.  I commented on resting tremor L hand. He has f/u appointment with Neurology/ Dr Terrace Arabia. MRI noted  Small vessel disease. He is seeing PT for weakness L leg.   ROS-see HPI   + = positive Constitutional:    weight loss, night sweats, fevers, chills, fatigue, lassitude. HEENT:    headaches, difficulty swallowing, tooth/dental problems, sore throat,       sneezing, itching, ear ache, nasal congestion, post nasal drip, snoring CV:    chest pain,  orthopnea, PND, swelling in lower extremities, anasarca,                                                          dizziness, palpitations Resp:   shortness of breath with exertion or at rest.                productive cough,   non-productive cough, coughing up of blood.              change in color of mucus.  wheezing.    Skin:    rash or lesions. GI:  No-   heartburn, indigestion, abdominal pain, nausea, vomiting, diarrhea,                 change in bowel habits, loss of appetite GU: dysuria, change in color of urine, + nocturia.   flank pain. MS:   joint pain, stiffness, decreased range of motion,+ back pain. Neuro-     + HPI Psych:  change in mood or affect.  depression or anxiety.   memory loss.  OBJ- Physical Exam General- Alert, Oriented, Affect-appropriate, Distress- none acute, + Obese Skin- rash-none, lesions- none, excoriation- none Lymphadenopathy- none Head- atraumatic            Eyes- Gross vision intact, PERRLA, conjunctivae and secretions clear            Ears- Hearing, canals-normal  Nose- Clear, no-Septal dev, mucus, polyps, erosion, perforation             Throat- Mallampati III , mucosa clear , drainage- none, tonsils- atrophic Neck- flexible , trachea midline, no stridor , thyroid nl, carotid no bruit Chest - symmetrical excursion , unlabored           Heart/CV- RRR at this visit , no murmur , no gallop  , no rub, nl s1 s2                           - JVD- none , edema- none, stasis changes- none, varices- none           Lung- clear to P&A, wheeze- none, cough- none , dullness-none, rub- none           Chest wall-  Abd-  Br/ Gen/ Rectal- Not done, not indicated Extrem- cyanosis- none, clubbing, none, atrophy- none, strength- nl Neuro-  + resting tremor L hand, speech a little slow

## 2020-12-27 ENCOUNTER — Other Ambulatory Visit: Payer: Self-pay

## 2020-12-27 ENCOUNTER — Ambulatory Visit (INDEPENDENT_AMBULATORY_CARE_PROVIDER_SITE_OTHER): Payer: 59 | Admitting: Internal Medicine

## 2020-12-27 ENCOUNTER — Encounter: Payer: Self-pay | Admitting: Internal Medicine

## 2020-12-27 DIAGNOSIS — G4752 REM sleep behavior disorder: Secondary | ICD-10-CM | POA: Diagnosis not present

## 2020-12-27 DIAGNOSIS — G4733 Obstructive sleep apnea (adult) (pediatric): Secondary | ICD-10-CM

## 2020-12-27 NOTE — Assessment & Plan Note (Signed)
Association of REM Behavior Disorder with resting tremor would be consistent with progression toward Parkinson's.  Plan- he will follow with Dr Terrace Arabia Neurology

## 2020-12-27 NOTE — Assessment & Plan Note (Signed)
Benefits from CPAP with good compliance and conytrol Plan- continue auto 5-14

## 2020-12-27 NOTE — Patient Instructions (Signed)
We can continue CPAP auto 5-14  Please keep your follow-up appointment with your Neurologist as planned.  Please call us if we can help

## 2020-12-29 ENCOUNTER — Encounter: Payer: Self-pay | Admitting: Physical Therapy

## 2020-12-29 ENCOUNTER — Ambulatory Visit: Payer: 59 | Admitting: Physical Therapy

## 2020-12-29 ENCOUNTER — Other Ambulatory Visit: Payer: Self-pay

## 2020-12-29 DIAGNOSIS — M6281 Muscle weakness (generalized): Secondary | ICD-10-CM

## 2020-12-29 DIAGNOSIS — R262 Difficulty in walking, not elsewhere classified: Secondary | ICD-10-CM

## 2020-12-29 NOTE — Therapy (Signed)
[place]. [place], [place], [address] Phone: [phone]   Fax:  [phone]  Physical Therapy Treatment  Patient Details  Name: Edwin Gallagher MRN: [id number] Date of Birth: 1955/11/20 Referring Provider (PT): Fidel Levy Date: 12/29/2020   PT End of Session - 12/29/20 1141    Visit Number 11    Date for PT Re-Evaluation 01/11/21    PT Start Time 1100    PT Stop Time 1142    PT Time Calculation (min) 42 min    Activity Tolerance Patient tolerated treatment well    Behavior During Therapy [facility] for tasks assessed/performed           Past Medical History:  Diagnosis Date  . Abnormal cardiac CT angiography 2006   only abnormal with soft plaque in LAD calcium score is0   . Abnormal LFTs   . Balance problem   . CAD (coronary artery disease)    a. has seen Dr. Lia Foyer in the past and had cardiac CT 2006 with calcium score of 0. He had soft plaque in LAD of about 50%.  He had a normal stress echo in 2012. He also had a normal nuc in 2016.  Marland Kitchen Chest discomfort   . Diabetes mellitus   . Forgetfulness   . Hyperlipidemia   . Hypertension   . Obesity   . OSA (obstructive sleep apnea)   . Paroxysmal atrial fibrillation (HCC)   . Tremor     Past Surgical History:  Procedure Laterality Date  . ROTATOR CUFF REPAIR  2009   Right  . TRIGGER FINGER RELEASE  02/12/2012   Procedure: MINOR RELEASE TRIGGER FINGER/A-1 PULLEY;  Surgeon: Cammie Sickle., MD;  Location: [facility];  Service: Orthopedics;  Laterality: Right;  right thumb    There were no vitals filed for this visit.   Subjective Assessment - 12/29/20 1101    Subjective feeling good, no problems    Currently in Pain? No/denies                             [facility] Adult PT Treatment/Exercise - 12/29/20 0001      Neuro Re-ed    Neuro Re-ed Details  Sit to stand LE on airex with ball toss 3x10      Knee/Hip Exercises:  Aerobic   Recumbent Bike L 1.7 x 79mn      Knee/Hip Exercises: Machines for Strengthening   Cybex Knee Extension 10lb x10, LLE 5lb 2x10    Cybex Knee Flexion 35# BLE 1x10; 15# LLE 2x10      Knee/Hip Exercises: Standing   Lateral Step Up Both;1 set;Step Height: 6";Hand Hold: 0;10 reps    Forward Step Up Both;1 set;10 reps;Step Height: 6";Hand Hold: 0    Walking with Sports Cord 40# x4 each direction                    PT Short Term Goals - 11/22/20 1226      PT SHORT TERM GOAL #1   Title Pt will be I with initial HEP    Status Achieved             PT Long Term Goals - 12/20/20 1332      PT LONG TERM GOAL #1   Title understand fall risks in the home    Status Achieved      PT LONG TERM  GOAL #2   Title increase Berg balance score to 53/56    Status Achieved      PT LONG TERM GOAL #3   Title increase left LE strength to 5/5    Status Partially Met      PT LONG TERM GOAL #4   Title walk without difficulty and without toe drag    Status Partially Met                 Plan - 12/29/20 1141    Clinical Impression Statement Pt did well with a progressed treatment session. Increase resistance tolerated with resisted gait. Some fatigue with back to back step up interventions but no LOB. No issues with the added resistance doing leg extension and curls. Good rotational control with ball tosses.    Stability/Clinical Decision Making Evolving/Moderate complexity    Rehab Potential Good    PT Frequency 2x / week    PT Duration 8 weeks    PT Treatment/Interventions ADLs/Self Care Home Management;Gait training;Stair training;Functional mobility training;Therapeutic activities;Therapeutic exercise;Balance training;Neuromuscular re-education;Manual techniques;Patient/family education    PT Next Visit Plan gym activities addressing LE and core strength, progress balance activities as tolerated           Patient will benefit from skilled therapeutic intervention in  order to improve the following deficits and impairments:  Abnormal gait,Decreased range of motion,Difficulty walking,Decreased activity tolerance,Cardiopulmonary status limiting activity,Decreased balance,Impaired flexibility,Decreased strength,Pain,Decreased mobility  Visit Diagnosis: Difficulty in walking, not elsewhere classified  Muscle weakness (generalized)     Problem List Patient Active Problem List   Diagnosis Date Noted  . Gait abnormality 11/03/2020  . Left-sided weakness 11/03/2020  . Chronic left shoulder pain 11/03/2020  . REM behavioral disorder 08/24/2020  . Coagulation defect ([place]) 07/06/2020  . Paroxysmal atrial fibrillation ([place]) 01/06/2019  . OSA (obstructive sleep apnea) 09/24/2011  . Chest pain 05/11/2011  . Hypertension 05/11/2011  . Obesity 05/11/2011  . Hyperlipidemia 05/11/2011    Scot Jun, PTA 12/29/2020, 11:43 AM  [place]. [place], [place], [address] Phone: [phone]   Fax:  [phone]  Name: Edwin Gallagher MRN: [id number] Date of Birth: Sep 21, 1956

## 2021-01-01 ENCOUNTER — Other Ambulatory Visit: Payer: Self-pay

## 2021-01-01 ENCOUNTER — Ambulatory Visit
Admission: RE | Admit: 2021-01-01 | Discharge: 2021-01-01 | Disposition: A | Discharge status: Home / Self Care | Payer: 59 | Source: Ambulatory Visit | Attending: Orthopedic Surgery | Admitting: Orthopedic Surgery

## 2021-01-01 DIAGNOSIS — M5410 Radiculopathy, site unspecified: Secondary | ICD-10-CM

## 2021-01-03 ENCOUNTER — Ambulatory Visit: Payer: 59 | Admitting: Physical Therapy

## 2021-01-03 ENCOUNTER — Other Ambulatory Visit: Payer: Self-pay

## 2021-01-03 DIAGNOSIS — R262 Difficulty in walking, not elsewhere classified: Secondary | ICD-10-CM

## 2021-01-03 DIAGNOSIS — M6281 Muscle weakness (generalized): Secondary | ICD-10-CM

## 2021-01-03 NOTE — Therapy (Signed)
[facility]. [place], [place], [address] Phone: [phone]   Fax:  [phone]  Physical Therapy Treatment  Patient Details  Name: Edwin Gallagher MRN: [id number] Date of Birth: [birth year] Referring Provider (PT): Fidel Levy Date: 01/03/2021   PT End of Session - 01/03/21 1314    Visit Number 12    Date for PT Re-Evaluation 01/11/21    PT Start Time 5361    PT Stop Time 1315    PT Time Calculation (min) 44 min           Past Medical History:  Diagnosis Date  . Abnormal cardiac CT angiography 2006   only abnormal with soft plaque in LAD calcium score is0   . Abnormal LFTs   . Balance problem   . CAD (coronary artery disease)    a. has seen Dr. Lia Foyer in the past and had cardiac CT 2006 with calcium score of 0. He had soft plaque in LAD of about 50%.  He had a normal stress echo in 2012. He also had a normal nuc in 2016.  Marland Kitchen Chest discomfort   . Diabetes mellitus   . Forgetfulness   . Hyperlipidemia   . Hypertension   . Obesity   . OSA (obstructive sleep apnea)   . Paroxysmal atrial fibrillation (HCC)   . Tremor     Past Surgical History:  Procedure Laterality Date  . ROTATOR CUFF REPAIR  2009   Right  . TRIGGER FINGER RELEASE  02/12/2012   Procedure: MINOR RELEASE TRIGGER FINGER/A-1 PULLEY;  Surgeon: Cammie Sickle., MD;  Location: [facility];  Service: Orthopedics;  Laterality: Right;  right thumb    There were no vitals filed for this visit.   Subjective Assessment - 01/03/21 1235    Subjective doing good today. amb in with limp on left and knee in flexion    Currently in Pain? No/denies                             [facility] Adult PT Treatment/Exercise - 01/03/21 0001      Knee/Hip Exercises: Aerobic   Nustep L5 x 6 min    Other Aerobic UBE L 3  83fd/3 back   pt requested this ex     Knee/Hip Exercises: Standing   Lateral Step Up Both;1 set;Step Height:  6";Hand Hold: 0;10 reps;Hand Hold: 1   opp leg abd   Forward Step Up Both;1 set;10 reps;Step Height: 6";Hand Hold: 0;Hand Hold: 1   opp leg ext   Functional Squat 2 sets;10 reps   with diag 15# pully flex and then pulley ext   Walking with Sports Cord 40# x4 each direction      Knee/Hip Exercises: Seated   Sit to Sand 2 sets;without UE support   8 x each with 1 leg on 6 inch step                   PT Short Term Goals - 11/22/20 1226      PT SHORT TERM GOAL #1   Title Pt will be I with initial HEP    Status Achieved             PT Long Term Goals - 12/20/20 1332      PT LONG TERM GOAL #1   Title understand fall risks in the home    Status Achieved  PT LONG TERM GOAL #2   Title increase Berg balance score to 53/56    Status Achieved      PT LONG TERM GOAL #3   Title increase left LE strength to 5/5    Status Partially Met      PT LONG TERM GOAL #4   Title walk without difficulty and without toe drag    Status Partially Met                 Plan - 01/03/21 1314    Clinical Impression Statement progressed step up with 1 hand opp leg mvmt, STS with 1 leg on 6 inch and squats with func diagnols- seaetd rest required and cuing needed    PT Treatment/Interventions ADLs/Self Care Home Management;Gait training;Stair training;Functional mobility training;Therapeutic activities;Therapeutic exercise;Balance training;Neuromuscular re-education;Manual techniques;Patient/family education    PT Next Visit Plan gym activities addressing LE and core strength, progress balance activities as tolerated           Patient will benefit from skilled therapeutic intervention in order to improve the following deficits and impairments:  Abnormal gait,Decreased range of motion,Difficulty walking,Decreased activity tolerance,Cardiopulmonary status limiting activity,Decreased balance,Impaired flexibility,Decreased strength,Pain,Decreased mobility  Visit Diagnosis: Muscle  weakness (generalized)  Difficulty in walking, not elsewhere classified     Problem List Patient Active Problem List   Diagnosis Date Noted  . Gait abnormality 11/03/2020  . Left-sided weakness 11/03/2020  . Chronic left shoulder pain 11/03/2020  . REM behavioral disorder 08/24/2020  . Coagulation defect ([place]) 07/06/2020  . Paroxysmal atrial fibrillation ([place]) 01/06/2019  . OSA (obstructive sleep apnea) 09/24/2011  . Chest pain 05/11/2011  . Hypertension 05/11/2011  . Obesity 05/11/2011  . Hyperlipidemia 05/11/2011    ,ANGIE PTA 01/03/2021, 1:16 PM  [facility] [place]. [place], [place], [address] Phone: [phone]   Fax:  [phone]  Name: Edwin Gallagher MRN: [id number] Date of Birth: 27-Apr-1956

## 2021-01-05 ENCOUNTER — Encounter: Payer: Self-pay | Admitting: Neurology

## 2021-01-05 ENCOUNTER — Ambulatory Visit (INDEPENDENT_AMBULATORY_CARE_PROVIDER_SITE_OTHER): Payer: 59 | Admitting: Neurology

## 2021-01-05 VITALS — BP 133/72 | HR 76 | Ht 72.0 in | Wt 265.0 lb

## 2021-01-05 DIAGNOSIS — R251 Tremor, unspecified: Secondary | ICD-10-CM

## 2021-01-05 DIAGNOSIS — R269 Unspecified abnormalities of gait and mobility: Secondary | ICD-10-CM | POA: Diagnosis not present

## 2021-01-05 MED ORDER — CARBIDOPA-LEVODOPA 25-100 MG PO TABS: 1.0000 | ORAL_TABLET | Freq: Three times a day (TID) | ORAL | 6 refills | Status: DC

## 2021-01-05 NOTE — Patient Instructions (Signed)
DaTscan is a drug that is injected into the bloodstream to assess dopamine containing neurons, which are involved in controlling movement. One of our subspecialized imaging physicians will then use a gamma camera to take pictures of your brain. By analyzing the images, our physician, in consultation with your physician, can help determine whether the symptoms you or your loved one are experiencing are the result of Parkinsonian syndrome. Parkinsonian syndromes occur when certain neurons of the brain undergo degeneration. The DaTscan study is primarily designed to differentiate Parkinsonian syndrome from a relatively benign condition called essential tremor. Essential tremor results in a noticeable tremor which may mimic the Parkinsonian Syndrome. DaTscan is FDA approved to differentiate between essential tremor and Parkinsonian syndrome.  Parkinson's disease is the most common form of Parkinsonian syndrome, but there are other forms, including multiple system atrophy and progressive supranuclear palsy. Although DaTscan cannot determine which form of Parkinsonian syndrome you or your loved one has, it can, with other scans and clinical evaluations, help your physician determine whether your are suffering from a Parkinsonian syndrome.    For patients with dementia, the DaTscan has been successfully used in [place] to separate Alzheimer's disease from a dementia called Lewy Body disease. This has important implications in determining which medications can be safely used to treat the dementia.

## 2021-01-05 NOTE — Progress Notes (Signed)
Chief Complaint  Patient presents with  . Follow-up    Follow up for mild parkinsonian features/gait abnormality. He would like to review his MRI brain in detail today.     HISTORICAL  Edwin Gallagher is a 65 year old male, seen in request by his primary care physician Dr. Charlane Ferretti for evaluation of balance concerns, sleep issues, forgetfulness, initial evaluation was on November 03, 2020  I reviewed and summarized the referring note. PMHX. HTN HLD DM Atrial fibrillation, on Eliquis,  He retired from [employer] in 2017, since then, he become less active, still drive his children to school, doing some yard work, over the past 4 years, he noticed decreased stamina, decreased energy, mild balance issues,  He used to work heavy labor at [employer], he denies family history of memory loss,  He was noted by his family that he moves a lot during sleep, talking out of dreams,  He also complains of left shoulder pain, he has to focus on lifting his left leg while ambulating, more noticeable walking longer distances, he also have left hand resting tremor  He denied loss of sense of smell, no constipations,  Laboratory evaluation in October 2021, normal CMP, potassium of 3.2, CBC, hemoglobin of 13.3, B12 294, LDL 108 TSH 1.28,  UPDATE Jan 05 2021: He is alone at today's visit, complains of worsening low back pain, increased gait abnormality, tends to track his left foot across the floor,  We personally reviewed MRI of the brain in January 2022, moderate supratentorium small vessel disease, no acute abnormality,  He was noted to have left more than right rigidity, bradykinesia,  We also personally reviewed MRI of lumbar spine Lumbar spine degeneration which is most notable at L4-5 where there is advanced facet osteoarthritis with anterolisthesis and moderate bilateral foraminal impingement. Subarticular recess narrowing on the left more than right at the same level.  REVIEW OF SYSTEMS: Full 14  system review of systems performed and notable only for as above All other review of systems were negative.  ALLERGIES: No Known Allergies  HOME MEDICATIONS: Current Outpatient Medications  Medication Sig Dispense Refill  . amLODipine (NORVASC) 10 MG tablet Take 10 mg by mouth daily.    Marland Kitchen apixaban (ELIQUIS) 5 MG TABS tablet Take 1 tablet (5 mg total) by mouth 2 (two) times daily. 60 tablet 3  . diltiazem (CARDIZEM) 30 MG tablet TAKE 1 TABLET EVERY 4 HOURS AS NEEDED FOR AFIB HEART RATE >100 45 tablet 1  . doxazosin (CARDURA) 4 MG tablet Take 4 mg by mouth 2 (two) times daily.    Marland Kitchen glimepiride (AMARYL) 4 MG tablet Take 4 mg by mouth 2 (two) times daily.    . hydrochlorothiazide (HYDRODIURIL) 25 MG tablet Take 25 mg by mouth daily.    . hydrocortisone 2.5 % cream Apply topically.    Marland Kitchen losartan (COZAAR) 100 MG tablet Take 100 mg by mouth daily.    Marland Kitchen losartan-hydrochlorothiazide (HYZAAR) 100-25 MG per tablet Take 1 tablet by mouth daily.    . metFORMIN (GLUCOPHAGE) 500 MG tablet Take 500 mg by mouth 2 (two) times daily with a meal.    . metoprolol succinate (TOPROL-XL) 100 MG 24 hr tablet Take 1 tablet (100 mg total) by mouth daily. 30 tablet 0  . ONETOUCH ULTRA test strip USE TO TEST BLOOD SUGAR TWICE DAILY    . simvastatin (ZOCOR) 20 MG tablet Take 20 mg by mouth daily.     . traMADol (ULTRAM) 50 MG tablet Take 50 mg  by mouth as needed.     No current facility-administered medications for this visit.    PAST MEDICAL HISTORY: Past Medical History:  Diagnosis Date  . Abnormal cardiac CT angiography 2006   only abnormal with soft plaque in LAD calcium score is0   . Abnormal LFTs   . Balance problem   . CAD (coronary artery disease)    a. has seen Dr. Riley Kill in the past and had cardiac CT 2006 with calcium score of 0. He had soft plaque in LAD of about 50%.  He had a normal stress echo in 2012. He also had a normal nuc in 2016.  Marland Kitchen Chest discomfort   . Diabetes mellitus   .  Forgetfulness   . Hyperlipidemia   . Hypertension   . Obesity   . OSA (obstructive sleep apnea)   . Paroxysmal atrial fibrillation (HCC)   . Tremor     PAST SURGICAL HISTORY: Past Surgical History:  Procedure Laterality Date  . ROTATOR CUFF REPAIR  2009   Right  . TRIGGER FINGER RELEASE  02/12/2012   Procedure: MINOR RELEASE TRIGGER FINGER/A-1 PULLEY;  Surgeon: Wyn Forster., MD;  Location: [facility];  Service: Orthopedics;  Laterality: Right;  right thumb    FAMILY HISTORY: Family History  Problem Relation Age of Onset  . Hypertension Mother   . Hypertension Father   . Stroke Father   . Sudden death Sister   . Hypertension Sister   . Alcohol abuse Brother   . Heart attack Neg Hx     SOCIAL HISTORY: Social History   Socioeconomic History  . Marital status: Married    Spouse name: Not on file  . Number of children: 9  . Years of education: two years of college  . Highest education level: Not on file  Occupational History  . Occupation: Retired     Associate Professor: [employer]  Tobacco Use  . Smoking status: Never Smoker  . Smokeless tobacco: Never Used  Vaping Use  . Vaping Use: Never used  Substance and Sexual Activity  . Alcohol use: No  . Drug use: No  . Sexual activity: Not on file  Other Topics Concern  . Not on file  Social History Narrative   Lives at home with his wife.   Ambidextrous.   No daily use of caffeine, occasional soda.   Social Determinants of Health   Financial Resource Strain: Not on file  Food Insecurity: Not on file  Transportation Needs: Not on file  Physical Activity: Not on file  Stress: Not on file  Social Connections: Not on file  Intimate Partner Violence: Not on file     PHYSICAL EXAM   Vitals:   01/05/21 1133  BP: 133/72  Pulse: 76  Weight: 265 lb (120.2 kg)  Height: 6' (1.829 m)   Not recorded     Body mass index is 35.94 kg/m.  PHYSICAL EXAMNIATION:  Gen: NAD, conversant, well nourised, well  groomed                     Cardiovascular: Regular rate rhythm, no peripheral edema, warm, nontender. Eyes: Conjunctivae clear without exudates or hemorrhage Neck: Supple, no carotid bruits. Pulmonary: Clear to auscultation bilaterally   NEUROLOGICAL EXAM:  MENTAL STATUS: MMSE - Mini Mental State Exam 11/03/2020  Orientation to time 5  Orientation to Place 5  Registration 3  Attention/ Calculation 5  Recall 3  Language- name 2 objects 2  Language-  repeat 1  Language- follow 3 step command 3  Language- read & follow direction 1  Write a sentence 1  Copy design 1  Total score 30  animal 13  CRANIAL NERVES: CN II: Visual fields are full to confrontation. Pupils are round equal and briskly reactive to light. CN III, IV, VI: extraocular movement are normal. No ptosis. CN V: Facial sensation is intact to light touch CN VII: Face is symmetric with normal eye closure  CN VIII: Hearing is normal to causal conversation. CN IX, X: Phonation is normal. CN XI: Head turning and shoulder shrug are intact  MOTOR: Limited range of left shoulder, due to left shoulder pain, there was no significant bilateral upper or lower extremity weakness, left more than right mild bradykinesia, rigidity,  REFLEXES: Reflexes are 2+ and symmetric at the biceps, triceps, knees, and ankles. Plantar responses are flexor.  SENSORY: Intact to light touch, pinprick and vibratory sensation are intact in fingers and toes.  COORDINATION: There is no trunk or limb dysmetria noted.  GAIT/STANCE: Need to push up to get up from seated position, decreased swing of left arm, moderate stride, tends to drag left foot across the floor, with tendency for left foot ankle plantarflexion, Romberg is absent.   DIAGNOSTIC DATA (LABS, IMAGING, TESTING) - I reviewed patient records, labs, notes, testing and imaging myself where available.   ASSESSMENT AND PLAN  Edwin Gallagher is a 65 y.o. male   Gait abnormality Chronic  low back pain  He has mild parkinsonian features on examination, involving left upper and lower extremity more than right, also complains of REM sleep disorder,  MRI of the brain showed moderate supratentorium small vessel disease,   DaTSCAN  Empirically trial of Sinemet 25/100 mg 3 times a day  EMG nerve conduction study to rule out left lumbar radiculopathy   Levert Feinstein, M.D. Ph.D.  [facility] [address] [place], [place] [address] Ph: [phone] Fax: [phone]  CC:  Charlane Ferretti, DO [address] [place],  [place] [address]

## 2021-01-10 ENCOUNTER — Ambulatory Visit: Payer: 59 | Admitting: Physical Therapy

## 2021-01-11 ENCOUNTER — Ambulatory Visit (INDEPENDENT_AMBULATORY_CARE_PROVIDER_SITE_OTHER): Payer: 59 | Admitting: Orthopedic Surgery

## 2021-01-11 DIAGNOSIS — M5410 Radiculopathy, site unspecified: Secondary | ICD-10-CM | POA: Diagnosis not present

## 2021-01-12 ENCOUNTER — Ambulatory Visit: Payer: 59 | Attending: Neurology | Admitting: Physical Therapy

## 2021-01-12 ENCOUNTER — Encounter: Payer: Self-pay | Admitting: Orthopedic Surgery

## 2021-01-12 ENCOUNTER — Other Ambulatory Visit: Payer: Self-pay

## 2021-01-12 ENCOUNTER — Encounter: Payer: Self-pay | Admitting: Physical Therapy

## 2021-01-12 DIAGNOSIS — M6281 Muscle weakness (generalized): Secondary | ICD-10-CM

## 2021-01-12 DIAGNOSIS — R262 Difficulty in walking, not elsewhere classified: Secondary | ICD-10-CM | POA: Diagnosis not present

## 2021-01-12 NOTE — Therapy (Signed)
[facility]. [place], [place], [address] Phone: [phone]   Fax:  [phone]  Physical Therapy Treatment  Patient Details  Name: Edwin Gallagher MRN: [id number] Date of Birth: 03/29/1956 Referring Provider (PT): Fidel Levy Date: 01/12/2021   PT End of Session - 01/12/21 1342    Visit Number 13    Date for PT Re-Evaluation 01/11/21    PT Start Time 1300    PT Stop Time 1345    PT Time Calculation (min) 45 min    Activity Tolerance Patient tolerated treatment well    Behavior During Therapy [facility] for tasks assessed/performed           Past Medical History:  Diagnosis Date  . Abnormal cardiac CT angiography 2006   only abnormal with soft plaque in LAD calcium score is0   . Abnormal LFTs   . Balance problem   . CAD (coronary artery disease)    a. has seen Dr. Lia Foyer in the past and had cardiac CT 2006 with calcium score of 0. He had soft plaque in LAD of about 50%.  He had a normal stress echo in 2012. He also had a normal nuc in 2016.  Marland Kitchen Chest discomfort   . Diabetes mellitus   . Forgetfulness   . Hyperlipidemia   . Hypertension   . Obesity   . OSA (obstructive sleep apnea)   . Paroxysmal atrial fibrillation (HCC)   . Tremor     Past Surgical History:  Procedure Laterality Date  . ROTATOR CUFF REPAIR  2009   Right  . TRIGGER FINGER RELEASE  02/12/2012   Procedure: MINOR RELEASE TRIGGER FINGER/A-1 PULLEY;  Surgeon: Cammie Sickle., MD;  Location: [facility];  Service: Orthopedics;  Laterality: Right;  right thumb    There were no vitals filed for this visit.   Subjective Assessment - 01/12/21 1305    Subjective "Feeling fine"    Currently in Pain? No/denies              [facility] PT Assessment - 01/12/21 0001      Strength   Left Hip Flexion 4/5    Left Hip ABduction 5/5    Left Hip ADduction 4/5    Left Knee Flexion 4/5    Left Knee Extension 4+/5                          [facility] Adult PT Treatment/Exercise - 01/12/21 0001      Knee/Hip Exercises: Aerobic   Nustep L5 x 6 min    Other Aerobic UBE L 3  52fd/3 back      Knee/Hip Exercises: Machines for Strengthening   Cybex Knee Extension LLE 5lb 2x10    Cybex Knee Flexion 35# BLE 1x10; 15# LLE 2x10      Knee/Hip Exercises: Standing   Forward Step Up Both;1 set;10 reps;Hand Hold: 0;Step Height: 6"    Other Standing Knee Exercises 6" alt step taps no HR      Knee/Hip Exercises: Seated   Sit to Sand 2 sets;10 reps;without UE support   holding yellow ball                   PT Short Term Goals - 11/22/20 1226      PT SHORT TERM GOAL #1   Title Pt will be I with initial HEP    Status Achieved  PT Long Term Goals - 01/12/21 1321      PT LONG TERM GOAL #3   Title increase left LE strength to 5/5    Status Partially Met      PT LONG TERM GOAL #4   Title walk without difficulty and without toe drag    Status Achieved   Have to think about it                Plan - 01/12/21 1343    Clinical Impression Statement Pt has progressed meeting all balance LTGs. LLE strength has improved overall but some weakness remains in the HS. He reports that he can do most things at home. All interventions completed well but pt does has some fatigue.    Stability/Clinical Decision Making Evolving/Moderate complexity    Rehab Potential Good    PT Frequency 2x / week    PT Duration 8 weeks    PT Treatment/Interventions ADLs/Self Care Home Management;Gait training;Stair training;Functional mobility training;Therapeutic activities;Therapeutic exercise;Balance training;Neuromuscular re-education;Manual techniques;Patient/family education    PT Next Visit Plan D/C PT           Patient will benefit from skilled therapeutic intervention in order to improve the following deficits and impairments:  Abnormal gait,Decreased range of motion,Difficulty  walking,Decreased activity tolerance,Cardiopulmonary status limiting activity,Decreased balance,Impaired flexibility,Decreased strength,Pain,Decreased mobility  Visit Diagnosis: Difficulty in walking, not elsewhere classified  Muscle weakness (generalized)     Problem List Patient Active Problem List   Diagnosis Date Noted  . Tremor 01/05/2021  . Gait abnormality 11/03/2020  . Left-sided weakness 11/03/2020  . Chronic left shoulder pain 11/03/2020  . REM behavioral disorder 08/24/2020  . Coagulation defect ([place]) 07/06/2020  . Paroxysmal atrial fibrillation ([place]) 01/06/2019  . OSA (obstructive sleep apnea) 09/24/2011  . Chest pain 05/11/2011  . Hypertension 05/11/2011  . Obesity 05/11/2011  . Hyperlipidemia 05/11/2011   PHYSICAL THERAPY DISCHARGE SUMMARY  Visits from Start of Care: 13 Plan: Patient agrees to discharge.  Patient goals were not met. Patient is being discharged due to being pleased with the current functional level.  ?????       Scot Jun, PTA 01/12/2021, 1:45 PM  [facility]. [place], [place], [address] Phone: [phone]   Fax:  [phone]  Name: Edwin Gallagher MRN: [id number] Date of Birth: 1956/09/01

## 2021-01-12 NOTE — Progress Notes (Addendum)
Office Visit Note   Patient: Edwin Gallagher           Date of Birth: 1956-03-16           MRN: [id number] Visit Date: 01/11/2021 Requested by: Charlane Ferretti, DO [address] [place],  [place] [address] PCP: Charlane Ferretti, DO  Subjective: Chief Complaint  Patient presents with  . Other     Scan review    HPI: Edwin Gallagher is a 65 y.o. male who presents to the office complaining of left leg pain and altered gait.  Patient notes continued dropfoot gait with his left foot dragging when walking at times.  He states he has to concentrate specifically when he is walking to ensure that his foot does not drag or he does not trip.  He also notes some radicular pain down the lateral thigh with associated numbness and tingling.  He is here to review MRI scan of the lumbar spine..                ROS: All systems reviewed are negative as they relate to the chief complaint within the history of present illness.  Patient denies fevers or chills.  Assessment & Plan: Visit Diagnoses:  1. Radicular leg pain     Plan: Patient is a 65 year old male who presents for review of MRI scan.  He has been complaining of lateral thigh radicular pain and some numbness and tingling in the left leg.  He has a subtle dropfoot gait but no overt dorsiflexion weakness of the left leg on exam.  MRI of the lumbar spine was reviewed today and it showed L-spine degeneration most notable at L4-L5 with facet arthritis and moderate bilateral foraminal impingement with subarticular recess narrowing left greater than right.  Plan to refer patient to Dr. Naaman Plummer for evaluation and potential lumbar spine ESI based on his evaluation.  Patient agreed with this plan.  Follow-Up Instructions: No follow-ups on file.   Orders:  Orders Placed This Encounter  Procedures  . Ambulatory referral to Physical Medicine Rehab   No orders of the defined types were placed in this encounter.     Procedures: No procedures  performed   Clinical Data: No additional findings.  Objective: Vital Signs: There were no vitals taken for this visit.  Physical Exam:  Constitutional: Patient appears well-developed HEENT:  Head: Normocephalic Eyes:EOM are normal Neck: Normal range of motion Cardiovascular: Normal rate Pulmonary/chest: Effort normal Neurologic: Patient is alert Skin: Skin is warm Psychiatric: Patient has normal mood and affect  Ortho Exam: Ortho exam demonstrates 5/5 motor strength of bilateral hip flexion, quadricep, hamstring, dorsiflexion, plantarflexion.  There is no overt weakness of dorsiflexion on the left side when specifically tested but patient does have a subtle dropfoot gait where his left foot does not dorsiflex consistently compared with his right foot when he is ambulatory.  No lateral sided knee masses or tenderness to palpation around the fibular head is present.  He does have very slight foot drop but this is not complete.  This is more of a weakness as opposed to complete loss of function of the anterior tib and anterior compartment musculature.  Specialty Comments:  No specialty comments available.  Imaging: No results found.   PMFS History: Patient Active Problem List   Diagnosis Date Noted  . Tremor 01/05/2021  . Gait abnormality 11/03/2020  . Left-sided weakness 11/03/2020  . Chronic left shoulder pain 11/03/2020  . REM behavioral disorder 08/24/2020  . Coagulation defect (  HCC) 07/06/2020  . Paroxysmal atrial fibrillation (HCC) 01/06/2019  . OSA (obstructive sleep apnea) 09/24/2011  . Chest pain 05/11/2011  . Hypertension 05/11/2011  . Obesity 05/11/2011  . Hyperlipidemia 05/11/2011   Past Medical History:  Diagnosis Date  . Abnormal cardiac CT angiography 2006   only abnormal with soft plaque in LAD calcium score is0   . Abnormal LFTs   . Balance problem   . CAD (coronary artery disease)    a. has seen Dr. Riley Kill in the past and had cardiac CT 2006 with  calcium score of 0. He had soft plaque in LAD of about 50%.  He had a normal stress echo in 2012. He also had a normal nuc in 2016.  Marland Kitchen Chest discomfort   . Diabetes mellitus   . Forgetfulness   . Hyperlipidemia   . Hypertension   . Obesity   . OSA (obstructive sleep apnea)   . Paroxysmal atrial fibrillation (HCC)   . Tremor     Family History  Problem Relation Age of Onset  . Hypertension Mother   . Hypertension Father   . Stroke Father   . Sudden death Sister   . Hypertension Sister   . Alcohol abuse Brother   . Heart attack Neg Hx     Past Surgical History:  Procedure Laterality Date  . ROTATOR CUFF REPAIR  2009   Right  . TRIGGER FINGER RELEASE  02/12/2012   Procedure: MINOR RELEASE TRIGGER FINGER/A-1 PULLEY;  Surgeon: Wyn Forster., MD;  Location: [facility];  Service: Orthopedics;  Laterality: Right;  right thumb   Social History   Occupational History  . Occupation: Retired     Associate Professor: [employer]  Tobacco Use  . Smoking status: Never Smoker  . Smokeless tobacco: Never Used  Vaping Use  . Vaping Use: Never used  Substance and Sexual Activity  . Alcohol use: No  . Drug use: No  . Sexual activity: Not on file

## 2021-01-23 ENCOUNTER — Ambulatory Visit (INDEPENDENT_AMBULATORY_CARE_PROVIDER_SITE_OTHER): Payer: 59 | Admitting: Neurology

## 2021-01-23 ENCOUNTER — Telehealth: Payer: Self-pay | Admitting: Neurology

## 2021-01-23 DIAGNOSIS — R269 Unspecified abnormalities of gait and mobility: Secondary | ICD-10-CM | POA: Diagnosis not present

## 2021-01-23 DIAGNOSIS — M545 Low back pain, unspecified: Secondary | ICD-10-CM | POA: Diagnosis not present

## 2021-01-23 DIAGNOSIS — R251 Tremor, unspecified: Secondary | ICD-10-CM

## 2021-01-23 NOTE — Telephone Encounter (Signed)
I ordered DatScan at his last visit on Jan 05 2021, please make sure that he is on schedule for that.

## 2021-01-23 NOTE — Progress Notes (Signed)
HISTORICAL  Edwin Gallagher is a 65 year old male, seen in request by his primary care physician Dr. Charlane Ferretti for evaluation of balance concerns, sleep issues, forgetfulness, initial evaluation was on November 03, 2020  I reviewed and summarized the referring note. PMHX. HTN HLD DM Atrial fibrillation, on Eliquis,  He retired from [employer] in 2017, since then, he become less active, still drive his children to school, doing some yard work, over the past 4 years, he noticed decreased stamina, decreased energy, mild balance issues,  He used to work heavy labor at [employer], he denies family history of memory loss,  He was noted by his family that he moves a lot during sleep, talking out of dreams,  He also complains of left shoulder pain, he has to focus on lifting his left leg while ambulating, more noticeable walking longer distances, he also have left hand resting tremor  He denied loss of sense of smell, no constipations,  Laboratory evaluation in October 2021, normal CMP, potassium of 3.2, CBC, hemoglobin of 13.3, B12 294, LDL 108 TSH 1.28,  UPDATE Jan 05 2021: He is alone at today's visit, complains of worsening low back pain, increased gait abnormality, tends to track his left foot across the floor,  We personally reviewed MRI of the brain in January 2022, moderate supratentorium small vessel disease, no acute abnormality,  He was noted to have left more than right rigidity, bradykinesia,  We also personally reviewed MRI of lumbar spine Lumbar spine degeneration which is most notable at L4-5 where there is advanced facet osteoarthritis with anterolisthesis and moderate bilateral foraminal impingement. Subarticular recess narrowing on the left more than right at the same level.  Update January 23, 2021: He return for electrodiagnostic study today, which showed no significant abnormality, in specific, no evidence of large fiber peripheral neuropathy, or bilateral lumbosacral  radiculopathy  He continued complaints of low back pain, gait abnormality  We personally reviewed MRI of lumbar spine on January 01, 2021, multilevel degenerative changes, most obvious at L4-5, advanced facet arthropathy, moderate bilateral foraminal stenosis, no significant canal stenosis  He was noted to have mild parkinsonian features, DaTSCAN is pending, slight improvement with Sinemet/25/100 mg 3 times daily  REVIEW OF SYSTEMS: Full 14 system review of systems performed and notable only for as above All other review of systems were negative.  ALLERGIES: No Known Allergies  HOME MEDICATIONS: Current Outpatient Medications  Medication Sig Dispense Refill  . amLODipine (NORVASC) 10 MG tablet Take 10 mg by mouth daily.    Marland Kitchen apixaban (ELIQUIS) 5 MG TABS tablet Take 1 tablet (5 mg total) by mouth 2 (two) times daily. 60 tablet 3  . carbidopa-levodopa (SINEMET IR) 25-100 MG tablet Take 1 tablet by mouth 3 (three) times daily. 90 tablet 6  . diltiazem (CARDIZEM) 30 MG tablet TAKE 1 TABLET EVERY 4 HOURS AS NEEDED FOR AFIB HEART RATE >100 45 tablet 1  . doxazosin (CARDURA) 4 MG tablet Take 4 mg by mouth 2 (two) times daily.    Marland Kitchen glimepiride (AMARYL) 4 MG tablet Take 4 mg by mouth 2 (two) times daily.    . hydrochlorothiazide (HYDRODIURIL) 25 MG tablet Take 25 mg by mouth daily.    . hydrocortisone 2.5 % cream Apply topically.    Marland Kitchen losartan (COZAAR) 100 MG tablet Take 100 mg by mouth daily.    Marland Kitchen losartan-hydrochlorothiazide (HYZAAR) 100-25 MG per tablet Take 1 tablet by mouth daily.    . metFORMIN (GLUCOPHAGE) 500 MG tablet Take 500  mg by mouth 2 (two) times daily with a meal.    . metoprolol succinate (TOPROL-XL) 100 MG 24 hr tablet Take 1 tablet (100 mg total) by mouth daily. 30 tablet 0  . ONETOUCH ULTRA test strip USE TO TEST BLOOD SUGAR TWICE DAILY    . simvastatin (ZOCOR) 20 MG tablet Take 20 mg by mouth daily.     . traMADol (ULTRAM) 50 MG tablet Take 50 mg by mouth as needed.      No current facility-administered medications for this visit.    PAST MEDICAL HISTORY: Past Medical History:  Diagnosis Date  . Abnormal cardiac CT angiography 2006   only abnormal with soft plaque in LAD calcium score is0   . Abnormal LFTs   . Balance problem   . CAD (coronary artery disease)    a. has seen Dr. Riley Kill in the past and had cardiac CT 2006 with calcium score of 0. He had soft plaque in LAD of about 50%.  He had a normal stress echo in 2012. He also had a normal nuc in 2016.  Marland Kitchen Chest discomfort   . Diabetes mellitus   . Forgetfulness   . Hyperlipidemia   . Hypertension   . Obesity   . OSA (obstructive sleep apnea)   . Paroxysmal atrial fibrillation (HCC)   . Tremor     PAST SURGICAL HISTORY: Past Surgical History:  Procedure Laterality Date  . ROTATOR CUFF REPAIR  2009   Right  . TRIGGER FINGER RELEASE  02/12/2012   Procedure: MINOR RELEASE TRIGGER FINGER/A-1 PULLEY;  Surgeon: Wyn Forster., MD;  Location: [facility];  Service: Orthopedics;  Laterality: Right;  right thumb    FAMILY HISTORY: Family History  Problem Relation Age of Onset  . Hypertension Mother   . Hypertension Father   . Stroke Father   . Sudden death Sister   . Hypertension Sister   . Alcohol abuse Brother   . Heart attack Neg Hx     SOCIAL HISTORY: Social History   Socioeconomic History  . Marital status: Married    Spouse name: Not on file  . Number of children: 9  . Years of education: two years of college  . Highest education level: Not on file  Occupational History  . Occupation: Retired     Associate Professor: [employer]  Tobacco Use  . Smoking status: Never Smoker  . Smokeless tobacco: Never Used  Vaping Use  . Vaping Use: Never used  Substance and Sexual Activity  . Alcohol use: No  . Drug use: No  . Sexual activity: Not on file  Other Topics Concern  . Not on file  Social History Narrative   Lives at home with his wife.   Ambidextrous.   No daily use  of caffeine, occasional soda.   Social Determinants of Health   Financial Resource Strain: Not on file  Food Insecurity: Not on file  Transportation Needs: Not on file  Physical Activity: Not on file  Stress: Not on file  Social Connections: Not on file  Intimate Partner Violence: Not on file     PHYSICAL EXAM   There were no vitals filed for this visit. Not recorded     There is no height or weight on file to calculate BMI.  PHYSICAL EXAMNIATION:  Gen: NAD, conversant, well nourised, well groomed          NEUROLOGICAL EXAM:  MENTAL STATUS: MMSE - Mini Mental State Exam 11/03/2020  Orientation to  time 5  Orientation to Place 5  Registration 3  Attention/ Calculation 5  Recall 3  Language- name 2 objects 2  Language- repeat 1  Language- follow 3 step command 3  Language- read & follow direction 1  Write a sentence 1  Copy design 1  Total score 30  animal 13  CRANIAL NERVES: CN II: Visual fields are full to confrontation. Pupils are round equal and briskly reactive to light. CN III, IV, VI: extraocular movement are normal. No ptosis. CN V: Facial sensation is intact to light touch CN VII: Face is symmetric with normal eye closure  CN VIII: Hearing is normal to causal conversation. CN IX, X: Phonation is normal. CN XI: Head turning and shoulder shrug are intact  MOTOR: Limited range of left shoulder, due to left shoulder pain, there was no significant bilateral upper or lower extremity weakness, left more than right mild bradykinesia, rigidity,  REFLEXES: Reflexes are 2+ and symmetric at the biceps, triceps, knees, and ankles. Plantar responses are flexor.  SENSORY: Intact to light touch, pinprick and vibratory sensation are intact in fingers and toes.  COORDINATION: There is no trunk or limb dysmetria noted.  GAIT/STANCE: Need to push up to get up from seated position, decreased swing of left arm, moderate stride, tends to drag left foot across the  floor, with tendency for left foot ankle plantarflexion, Romberg is absent.   DIAGNOSTIC DATA (LABS, IMAGING, TESTING) - I reviewed patient records, labs, notes, testing and imaging myself where available.   ASSESSMENT AND PLAN  Dmetrius Ambs is a 65 y.o. male   Gait abnormality   He has mild parkinsonian features on examination, involving left upper and lower extremity more than right, also complains of REM sleep disorder,  MRI of the brain showed moderate supratentorium small vessel disease,   DaTSCAN is pending  Chronic low back pain  EMG nerve conduction study showed no large fiber peripheral neuropathy or bilateral lumbosacral radiculopathy  MRI of lumbar spine showed multilevel degenerative changes most noticeable at L4-5 with moderate foraminal narrowing no canal stenosis,  His lower back pain likely contributed to his complaints of gait abnormality, but less likely to be the sole and major factor.   Levert Feinstein, M.D. Ph.D.  [facility] [address], [address] [place], [place] [address] Ph: [phone] Fax: [phone]  CC:  Charlane Ferretti, DO [address] [place],  [place] [address]

## 2021-01-24 NOTE — Telephone Encounter (Signed)
Hi Guys , Approval Just Came Back this Morning takes around two weeks for DAT scans . Ladona Ridgel From [facility] will call him to schedule this Week . Updated on apt line .  Thanks Annabelle Harman

## 2021-01-25 DIAGNOSIS — M545 Low back pain, unspecified: Secondary | ICD-10-CM | POA: Insufficient documentation

## 2021-01-25 NOTE — Procedures (Signed)
Full Name: Edwin Gallagher Gender: Male MRN #: [id number] Date of Birth: [birth year]    Visit Date: 01/23/2021 09:08 Age: 65 Years Examining Physician: Levert Feinstein, MD  Referring Physician: Levert Feinstein, MD History: 65 year old male, with history of chronic low back pain, gait abnormality Summary of the test: Nerve conduction study: Bilateral sural, superficial peroneal sensory responses were normal.  Bilateral peroneal to EDB and tibial motor responses were normal.  Electromyography: Selected needle examination was performed at bilateral lower extremity muscles and bilateral lumbosacral paraspinal muscles.  There was no significant abnormality noted.   Conclusion: This is a normal study.  There is no electrodiagnostic evidence of large fiber peripheral neuropathy or bilateral lumbosacral radiculopathy.    ------------------------------- Levert Feinstein, M.D. PhD  [facility] [address] [place], [place] [address] Tel: [phone] Fax: [phone]  Verbal informed consent was obtained from the patient, patient was informed of potential risk of procedure, including bruising, bleeding, hematoma formation, infection, muscle weakness, muscle pain, numbness, among others.        MNC    Nerve / Sites Muscle Latency Ref. Amplitude Ref. Rel Amp Segments Distance Velocity Ref. Area    ms ms mV mV %  cm m/s m/s mVms  R Peroneal - EDB     Ankle EDB 4.9 ?6.5 5.5 ?2.0 100 Ankle - EDB 9   16.4     Fib head EDB 12.1  4.3  77.2 Fib head - Ankle 31 44 ?44 13.4     Pop fossa EDB 14.4  4.6  108 Pop fossa - Fib head 10 44 ?44 15.5         Pop fossa - Ankle      L Peroneal - EDB     Ankle EDB 4.8 ?6.5 2.7 ?2.0 100 Ankle - EDB 9   8.7     Fib head EDB 12.1  2.0  74.3 Fib head - Ankle 32 44 ?44 6.7     Pop fossa EDB 14.4  2.0  99.3 Pop fossa - Fib head 10 44 ?44 7.0         Pop fossa - Ankle      R Tibial - AH     Ankle AH 5.7 ?5.8 6.2 ?4.0 100 Ankle - AH 9   14.4      Pop fossa AH 15.7  5.1  82 Pop fossa - Ankle 42 42 ?41 17.3  L Tibial - AH     Ankle AH 5.0 ?5.8 5.8 ?4.0 100 Ankle - AH 9   16.8     Pop fossa AH 14.6  5.0  85.8 Pop fossa - Ankle 40 42 ?41 15.7             SNC    Nerve / Sites Rec. Site Peak Lat Ref.  Amp Ref. Segments Distance    ms ms V V  cm  R Sural - Ankle (Calf)     Calf Ankle 3.5 ?4.4 6 ?6 Calf - Ankle 14  L Sural - Ankle (Calf)     Calf Ankle 3.3 ?4.4 6 ?6 Calf - Ankle 14  R Superficial peroneal - Ankle     Lat leg Ankle 4.1 ?4.4 6 ?6 Lat leg - Ankle 14  L Superficial peroneal - Ankle     Lat leg Ankle 4.1 ?4.4 6 ?6 Lat leg - Ankle 14             F  Wave    Nerve F Lat Ref.   ms ms  R Tibial - AH 62.0 ?56.0  L Tibial - AH 64.9 ?56.0         EMG Summary Table    Spontaneous MUAP Recruitment  Muscle IA Fib PSW Fasc Other Amp Dur. Poly Pattern  L. Tibialis anterior Normal None None None _______ Normal Normal Normal Normal  L. Tibialis posterior Normal None None None _______ Normal Normal Normal Normal  L. Peroneus longus Normal None None None _______ Normal Normal Normal Normal  L. Gastrocnemius (Medial head) Normal None None None _______ Normal Normal Normal Normal  L. Vastus lateralis Normal None None None _______ Normal Normal Normal Normal  R. Tibialis anterior Normal None None None _______ Normal Normal Normal Normal  R. Tibialis posterior Normal None None None _______ Normal Normal Normal Normal  R. Peroneus longus Normal None None None _______ Normal Normal Normal Normal  R. Vastus lateralis Normal None None None _______ Normal Normal Normal Normal  R. Lumbar paraspinals (low) Normal None None None _______ Normal Normal Normal Normal  R. Lumbar paraspinals (mid) Normal None None None _______ Normal Normal Normal Normal  L. Lumbar paraspinals (low) Normal None None None _______ Normal Normal Normal Normal  L. Lumbar paraspinals (mid) Normal None None None _______ Normal Normal Normal Normal

## 2021-01-27 ENCOUNTER — Telehealth: Payer: Self-pay | Admitting: Neurology

## 2021-01-27 MED ORDER — DULOXETINE HCL 60 MG PO CPEP
60.0000 mg | ORAL_CAPSULE | Freq: Every day | ORAL | 12 refills | Status: DC
Start: 2021-01-27 — End: 2021-02-21

## 2021-01-27 NOTE — Telephone Encounter (Addendum)
I called the patient back and he was very appreciative. He will pick up the medication today.

## 2021-01-27 NOTE — Telephone Encounter (Signed)
I called the patient back. States he discuss getting a medication called in for his pain after having his NCV/EMG on 01/23/21. I told him we would need to check with Dr. Terrace Arabia to ask her what she had in mind for him. He would like the prescription sent to [facility] in [place]. He is aware it may be Monday.

## 2021-01-27 NOTE — Telephone Encounter (Signed)
Meds ordered this encounter  Medications  . DULoxetine (CYMBALTA) 60 MG capsule    Sig: Take 1 capsule (60 mg total) by mouth daily.    Dispense:  30 capsule    Refill:  12

## 2021-01-27 NOTE — Telephone Encounter (Signed)
Pt. is requesting a call back from RN regarding medication that doctor has prescribed or suggested that he takes.

## 2021-01-27 NOTE — Addendum Note (Signed)
Addended by: Levert Feinstein on: 01/27/2021 01:05 PM   Modules accepted: Orders

## 2021-02-16 ENCOUNTER — Telehealth: Payer: Self-pay | Admitting: Neurology

## 2021-02-16 ENCOUNTER — Other Ambulatory Visit: Payer: Self-pay

## 2021-02-16 ENCOUNTER — Ambulatory Visit (HOSPITAL_COMMUNITY)
Admission: RE | Admit: 2021-02-16 | Discharge: 2021-02-16 | Disposition: A | Discharge status: Home / Self Care | Payer: 59 | Source: Ambulatory Visit | Attending: Neurology | Admitting: Neurology

## 2021-02-16 ENCOUNTER — Encounter (HOSPITAL_COMMUNITY)
Admission: RE | Admit: 2021-02-16 | Discharge: 2021-02-16 | Disposition: A | Discharge status: Home / Self Care | Payer: 59 | Source: Ambulatory Visit | Attending: Neurology | Admitting: Neurology

## 2021-02-16 DIAGNOSIS — R251 Tremor, unspecified: Secondary | ICD-10-CM | POA: Diagnosis present

## 2021-02-16 DIAGNOSIS — R269 Unspecified abnormalities of gait and mobility: Secondary | ICD-10-CM | POA: Insufficient documentation

## 2021-02-16 MED ORDER — POTASSIUM IODIDE (ANTIDOTE) 130 MG PO TABS: 130.0000 mg | ORAL_TABLET | Freq: Once | ORAL | Status: AC

## 2021-02-16 MED ORDER — IOFLUPANE I 123 185 MBQ/2.5ML IV SOLN
4.6000 | Freq: Once | INTRAVENOUS | Status: AC | PRN
  Administered 2021-02-16: 4.6 via INTRAVENOUS
  Filled 2021-02-16: qty 5

## 2021-02-16 MED ORDER — POTASSIUM IODIDE (ANTIDOTE) 130 MG PO TABS
ORAL_TABLET | ORAL | Status: AC
  Administered 2021-02-16: 130 mg via ORAL
  Filled 2021-02-16: qty 1

## 2021-02-16 NOTE — Telephone Encounter (Signed)
  IMPRESSION: Asymmetric decreased radiotracer activity within the LEFT and RIGHT striatum with greater deficit on the RIGHT. This pattern has been associated with Parkinsonian syndrome pathologies.  Of note, DaTSCAN is not diagnostic of Parkinsonian syndromes, which remains a clinical diagnosis. DaTscan is an adjuvant test to aid in the clinical diagnosis of Parkinsonian syndromes.  Please call patient, DaTSCAN does show asymmetric decreased radiotracer activity within the left and right stratum, with greater deficit on the right side, this packings has been associated with parkinsonian pathology  Give him a follow-up appointment with me in 2 to 4 weeks

## 2021-02-17 NOTE — Telephone Encounter (Signed)
I spoke to the patient and provided him with the results. He requested an earlier appt to discuss with Dr. Terrace Arabia. He has been scheduled on 02/21/21.

## 2021-02-18 ENCOUNTER — Other Ambulatory Visit: Payer: Self-pay | Admitting: Neurology

## 2021-02-21 ENCOUNTER — Ambulatory Visit (INDEPENDENT_AMBULATORY_CARE_PROVIDER_SITE_OTHER): Payer: 59 | Admitting: Neurology

## 2021-02-21 ENCOUNTER — Encounter: Payer: Self-pay | Admitting: Neurology

## 2021-02-21 VITALS — BP 120/67 | HR 73 | Ht 72.0 in | Wt 264.5 lb

## 2021-02-21 DIAGNOSIS — R269 Unspecified abnormalities of gait and mobility: Secondary | ICD-10-CM | POA: Diagnosis not present

## 2021-02-21 DIAGNOSIS — M545 Low back pain, unspecified: Secondary | ICD-10-CM | POA: Diagnosis not present

## 2021-02-21 DIAGNOSIS — G20 Parkinson's disease: Secondary | ICD-10-CM | POA: Diagnosis not present

## 2021-02-21 DIAGNOSIS — G3184 Mild cognitive impairment, so stated: Secondary | ICD-10-CM | POA: Diagnosis not present

## 2021-02-21 MED ORDER — RASAGILINE MESYLATE 1 MG PO TABS: 1.0000 mg | ORAL_TABLET | Freq: Every day | ORAL | 11 refills | Status: DC

## 2021-02-21 NOTE — Progress Notes (Signed)
ASSESSMENT AND PLAN  Edwin Gallagher is a 65 y.o. male   Parkinson's disease Mild cognitive impairment   He has mild parkinsonian features on examination, involving left upper and lower extremity more than right, also complains of REM sleep disorder,  MRI of the brain showed moderate supratentorium small vessel disease,   DaTSCAN showed asymmetry, decreased radiotracer activity within the left and right stratum, with greater deficit on the right  He reported mild improvement with Sinemet only, overall only mild gait abnormality  Will refer him to physical therapy, stop Sinemet, to better evaluate if he has positive response to medications   starting Azilect 1 mg daily  Will consider dopamine agonist at next visit  Chronic low back pain  EMG nerve conduction study showed no large fiber peripheral neuropathy or bilateral lumbosacral radiculopathy  MRI of lumbar spine showed multilevel degenerative changes most noticeable at L4-5 with moderate foraminal narrowing no canal stenosis,  His lower back pain likely contributed to his complaints of gait abnormality, but less likely to be the sole and major factor  Hope to improve with physical therapy, also encouraging him moderate exercise.    DIAGNOSTIC DATA (LABS, IMAGING, TESTING) - I reviewed patient records, labs, notes, testing and imaging myself where available.   DaTscan February 16 2021:  Asymmetric decreased radiotracer activity within the LEFT and RIGHT striatum with greater deficit on the RIGHT. This pattern has been associated with Parkinsonian syndrome pathologies.  Of note, DaTSCAN is not diagnostic of Parkinsonian syndromes, which remains a clinical diagnosis. DaTscan is an adjuvant test to aid in the clinical diagnosis of Parkinsonian syndromes.  MRI lumbar spine Jan 01 2021: Lumbar spine degeneration which is most notable at L4-5 where there is advanced facet osteoarthritis with anterolisthesis and moderate bilateral  foraminal impingement. Subarticular recess narrowing on the left more than right at the same level.   MRI of the brain without contrast shows the following: 1.   Multiple T2/FLAIR hyperintense foci in the subcortical and deep white matter of the hemispheres.  None of these appear to be acute.  This is most consistent with moderate chronic microvascular ischemic change.  Demyelination would be much less likely. 2.   No acute findings.   PHYSICAL EXAM   Vitals:   02/21/21 1536  BP: 120/67  Pulse: 73  Weight: 264 lb 8 oz (120 kg)  Height: 6' (1.829 m)   Not recorded     Body mass index is 35.87 kg/m.  PHYSICAL EXAMNIATION:  Gen: NAD, conversant, well nourised, well groomed          NEUROLOGICAL EXAM:  MENTAL STATUS: mild slow reaction time  Montreal Cognitive Assessment  02/21/2021  Visuospatial/ Executive (0/5) 3  Naming (0/3) 3  Attention: Read list of digits (0/2) 2  Attention: Read list of letters (0/1) 1  Attention: Serial 7 subtraction starting at 100 (0/3) 1  Language: Repeat phrase (0/2) 2  Language : Fluency (0/1) 1  Abstraction (0/2) 2  Delayed Recall (0/5) 3  Orientation (0/6) 6  Total 24   CRANIAL NERVES: CN II: Visual fields are full to confrontation. Pupils are round equal and briskly reactive to light. CN III, IV, VI: extraocular movement are normal. No ptosis. CN V: Facial sensation is intact to light touch CN VII: Face is symmetric with normal eye closure  CN VIII: Hearing is normal to causal conversation. CN IX, X: Phonation is normal. CN XI: Head turning and shoulder shrug are intact  MOTOR: Left hand  resting tremor, mild left more than right rigidity, bradykinesia, there was no significant bilateral upper or lower extremity weakness,   REFLEXES: Reflexes are 1 and symmetric at the biceps, triceps, knees, and ankles. Plantar responses are flexor.  SENSORY: Intact to light touch, pinprick    COORDINATION: There is no trunk or limb  dysmetria noted.  GAIT/STANCE: Need to push up to get up from seated position, decreased swing of left arm, moderate stride, tends to drag left foot across the floor, with tendency for left foot ankle plantarflexion, Romberg is absent.   HISTORICAL  Edwin Gallagher is a 65 year old male, seen in request by his primary care physician Dr. Charlane Ferretti for evaluation of balance concerns, sleep issues, forgetfulness, initial evaluation was on November 03, 2020  I reviewed and summarized the referring note. PMHX. HTN HLD DM Atrial fibrillation, on Eliquis,  He retired from [employer] in 2017, since then, he become less active, still drive his children to school, doing some yard work, over the past 4 years, he noticed decreased stamina, decreased energy, mild balance issues,  He used to work heavy labor at [employer], he denies family history of memory loss,  He was noted by his family that he moves a lot during sleep, talking out of dreams,  He also complains of left shoulder pain, he has to focus on lifting his left leg while ambulating, more noticeable walking longer distances, he also have left hand resting tremor  He denied loss of sense of smell, no constipations,  Laboratory evaluation in October 2021, normal CMP, potassium of 3.2, CBC, hemoglobin of 13.3, B12 294, LDL 108 TSH 1.28,  UPDATE Jan 05 2021: He is alone at today's visit, complains of worsening low back pain, increased gait abnormality, tends to trip his left foot across the floor,  We personally reviewed MRI of the brain in January 2022, moderate supratentorium small vessel disease, no acute abnormality,  He was noted to have left more than right rigidity, bradykinesia,  We also personally reviewed MRI of lumbar spine Lumbar spine degeneration which is most notable at L4-5 where there is advanced facet osteoarthritis with anterolisthesis and moderate bilateral foraminal impingement. Subarticular recess narrowing on the left more  than right at the same level.  Update January 23, 2021: He return for electrodiagnostic study today, which showed no significant abnormality, in specific, no evidence of large fiber peripheral neuropathy, or bilateral lumbosacral radiculopathy  He continued complaints of low back pain, gait abnormality  He was noted to have mild parkinsonian features, DaTSCAN is pending, slight improvement with Sinemet/25/100 mg 3 times daily  UPDATE February 21 2021: He is accompanied by his wife at today's clinical visit, we went over all his imaging study, confirmed his medical history, excessive movement during sleep, frequent awakening at nighttime, obstructive sleep apnea, improved with CPAP machine,  He tried Sinemet 25/100 mg 3 times daily, initially reported moderate improvement, now was not sure about the benefit, he does take 3 times a day, but sometimes not on schedule,  His low back pain has improved, still dragging his left leg across the floor some, DaTSCAN in April 2022 showed asymmetry decreased radiotracer activity was in the left and right stratum, with greater deficit on the right this pattern associated with parkinsonian syndrome pathology  REVIEW OF SYSTEMS:  Full 14 system review of systems performed and notable only for as above All other review of systems were negative.  ALLERGIES: No Known Allergies  HOME MEDICATIONS: Current Outpatient Medications  Medication  Sig Dispense Refill  . amLODipine (NORVASC) 10 MG tablet Take 10 mg by mouth daily.    Marland Kitchen apixaban (ELIQUIS) 5 MG TABS tablet Take 1 tablet (5 mg total) by mouth 2 (two) times daily. 60 tablet 3  . carbidopa-levodopa (SINEMET IR) 25-100 MG tablet Take 1 tablet by mouth 3 (three) times daily. 90 tablet 6  . diltiazem (CARDIZEM) 30 MG tablet TAKE 1 TABLET EVERY 4 HOURS AS NEEDED FOR AFIB HEART RATE >100 45 tablet 1  . doxazosin (CARDURA) 4 MG tablet Take 4 mg by mouth 2 (two) times daily.    . DULoxetine (CYMBALTA) 60 MG  capsule TAKE 1 CAPSULE BY MOUTH EVERY DAY 90 capsule 3  . glimepiride (AMARYL) 4 MG tablet Take 4 mg by mouth 2 (two) times daily.    . hydrochlorothiazide (HYDRODIURIL) 25 MG tablet Take 25 mg by mouth daily.    . hydrocortisone 2.5 % cream Apply topically.    Marland Kitchen losartan (COZAAR) 100 MG tablet Take 100 mg by mouth daily.    Marland Kitchen losartan-hydrochlorothiazide (HYZAAR) 100-25 MG per tablet Take 1 tablet by mouth daily.    . metFORMIN (GLUCOPHAGE) 500 MG tablet Take 500 mg by mouth 2 (two) times daily with a meal.    . metoprolol succinate (TOPROL-XL) 100 MG 24 hr tablet Take 1 tablet (100 mg total) by mouth daily. 30 tablet 0  . ONETOUCH ULTRA test strip USE TO TEST BLOOD SUGAR TWICE DAILY    . simvastatin (ZOCOR) 20 MG tablet Take 20 mg by mouth daily.     . traMADol (ULTRAM) 50 MG tablet Take 50 mg by mouth as needed.     No current facility-administered medications for this visit.    PAST MEDICAL HISTORY: Past Medical History:  Diagnosis Date  . Abnormal cardiac CT angiography 2006   only abnormal with soft plaque in LAD calcium score is0   . Abnormal LFTs   . Balance problem   . CAD (coronary artery disease)    a. has seen Dr. Riley Kill in the past and had cardiac CT 2006 with calcium score of 0. He had soft plaque in LAD of about 50%.  He had a normal stress echo in 2012. He also had a normal nuc in 2016.  Marland Kitchen Chest discomfort   . Diabetes mellitus   . Forgetfulness   . Hyperlipidemia   . Hypertension   . Obesity   . OSA (obstructive sleep apnea)   . Paroxysmal atrial fibrillation (HCC)   . Tremor     PAST SURGICAL HISTORY: Past Surgical History:  Procedure Laterality Date  . ROTATOR CUFF REPAIR  2009   Right  . TRIGGER FINGER RELEASE  02/12/2012   Procedure: MINOR RELEASE TRIGGER FINGER/A-1 PULLEY;  Surgeon: Wyn Forster., MD;  Location: [facility];  Service: Orthopedics;  Laterality: Right;  right thumb    FAMILY HISTORY: Family History  Problem Relation  Age of Onset  . Hypertension Mother   . Hypertension Father   . Stroke Father   . Sudden death Sister   . Hypertension Sister   . Alcohol abuse Brother   . Heart attack Neg Hx     SOCIAL HISTORY: Social History   Socioeconomic History  . Marital status: Married    Spouse name: Not on file  . Number of children: 9  . Years of education: two years of college  . Highest education level: Not on file  Occupational History  . Occupation: Retired  Employer: [employer]  Tobacco Use  . Smoking status: Never Smoker  . Smokeless tobacco: Never Used  Vaping Use  . Vaping Use: Never used  Substance and Sexual Activity  . Alcohol use: No  . Drug use: No  . Sexual activity: Not on file  Other Topics Concern  . Not on file  Social History Narrative   Lives at home with his wife.   Ambidextrous.   No daily use of caffeine, occasional soda.   Social Determinants of Health   Financial Resource Strain: Not on file  Food Insecurity: Not on file  Transportation Needs: Not on file  Physical Activity: Not on file  Stress: Not on file  Social Connections: Not on file  Intimate Partner Violence: Not on file         Levert Feinstein, M.D. Ph.D.  [facility] [address] [place], [place] [address] Ph: [phone] Fax: [phone]  CC:  Charlane Ferretti, DO [address] [place],  [place] [address]

## 2021-03-14 ENCOUNTER — Telehealth: Payer: Self-pay | Admitting: Neurology

## 2021-03-14 NOTE — Telephone Encounter (Signed)
[facility]) called, verify drug interaction for rasagiline (AZILECT) 1 MG TABS tablet and DULoxetine (CYMBALTA) 60 MG capsule. Would like a call from the nurse

## 2021-03-14 NOTE — Telephone Encounter (Addendum)
I called [facility] back and spoke to [facility]. Per vo by Dr. Terrace Arabia, okay for patient to take both medications at these doses. The pharmacist will prepare them for pick up.

## 2021-03-15 ENCOUNTER — Ambulatory Visit: Payer: 59 | Admitting: Podiatry

## 2021-03-22 ENCOUNTER — Other Ambulatory Visit: Payer: Self-pay

## 2021-03-22 ENCOUNTER — Encounter: Payer: Self-pay | Admitting: Podiatry

## 2021-03-22 ENCOUNTER — Ambulatory Visit (INDEPENDENT_AMBULATORY_CARE_PROVIDER_SITE_OTHER): Payer: 59 | Admitting: Podiatry

## 2021-03-22 DIAGNOSIS — M79675 Pain in left toe(s): Secondary | ICD-10-CM | POA: Diagnosis not present

## 2021-03-22 DIAGNOSIS — E1140 Type 2 diabetes mellitus with diabetic neuropathy, unspecified: Secondary | ICD-10-CM | POA: Diagnosis not present

## 2021-03-22 DIAGNOSIS — B351 Tinea unguium: Secondary | ICD-10-CM | POA: Diagnosis not present

## 2021-03-22 DIAGNOSIS — D689 Coagulation defect, unspecified: Secondary | ICD-10-CM

## 2021-03-22 DIAGNOSIS — E1149 Type 2 diabetes mellitus with other diabetic neurological complication: Secondary | ICD-10-CM

## 2021-03-22 DIAGNOSIS — M79674 Pain in right toe(s): Secondary | ICD-10-CM

## 2021-03-22 NOTE — Progress Notes (Signed)
This patient returns to my office for at risk foot care.  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  This patient is unable to cut nails himself since the patient cannot reach his nails.These nails are painful walking and wearing shoes.  This patient presents for at risk foot care today.  General Appearance  Alert, conversant and in no acute stress.  Vascular  Dorsalis pedis and posterior tibial  pulses are palpable  bilaterally.  Capillary return is within normal limits  bilaterally. Temperature is within normal limits  bilaterally.  Neurologic  Senn-Weinstein monofilament wire test within normal limits  bilaterally. Muscle power within normal limits bilaterally.  Nails Thick disfigured discolored nails with subungual debris  from hallux to fifth toes bilaterally. No evidence of bacterial infection or drainage bilaterally.  Orthopedic  No limitations of motion  feet .  No crepitus or effusions noted.  No bony pathology or digital deformities noted. Mild HAV  B/L.  ADV 5th digit  B/L.  Skin  normotropic skin with no porokeratosis noted bilaterally.  No signs of infections or ulcers noted.     Onychomycosis  Pain in right toes  Pain in left toes  Consent was obtained for treatment procedures.   Mechanical debridement of nails 1-5  bilaterally performed with a nail nipper.  Filed with dremel without incident.    Return office visit     3 months                 Told patient to return for periodic foot care and evaluation due to potential at risk complications.     DPM  

## 2021-04-11 ENCOUNTER — Other Ambulatory Visit: Payer: Self-pay | Admitting: Neurology

## 2021-04-12 DIAGNOSIS — Z0271 Encounter for disability determination: Secondary | ICD-10-CM

## 2021-05-25 IMAGING — MR MR LUMBAR SPINE W/O CM
4 of 5 series · 21 of 48 positions shown · non-contrast
Comparison: None.

CLINICAL DATA: Low back pain causing tingling and numbness in the
left leg for months

EXAM:
MRI LUMBAR SPINE WITHOUT CONTRAST
TECHNIQUE: Multiplanar, multisequence MR imaging of the lumbar spine was
performed. No intravenous contrast was administered.

[Series 6: T2 · sagittal · 4.0mm · 0.76mm/px · 7 of 19 slices shown (1 of 2)]
[im 1/19]
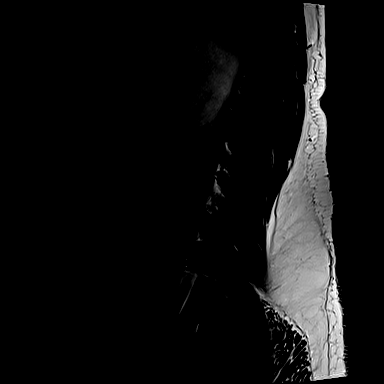
[im 4/19]
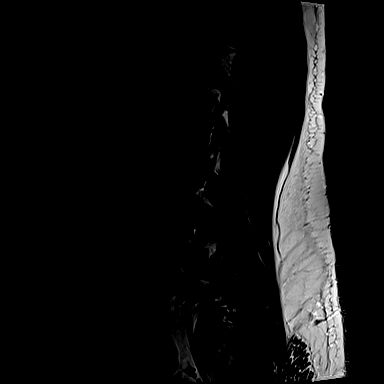
[im 7/19]
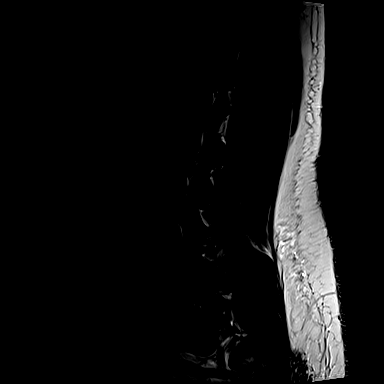
[im 10/19]
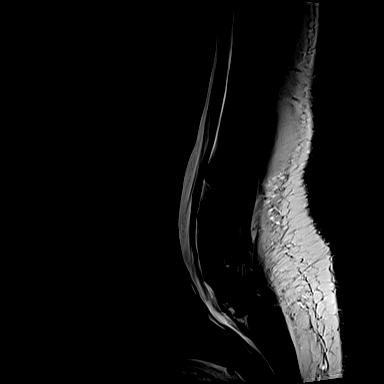
[im 13/19]
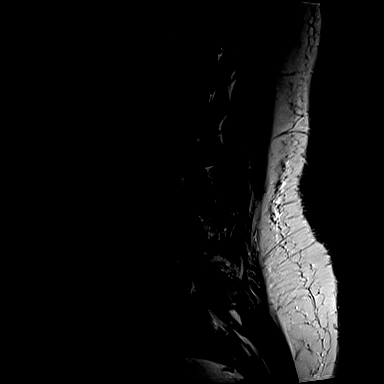
[im 16/19]
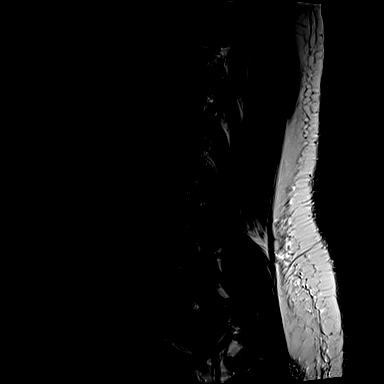
[im 19/19]
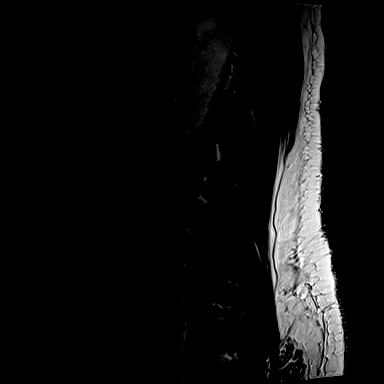

[Series 7: T1 · sagittal · 4.0mm · 0.73mm/px · 3 of 19 slices shown (1 of 2)]
[im 4/19]
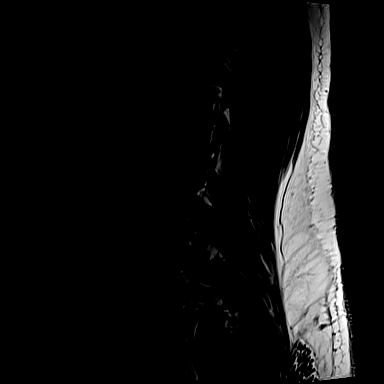
[im 10/19]
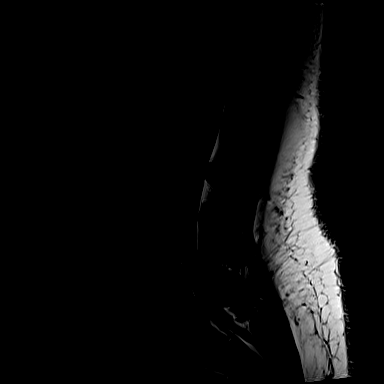
[im 16/19]
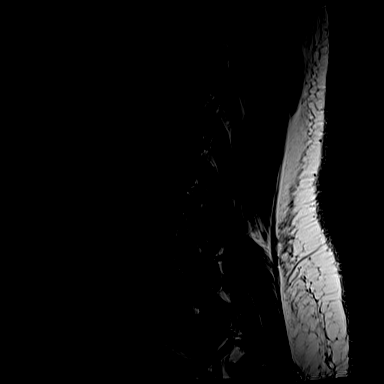

[Series 11: T1 · axial · 4.0mm · 0.28mm/px · z∈[-41,+133]mm · 3 of 42 slices shown (2 of 2)]
[im 7/42]
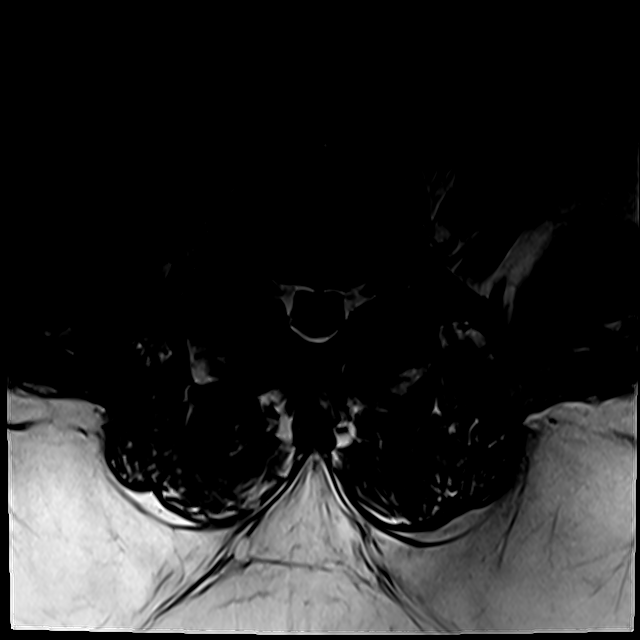
[im 23/42]
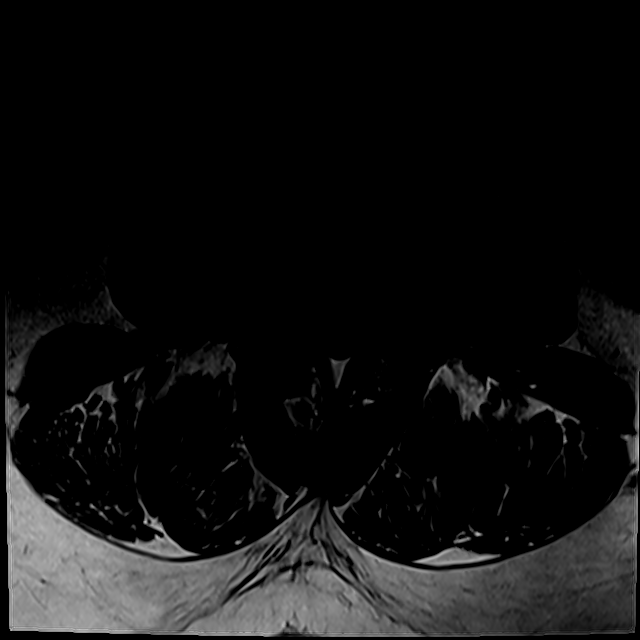
[im 35/42]
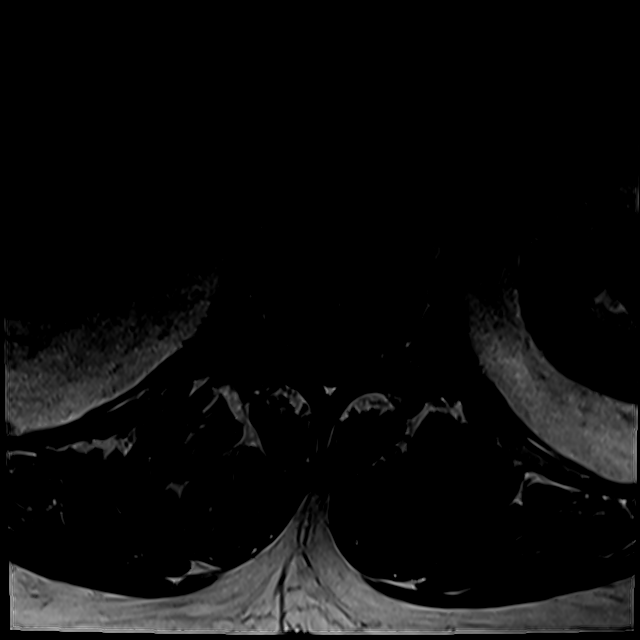

[Series 14: T2 · axial · 4.0mm · 0.28mm/px · z∈[-69,+168]mm · 8 of 42 slices shown (2 of 2)]
[im 1/42]
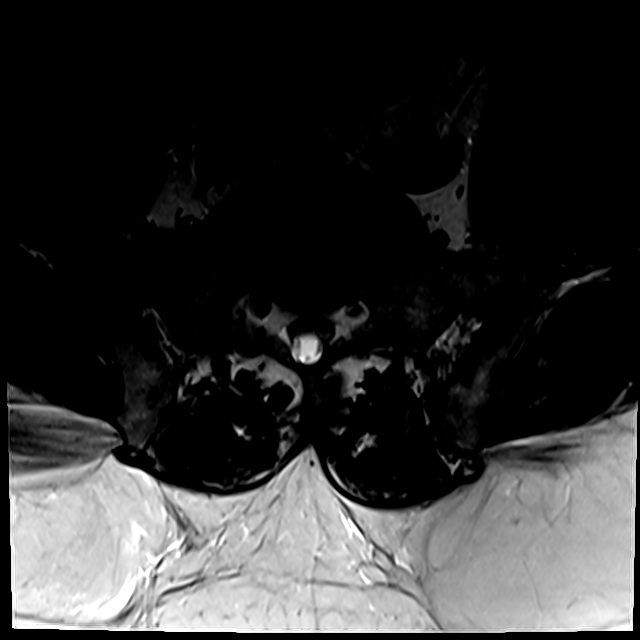
[im 7/42]
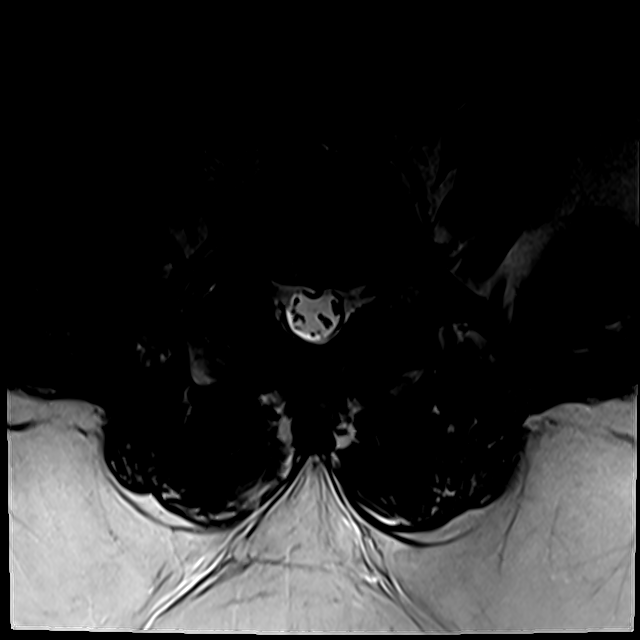
[im 13/42]
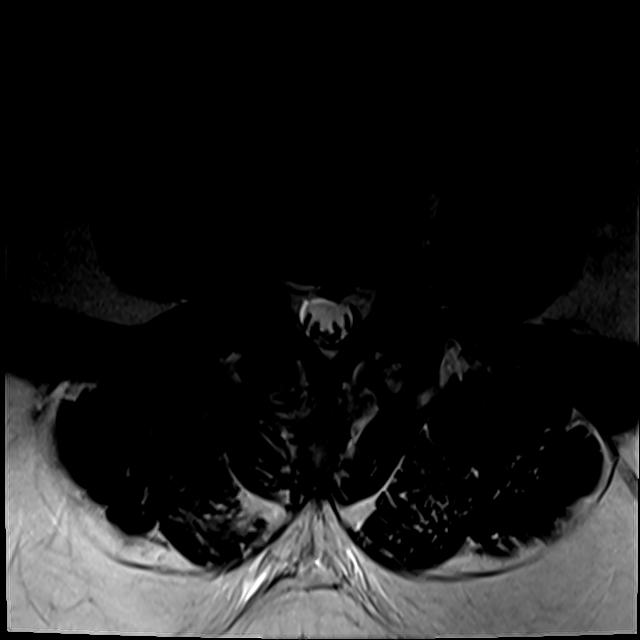
[im 19/42]
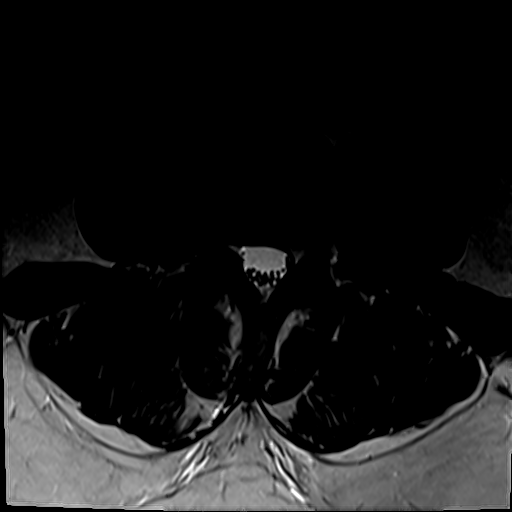
[im 23/42]
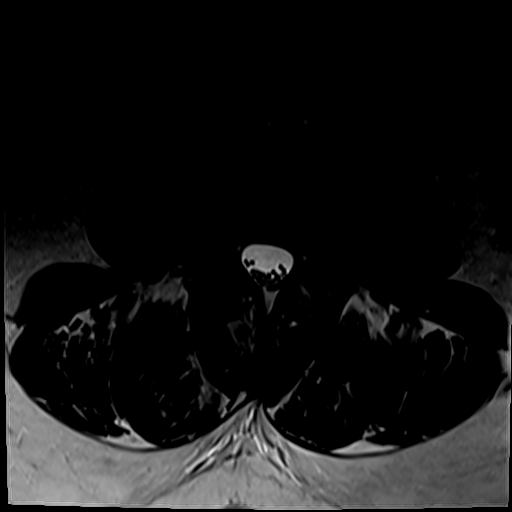
[im 29/42]
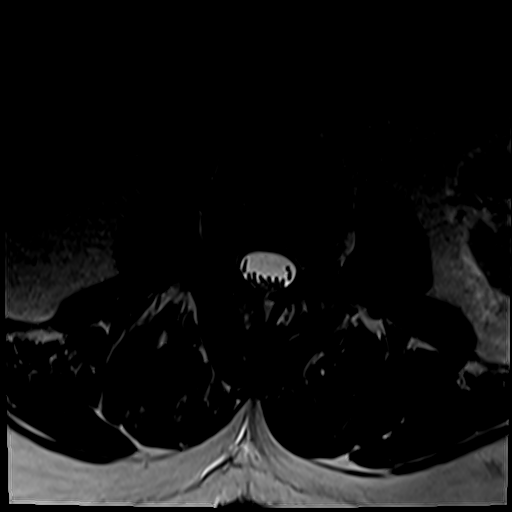
[im 35/42]
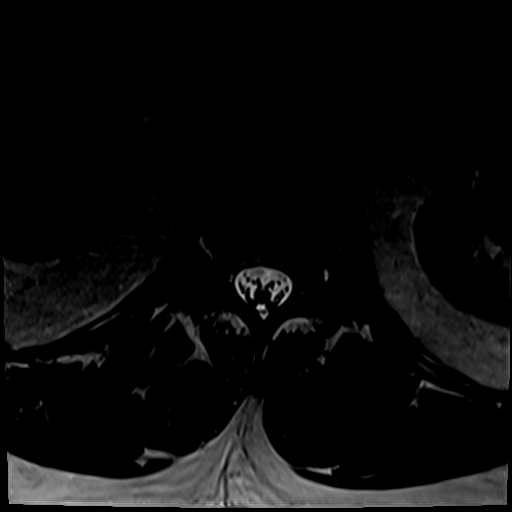
[im 42/42]
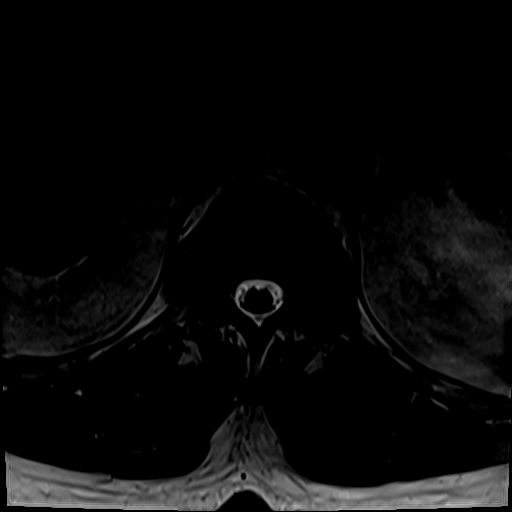

[21 of 48 positions shown; findings below may reference images not displayed]

FINDINGS: Segmentation:  5 lumbar type

Alignment:  Facet mediated grade 1 anterolisthesis at L4-5

Vertebrae:  No fracture, evidence of discitis, or bone lesion.

Conus medullaris and cauda equina: Conus extends to the L1 level.
Conus and cauda equina appear normal.

Paraspinal and other soft tissues: Negative

Disc levels:

T12- L1: Unremarkable.

L1-L2: Annulus bulging which is mainly ventral.

L2-L3: Unremarkable.

L3-L4: Mild degenerative facet spurring and annulus bulging

L4-L5: Facet osteoarthritis with spurring and ligamentum flavum
thickening. The disc is narrowed and bulging with a left foraminal
protrusion. Moderate bilateral foraminal stenosis, worse on the
right. Triangular narrowing of the thecal sac effacing the
subarticular recesses, greater on the left but not clearly
compressive.

L5-S1:Facet spurring and mild annulus bulging. Mild bilateral
foraminal narrowing
IMPRESSION: Lumbar spine degeneration which is most notable at L4-5 where there
is advanced facet osteoarthritis with anterolisthesis and moderate
bilateral foraminal impingement. Subarticular recess narrowing on
the left more than right at the same level.

## 2021-06-27 ENCOUNTER — Ambulatory Visit: Payer: 59 | Admitting: Neurology

## 2021-06-27 ENCOUNTER — Other Ambulatory Visit: Payer: Self-pay

## 2021-06-27 ENCOUNTER — Ambulatory Visit (INDEPENDENT_AMBULATORY_CARE_PROVIDER_SITE_OTHER): Payer: 59 | Admitting: Neurology

## 2021-06-27 ENCOUNTER — Encounter: Payer: Self-pay | Admitting: Neurology

## 2021-06-27 VITALS — BP 132/72 | HR 54 | Ht 72.0 in | Wt 267.6 lb

## 2021-06-27 DIAGNOSIS — G4752 REM sleep behavior disorder: Secondary | ICD-10-CM

## 2021-06-27 DIAGNOSIS — G3184 Mild cognitive impairment, so stated: Secondary | ICD-10-CM | POA: Diagnosis not present

## 2021-06-27 DIAGNOSIS — R269 Unspecified abnormalities of gait and mobility: Secondary | ICD-10-CM

## 2021-06-27 DIAGNOSIS — G20 Parkinson's disease: Secondary | ICD-10-CM

## 2021-06-27 MED ORDER — DONEPEZIL HCL 10 MG PO TABS: 10.0000 mg | ORAL_TABLET | Freq: Every day | ORAL | 11 refills | Status: DC

## 2021-06-27 MED ORDER — ROPINIROLE HCL ER 2 MG PO TB24: 4.0000 mg | ORAL_TABLET | Freq: Every day | ORAL | 6 refills | Status: DC

## 2021-06-27 NOTE — Progress Notes (Signed)
ASSESSMENT AND PLAN  Edwin Gallagher is a 65 y.o. male   Parkinsonism  Doubt it is typical Parkinson's disease, parkinsonian features mainly affecting left side, but not responsive to Sinemet 25/100mg  tid, no longer on it  Mild cognitive impairment MoCA examination 29/30 on May 27, 2021  REM sleep disorder   He has mild parkinsonian features on examination, involving left upper and lower extremity more than right, also complains of REM sleep disorder,  MRI of the brain showed moderate supratentorium small vessel disease,   DaTSCAN showed asymmetry, decreased radiotracer activity within the left and right stratum, with greater deficit on the right  He reported no significant improvement with Sinemet 25/100 mg 3 times a day, he has only mild gait abnormality, after discussed with patient, we decided to stop Sinemet  Keep Azilect 1 mg daily, add on Requip XR 2 mg titrating to 2 tablets every night  Aricept 10 mg every night, also encouraging melatonin 1 mg every night  Chronic low back pain  EMG nerve conduction study showed no large fiber peripheral neuropathy or bilateral lumbosacral radiculopathy  MRI of lumbar spine showed multilevel degenerative changes most noticeable at L4-5 with moderate foraminal narrowing no canal stenosis,  His lower back pain likely contributed to his complaints of gait abnormality, but less likely to be the sole and major factor  Hope to improve with physical therapy, also encouraging him moderate exercise.    DIAGNOSTIC DATA (LABS, IMAGING, TESTING) - I reviewed patient records, labs, notes, testing and imaging myself where available.   DaTscan February 16 2021:  Asymmetric decreased radiotracer activity within the LEFT and RIGHT striatum with greater deficit on the RIGHT. This pattern has been associated with Parkinsonian syndrome pathologies.   Of note, DaTSCAN is not diagnostic of Parkinsonian syndromes, which remains a clinical diagnosis. DaTscan is an  adjuvant test to aid in the clinical diagnosis of Parkinsonian syndromes.  MRI lumbar spine Jan 01 2021: Lumbar spine degeneration which is most notable at L4-5 where there is advanced facet osteoarthritis with anterolisthesis and moderate bilateral foraminal impingement. Subarticular recess narrowing on the left more than right at the same level.    MRI of the brain without contrast shows the following: 1.   Multiple T2/FLAIR hyperintense foci in the subcortical and deep white matter of the hemispheres.  None of these appear to be acute.  This is most consistent with moderate chronic microvascular ischemic change.  Demyelination would be much less likely. 2.   No acute findings.      HISTORICAL  Edwin Gallagher is a 65 year old male, seen in request by his primary care physician Dr. Charlane Ferretti for evaluation of balance concerns, sleep issues, forgetfulness, initial evaluation was on November 03, 2020  I reviewed and summarized the referring note. PMHX. HTN HLD DM Atrial fibrillation, on Eliquis,  He retired from [employer] in 2017, since then, he become less active, still drive his children to school, doing some yard work, over the past 4 years, he noticed decreased stamina, decreased energy, mild balance issues,  He used to work heavy labor at [employer], he denies family history of memory loss,  He was noted by his family that he moves a lot during sleep, talking out of dreams,  He also complains of left shoulder pain, he has to focus on lifting his left leg while ambulating, more noticeable walking longer distances, he also have left hand resting tremor  He denied loss of sense of smell, no constipations,  Laboratory  evaluation in October 2021, normal CMP, potassium of 3.2, CBC, hemoglobin of 13.3, B12 294, LDL 108 TSH 1.28,  UPDATE Jan 05 2021: He is alone at today's visit, complains of worsening low back pain, increased gait abnormality, tends to trip his left foot across the  floor,  We personally reviewed MRI of the brain in January 2022, moderate supratentorium small vessel disease, no acute abnormality,  He was noted to have left more than right rigidity, bradykinesia,   We also personally reviewed MRI of lumbar spine Lumbar spine degeneration which is most notable at L4-5 where there is advanced facet osteoarthritis with anterolisthesis and moderate bilateral foraminal impingement. Subarticular recess narrowing on the left more than right at the same level.  Update January 23, 2021: He return for electrodiagnostic study today, which showed no significant abnormality, in specific, no evidence of large fiber peripheral neuropathy, or bilateral lumbosacral radiculopathy  He continued complaints of low back pain, gait abnormality  He was noted to have mild parkinsonian features, DaTSCAN is pending, slight improvement with Sinemet/25/100 mg 3 times daily  UPDATE February 21 2021: He is accompanied by his wife at today's clinical visit, we went over all his imaging study, confirmed his medical history, excessive movement during sleep, frequent awakening at nighttime, obstructive sleep apnea, improved with CPAP machine,  He tried Sinemet 25/100 mg 3 times daily, initially reported moderate improvement, now was not sure about the benefit, he does take 3 times a day, but sometimes not on schedule,  His low back pain has improved, still dragging his left leg across the floor some, DaTSCAN in April 2022 showed asymmetry decreased radiotracer activity was in the left and right stratum, with greater deficit on the right this pattern associated with parkinsonian syndrome pathology  UPDATE June 27 2021: He is accompanied by his wife at today's clinical visit, he stopped his Sinemet 25/100 3 times daily, did not notice any difference taking medications, continue has mild gait difficulty, dragging left leg, left hand tremor, mild mental slowing, described brain freeze  movement, long string of the thoughts, when he gets to a different room, sometimes he forgot why he was there, today's MoCA examination 29/30, he is pastoring a local church  He has obstructive sleep apnea, tolerating CPAP, no longer snoring, but he has frequent awakening, movement during night, very restless night,  REVIEW OF SYSTEMS:  Full 14 system review of systems performed and notable only for as above All other review of systems were negative.   PHYSICAL EXAM   Vitals:   02/21/21 1536  BP: 120/67  Pulse: 73  Weight: 264 lb 8 oz (120 kg)  Height: 6' (1.829 m)   Not recorded     Body mass index is 35.87 kg/m.  PHYSICAL EXAMNIATION:  Gen: NAD, conversant, well nourised, well groomed          NEUROLOGICAL EXAM:  MENTAL STATUS: mild slow reaction time  [facility] Cognitive Assessment  06/27/2021 02/21/2021  Visuospatial/ Executive (0/5) 5 3  Naming (0/3) 3 3  Attention: Read list of digits (0/2) 2 2  Attention: Read list of letters (0/1) 1 1  Attention: Serial 7 subtraction starting at 100 (0/3) 3 1  Language: Repeat phrase (0/2) 2 2  Language : Fluency (0/1) 1 1  Abstraction (0/2) 2 2  Delayed Recall (0/5) 4 3  Orientation (0/6) 6 6  Total 29 24  Adjusted Score (based on education) 29 -   Montreal Cognitive Assessment  06/27/2021 02/21/2021  Visuospatial/ Executive (  0/5) 5 3  Naming (0/3) 3 3  Attention: Read list of digits (0/2) 2 2  Attention: Read list of letters (0/1) 1 1  Attention: Serial 7 subtraction starting at 100 (0/3) 3 1  Language: Repeat phrase (0/2) 2 2  Language : Fluency (0/1) 1 1  Abstraction (0/2) 2 2  Delayed Recall (0/5) 4 3  Orientation (0/6) 6 6  Total 29 24  Adjusted Score (based on education) 29 -      CRANIAL NERVES: CN II: Visual fields are full to confrontation. Pupils are round equal and briskly reactive to light. CN III, IV, VI: extraocular movement are normal. No ptosis. CN V: Facial sensation is intact to light touch CN  VII: Face is symmetric with normal eye closure  CN VIII: Hearing is normal to causal conversation. CN IX, X: Phonation is normal. CN XI: Head turning and shoulder shrug are intact  MOTOR: Left hand resting tremor, mild left more than right rigidity, bradykinesia, there was no significant bilateral upper or lower extremity weakness,   REFLEXES: Reflexes are 1 and symmetric at the biceps, triceps, knees, and ankles. Plantar responses are flexor.  SENSORY: Intact to light touch, pinprick    COORDINATION: There is no trunk or limb dysmetria noted.  GAIT/STANCE: Need to push up to get up from seated position, decreased swing of left arm, moderate stride, tends to drag left foot across the floor, with tendency for left foot ankle plantarflexion,  ALLERGIES: No Known Allergies  HOME MEDICATIONS: Current Outpatient Medications  Medication Sig Dispense Refill   amLODipine (NORVASC) 10 MG tablet Take 10 mg by mouth daily.     apixaban (ELIQUIS) 5 MG TABS tablet Take 1 tablet (5 mg total) by mouth 2 (two) times daily. 60 tablet 3   diltiazem (CARDIZEM) 30 MG tablet TAKE 1 TABLET EVERY 4 HOURS AS NEEDED FOR AFIB HEART RATE >100 45 tablet 1   doxazosin (CARDURA) 4 MG tablet Take 4 mg by mouth 2 (two) times daily.     DULoxetine (CYMBALTA) 60 MG capsule TAKE 1 CAPSULE BY MOUTH EVERY DAY 90 capsule 3   glimepiride (AMARYL) 4 MG tablet Take 4 mg by mouth 2 (two) times daily.     hydrocortisone 2.5 % cream Apply topically.     losartan (COZAAR) 100 MG tablet Take 100 mg by mouth daily.     losartan-hydrochlorothiazide (HYZAAR) 100-25 MG per tablet Take 1 tablet by mouth daily.     metFORMIN (GLUCOPHAGE) 500 MG tablet Take 500 mg by mouth 2 (two) times daily with a meal.     metoprolol succinate (TOPROL-XL) 100 MG 24 hr tablet Take 1 tablet (100 mg total) by mouth daily. 30 tablet 0   ONETOUCH ULTRA test strip USE TO TEST BLOOD SUGAR TWICE DAILY     rasagiline (AZILECT) 1 MG TABS tablet TAKE 1  TABLET BY MOUTH EVERY DAY 90 tablet 4   simvastatin (ZOCOR) 20 MG tablet Take 20 mg by mouth daily.      traMADol (ULTRAM) 50 MG tablet Take 50 mg by mouth as needed.     No current facility-administered medications for this visit.    PAST MEDICAL HISTORY: Past Medical History:  Diagnosis Date   Abnormal cardiac CT angiography 2006   only abnormal with soft plaque in LAD calcium score is0    Abnormal LFTs    Balance problem    CAD (coronary artery disease)    a. has seen Dr. Riley KillStuckey in the past and  had cardiac CT 2006 with calcium score of 0. He had soft plaque in LAD of about 50%.  He had a normal stress echo in 2012. He also had a normal nuc in 2016.   Chest discomfort    Diabetes mellitus    Forgetfulness    Hyperlipidemia    Hypertension    Obesity    OSA (obstructive sleep apnea)    Paroxysmal atrial fibrillation (HCC)    Tremor     PAST SURGICAL HISTORY: Past Surgical History:  Procedure Laterality Date   ROTATOR CUFF REPAIR  2009   Right   TRIGGER FINGER RELEASE  02/12/2012   Procedure: MINOR RELEASE TRIGGER FINGER/A-1 PULLEY;  Surgeon: Wyn Forster., MD;  Location: [facility];  Service: Orthopedics;  Laterality: Right;  right thumb    FAMILY HISTORY: Family History  Problem Relation Age of Onset   Hypertension Mother    Hypertension Father    Stroke Father    Sudden death Sister    Hypertension Sister    Alcohol abuse Brother    Heart attack Neg Hx     SOCIAL HISTORY: Social History   Socioeconomic History   Marital status: Married    Spouse name: Not on file   Number of children: 9   Years of education: two years of college   Highest education level: Not on file  Occupational History   Occupation: Retired     Associate Professor: [employer]  Tobacco Use   Smoking status: Never   Smokeless tobacco: Never  Vaping Use   Vaping Use: Never used  Substance and Sexual Activity   Alcohol use: No   Drug use: No   Sexual activity: Not on file   Other Topics Concern   Not on file  Social History Narrative   Lives at home with his wife.   Ambidextrous.   No daily use of caffeine, occasional soda.   Social Determinants of Health   Financial Resource Strain: Not on file  Food Insecurity: Not on file  Transportation Needs: Not on file  Physical Activity: Not on file  Stress: Not on file  Social Connections: Not on file  Intimate Partner Violence: Not on file         Levert Feinstein, M.D. Ph.D.  [facility] [address] [place], [place] [address] Ph: [phone] Fax: [phone]  CC:  Charlane Ferretti, DO [address] [place],  [place] [address]

## 2021-06-28 ENCOUNTER — Encounter: Payer: Self-pay | Admitting: Podiatry

## 2021-06-28 ENCOUNTER — Ambulatory Visit (INDEPENDENT_AMBULATORY_CARE_PROVIDER_SITE_OTHER): Payer: 59 | Admitting: Podiatry

## 2021-06-28 DIAGNOSIS — E1140 Type 2 diabetes mellitus with diabetic neuropathy, unspecified: Secondary | ICD-10-CM | POA: Diagnosis not present

## 2021-06-28 DIAGNOSIS — B351 Tinea unguium: Secondary | ICD-10-CM | POA: Diagnosis not present

## 2021-06-28 DIAGNOSIS — D689 Coagulation defect, unspecified: Secondary | ICD-10-CM | POA: Diagnosis not present

## 2021-06-28 DIAGNOSIS — M79674 Pain in right toe(s): Secondary | ICD-10-CM

## 2021-06-28 DIAGNOSIS — E1149 Type 2 diabetes mellitus with other diabetic neurological complication: Secondary | ICD-10-CM

## 2021-06-28 DIAGNOSIS — M79675 Pain in left toe(s): Secondary | ICD-10-CM | POA: Diagnosis not present

## 2021-06-28 NOTE — Progress Notes (Signed)
This patient returns to my office for at risk foot care.  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  This patient is unable to cut nails himself since the patient cannot reach his nails.These nails are painful walking and wearing shoes.  This patient presents for at risk foot care today.  General Appearance  Alert, conversant and in no acute stress.  Vascular  Dorsalis pedis and posterior tibial  pulses are palpable  bilaterally.  Capillary return is within normal limits  bilaterally. Temperature is within normal limits  bilaterally.  Neurologic  Senn-Weinstein monofilament wire test within normal limits  bilaterally. Muscle power within normal limits bilaterally.  Nails Thick disfigured discolored nails with subungual debris  from hallux to fifth toes bilaterally. No evidence of bacterial infection or drainage bilaterally.  Orthopedic  No limitations of motion  feet .  No crepitus or effusions noted.  No bony pathology or digital deformities noted. Mild HAV  B/L.  ADV 5th digit  B/L.  Skin  normotropic skin with no porokeratosis noted bilaterally.  No signs of infections or ulcers noted.     Onychomycosis  Pain in right toes  Pain in left toes  Consent was obtained for treatment procedures.   Mechanical debridement of nails 1-5  bilaterally performed with a nail nipper.  Filed with dremel without incident.    Return office visit     3 months                 Told patient to return for periodic foot care and evaluation due to potential at risk complications.     DPM  

## 2021-09-08 ENCOUNTER — Ambulatory Visit (INDEPENDENT_AMBULATORY_CARE_PROVIDER_SITE_OTHER): Payer: 59 | Admitting: Podiatry

## 2021-09-08 ENCOUNTER — Encounter: Payer: Self-pay | Admitting: Podiatry

## 2021-09-08 ENCOUNTER — Other Ambulatory Visit: Payer: Self-pay

## 2021-09-08 DIAGNOSIS — E1140 Type 2 diabetes mellitus with diabetic neuropathy, unspecified: Secondary | ICD-10-CM | POA: Diagnosis not present

## 2021-09-08 DIAGNOSIS — B351 Tinea unguium: Secondary | ICD-10-CM | POA: Diagnosis not present

## 2021-09-08 DIAGNOSIS — M79675 Pain in left toe(s): Secondary | ICD-10-CM

## 2021-09-08 DIAGNOSIS — D689 Coagulation defect, unspecified: Secondary | ICD-10-CM

## 2021-09-08 DIAGNOSIS — M79674 Pain in right toe(s): Secondary | ICD-10-CM

## 2021-09-08 DIAGNOSIS — E1149 Type 2 diabetes mellitus with other diabetic neurological complication: Secondary | ICD-10-CM

## 2021-09-08 NOTE — Progress Notes (Signed)
This patient returns to my office for at risk foot care.  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  This patient is unable to cut nails himself since the patient cannot reach his nails.These nails are painful walking and wearing shoes.  This patient presents for at risk foot care today.  General Appearance  Alert, conversant and in no acute stress.  Vascular  Dorsalis pedis and posterior tibial  pulses are palpable  bilaterally.  Capillary return is within normal limits  bilaterally. Temperature is within normal limits  bilaterally.  Neurologic  Senn-Weinstein monofilament wire test within normal limits  bilaterally. Muscle power within normal limits bilaterally.  Nails Thick disfigured discolored nails with subungual debris  from hallux to fifth toes bilaterally. No evidence of bacterial infection or drainage bilaterally.  Orthopedic  No limitations of motion  feet .  No crepitus or effusions noted.  No bony pathology or digital deformities noted. Mild HAV  B/L.  ADV 5th digit  B/L.  Skin  normotropic skin with no porokeratosis noted bilaterally.  No signs of infections or ulcers noted.     Onychomycosis  Pain in right toes  Pain in left toes  Consent was obtained for treatment procedures.   Mechanical debridement of nails 1-5  bilaterally performed with a nail nipper.  Filed with dremel without incident.    Return office visit     3 months                 Told patient to return for periodic foot care and evaluation due to potential at risk complications.     DPM  

## 2021-09-15 ENCOUNTER — Telehealth: Payer: Self-pay | Admitting: Neurology

## 2021-09-15 MED ORDER — ROPINIROLE HCL ER 2 MG PO TB24: 4.0000 mg | ORAL_TABLET | Freq: Every day | ORAL | 3 refills | Status: DC

## 2021-09-15 MED ORDER — DONEPEZIL HCL 10 MG PO TABS: 10.0000 mg | ORAL_TABLET | Freq: Every day | ORAL | 3 refills | Status: DC

## 2021-09-15 NOTE — Telephone Encounter (Signed)
Refills sent to mail order pharmacy.

## 2021-09-15 NOTE — Telephone Encounter (Signed)
[facility]  90 day Rx for donepezil (ARICEPT) 10 MG tablet, rOPINIRole (REQUIP XL) 2 MG 24 hr tablet Ph [phone] fax [phone]

## 2021-11-17 ENCOUNTER — Other Ambulatory Visit: Payer: Self-pay

## 2021-11-17 ENCOUNTER — Ambulatory Visit (INDEPENDENT_AMBULATORY_CARE_PROVIDER_SITE_OTHER): Payer: 59 | Admitting: Podiatry

## 2021-11-17 ENCOUNTER — Encounter: Payer: Self-pay | Admitting: Podiatry

## 2021-11-17 DIAGNOSIS — M79674 Pain in right toe(s): Secondary | ICD-10-CM | POA: Diagnosis not present

## 2021-11-17 DIAGNOSIS — B351 Tinea unguium: Secondary | ICD-10-CM

## 2021-11-17 DIAGNOSIS — M79675 Pain in left toe(s): Secondary | ICD-10-CM

## 2021-11-17 DIAGNOSIS — E1149 Type 2 diabetes mellitus with other diabetic neurological complication: Secondary | ICD-10-CM

## 2021-11-17 DIAGNOSIS — D689 Coagulation defect, unspecified: Secondary | ICD-10-CM

## 2021-11-17 DIAGNOSIS — E1140 Type 2 diabetes mellitus with diabetic neuropathy, unspecified: Secondary | ICD-10-CM | POA: Diagnosis not present

## 2021-11-17 NOTE — Progress Notes (Signed)
This patient returns to my office for at risk foot care.  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  This patient is unable to cut nails himself since the patient cannot reach his nails.These nails are painful walking and wearing shoes.  This patient presents for at risk foot care today.  General Appearance  Alert, conversant and in no acute stress.  Vascular  Dorsalis pedis and posterior tibial  pulses are palpable  bilaterally.  Capillary return is within normal limits  bilaterally. Temperature is within normal limits  bilaterally.  Neurologic  Senn-Weinstein monofilament wire test within normal limits  bilaterally. Muscle power within normal limits bilaterally.  Nails Thick disfigured discolored nails with subungual debris  from hallux to fifth toes bilaterally. No evidence of bacterial infection or drainage bilaterally.  Orthopedic  No limitations of motion  feet .  No crepitus or effusions noted.  No bony pathology or digital deformities noted. Mild HAV  B/L.  ADV 5th digit  B/L.  Skin  normotropic skin with no porokeratosis noted bilaterally.  No signs of infections or ulcers noted.     Onychomycosis  Pain in right toes  Pain in left toes  Consent was obtained for treatment procedures.   Mechanical debridement of nails 1-5  bilaterally performed with a nail nipper.  Filed with dremel without incident.    Return office visit     3 months                 Told patient to return for periodic foot care and evaluation due to potential at risk complications.     DPM  

## 2021-12-28 ENCOUNTER — Ambulatory Visit (INDEPENDENT_AMBULATORY_CARE_PROVIDER_SITE_OTHER): Payer: 59 | Admitting: Adult Health

## 2021-12-28 ENCOUNTER — Ambulatory Visit: Payer: 59 | Admitting: Internal Medicine

## 2021-12-28 ENCOUNTER — Other Ambulatory Visit: Payer: Self-pay

## 2021-12-28 ENCOUNTER — Encounter: Payer: Self-pay | Admitting: Adult Health

## 2021-12-28 DIAGNOSIS — G4733 Obstructive sleep apnea (adult) (pediatric): Secondary | ICD-10-CM

## 2021-12-28 NOTE — Assessment & Plan Note (Addendum)
Patient education given  - discussed how weight can impact sleep and risk for sleep disordered breathing - discussed options to assist with weight loss: combination of diet modification, cardiovascular and strength training exercises   - had an extensive discussion regarding the adverse health consequences related to untreated sleep disordered breathing - specifically discussed the risks for hypertension, coronary artery disease, cardiac dysrhythmias, cerebrovascular disease, and diabetes - lifestyle modification discussed   - discussed how sleep disruption can increase risk of accidents, particularly when driving - safe driving practices were discussed  Restart CPAP.  Mask fitting order to DME.  We will decrease pressure for comfort.    Plan  Patient Instructions  Mask fitting at DME .  Need to restart CPAP at bedtime Try to wear all night long.  Work on healthy weight loss Do not drive if sleepy  Follow-up in 3 months with Dr. Maple Hudson and as needed

## 2021-12-28 NOTE — Patient Instructions (Addendum)
Mask fitting at DME .  Decrease CPAP pressure to 5 to 12cm H2O.  Need to restart CPAP at bedtime Try to wear all night long.  Work on healthy weight loss Do not drive if sleepy  Follow-up in 3 months with Dr. Maple Hudson and as needed

## 2021-12-28 NOTE — Progress Notes (Signed)
@Patient  ID: Edwin Gallagher, male    DOB: 02/11/1956, 66 y.o.   MRN: [id number]  Chief Complaint  Patient presents with   Follow-up    Referring provider: Sueanne Margarita, DO  HPI: 66 year old male never smoker followed for obstructive sleep apnea Medical history significant for atrial fibrillation on Eliquis  TEST/EVENTS :  NPSG 2012 AHI 85/ hr, CPAP titrated to 14 HST-09/24/17-AHI 20.9/hour, desaturation to 84%, body weight 289 pounds CPAP titration study 07/14/20- 14 cwp, + RBS with screaming, raising arms during REM  12/28/2021 Follow up : OSA  Patient presents for a 1 year follow-up.  Patient has underlying sleep apnea is on nocturnal CPAP.  Says he has not been able to wear for last 6 months. Says the headgear was causing pressure on eyes . When stopped CPAP, eye problem resolved. Wants to see what options he has. Has sleepiness and restless sleep and snoring . We discussed options for sleep apnea treatment.,     No Known Allergies  Immunization History  Administered Date(s) Administered   Influenza Inj Mdck Quad Pf 07/21/2018   Influenza Split 06/25/2017, 08/12/2020   Influenza,inj,Quad PF,6+ Mos 06/19/2017, 06/25/2017, 08/11/2021   PFIZER(Purple Top)SARS-COV-2 Vaccination 03/03/2020, 03/18/2020, 11/02/2020    Past Medical History:  Diagnosis Date   Abnormal cardiac CT angiography 2006   only abnormal with soft plaque in LAD calcium score is0    Abnormal LFTs    Balance problem    CAD (coronary artery disease)    a. has seen Dr. Lia Foyer in the past and had cardiac CT 2006 with calcium score of 0. He had soft plaque in LAD of about 50%.  He had a normal stress echo in 2012. He also had a normal nuc in 2016.   Chest discomfort    Diabetes mellitus    Forgetfulness    Hyperlipidemia    Hypertension    Obesity    OSA (obstructive sleep apnea)    Paroxysmal atrial fibrillation (HCC)    Tremor     Tobacco History: Social History   Tobacco Use  Smoking Status Never   Smokeless Tobacco Never   Counseling given: Not Answered   Outpatient Medications Prior to Visit  Medication Sig Dispense Refill   amLODipine (NORVASC) 10 MG tablet Take 10 mg by mouth daily.     apixaban (ELIQUIS) 5 MG TABS tablet Take 1 tablet (5 mg total) by mouth 2 (two) times daily. 60 tablet 3   diltiazem (CARDIZEM) 30 MG tablet TAKE 1 TABLET EVERY 4 HOURS AS NEEDED FOR AFIB HEART RATE >100 45 tablet 1   donepezil (ARICEPT) 10 MG tablet Take 1 tablet (10 mg total) by mouth at bedtime. 90 tablet 3   doxazosin (CARDURA) 4 MG tablet Take 4 mg by mouth daily.     glimepiride (AMARYL) 4 MG tablet Take 4 mg by mouth daily.     hydrocortisone 2.5 % cream Apply topically.     losartan-hydrochlorothiazide (HYZAAR) 100-25 MG tablet Take 1 tablet by mouth daily. 1/2 tablet daily     metFORMIN (GLUCOPHAGE) 500 MG tablet Take 500 mg by mouth daily.     metoprolol succinate (TOPROL-XL) 100 MG 24 hr tablet Take 1 tablet (100 mg total) by mouth daily. 30 tablet 0   ONETOUCH ULTRA test strip USE TO TEST BLOOD SUGAR TWICE DAILY     simvastatin (ZOCOR) 20 MG tablet Take 20 mg by mouth daily.      traMADol (ULTRAM) 50 MG tablet Take 50 mg by  mouth as needed.     DULoxetine (CYMBALTA) 60 MG capsule TAKE 1 CAPSULE BY MOUTH EVERY DAY (Patient not taking: Reported on 12/28/2021) 90 capsule 3   losartan (COZAAR) 100 MG tablet Take 50 mg by mouth daily. 1/2 of 100mg  (Patient not taking: Reported on 12/28/2021)     losartan-hydrochlorothiazide (HYZAAR) 100-25 MG per tablet Take 1 tablet by mouth daily. 1/2 tab (Patient not taking: Reported on 12/28/2021)     rasagiline (AZILECT) 1 MG TABS tablet TAKE 1 TABLET BY MOUTH EVERY DAY (Patient not taking: Reported on 12/28/2021) 90 tablet 4   rOPINIRole (REQUIP XL) 2 MG 24 hr tablet Take 2 tablets (4 mg total) by mouth at bedtime. (Patient not taking: Reported on 12/28/2021) 180 tablet 3   No facility-administered medications prior to visit.     Review of Systems:    Constitutional:   No  weight loss, night sweats,  Fevers, chills,  +fatigue, or  lassitude.  HEENT:   No headaches,  Difficulty swallowing,  Tooth/dental problems, or  Sore throat,                No sneezing, itching, ear ache, nasal congestion, post nasal drip,   CV:  No chest pain,  Orthopnea, PND, swelling in lower extremities, anasarca, dizziness, palpitations, syncope.   GI  No heartburn, indigestion, abdominal pain, nausea, vomiting, diarrhea, change in bowel habits, loss of appetite, bloody stools.   Resp: No shortness of breath with exertion or at rest.  No excess mucus, no productive cough,  No non-productive cough,  No coughing up of blood.  No change in color of mucus.  No wheezing.  No chest wall deformity  Skin: no rash or lesions.  GU: no dysuria, change in color of urine, no urgency or frequency.  No flank pain, no hematuria   MS:  No joint pain or swelling.  No decreased range of motion.  No back pain.    Physical Exam  BP 120/60 (BP Location: Right Arm, Patient Position: Sitting, Cuff Size: Large)    Pulse 85    Temp 99 F (37.2 C) (Oral)    Ht 6' (1.829 m)    Wt 272 lb (123.4 kg)    SpO2 95%    BMI 36.89 kg/m   GEN: A/Ox3; pleasant , NAD, well nourished    HEENT:  [place]/AT,   NOSE-clear, THROAT-clear, no lesions, no postnasal drip or exudate noted.  Class 3 MP airway   NECK:  Supple w/ fair ROM; no JVD; normal carotid impulses w/o bruits; no thyromegaly or nodules palpated; no lymphadenopathy.    RESP  Clear  P & A; w/o, wheezes/ rales/ or rhonchi. no accessory muscle use, no dullness to percussion  CARD:  RRR, no m/r/g, tr  peripheral edema, pulses intact, no cyanosis or clubbing.  GI:   Soft & nt; nml bowel sounds; no organomegaly or masses detected.   Musco: Warm bil, no deformities or joint swelling noted.   Neuro: alert, no focal deficits noted.    Skin: Warm, no lesions or rashes    Lab Results:  CBC   BMET   BNP No results found for:  BNP  ProBNP No results found for: PROBNP  Imaging: No results found.    No flowsheet data found.  No results found for: NITRICOXIDE      Assessment & Plan:   OSA (obstructive sleep apnea) Patient education given  - discussed how weight can impact sleep and risk for sleep disordered  breathing - discussed options to assist with weight loss: combination of diet modification, cardiovascular and strength training exercises   - had an extensive discussion regarding the adverse health consequences related to untreated sleep disordered breathing - specifically discussed the risks for hypertension, coronary artery disease, cardiac dysrhythmias, cerebrovascular disease, and diabetes - lifestyle modification discussed   - discussed how sleep disruption can increase risk of accidents, particularly when driving - safe driving practices were discussed  Restart CPAP.  Mask fitting order to DME.  We will decrease pressure for comfort.    Plan  Patient Instructions  Mask fitting at DME .  Need to restart CPAP at bedtime Try to wear all night long.  Work on healthy weight loss Do not drive if sleepy  Follow-up in 3 months with Dr. Annamaria Boots and as needed       Rexene Edison, NP 12/28/2021

## 2022-01-02 ENCOUNTER — Ambulatory Visit: Payer: 59 | Admitting: Neurology

## 2022-01-18 ENCOUNTER — Ambulatory Visit (INDEPENDENT_AMBULATORY_CARE_PROVIDER_SITE_OTHER): Payer: 59 | Admitting: Neurology

## 2022-01-18 ENCOUNTER — Encounter: Payer: Self-pay | Admitting: Neurology

## 2022-01-18 ENCOUNTER — Other Ambulatory Visit: Payer: Self-pay

## 2022-01-18 VITALS — BP 117/73 | HR 76 | Ht 72.0 in | Wt 270.5 lb

## 2022-01-18 DIAGNOSIS — R269 Unspecified abnormalities of gait and mobility: Secondary | ICD-10-CM

## 2022-01-18 DIAGNOSIS — G3184 Mild cognitive impairment, so stated: Secondary | ICD-10-CM | POA: Diagnosis not present

## 2022-01-18 DIAGNOSIS — G20 Parkinson's disease: Secondary | ICD-10-CM

## 2022-01-18 MED ORDER — NEUPRO 2 MG/24HR TD PT24: 1.0000 | MEDICATED_PATCH | Freq: Every day | TRANSDERMAL | 12 refills | Status: DC

## 2022-01-18 NOTE — Progress Notes (Signed)
ASSESSMENT AND PLAN  Edwin Gallagher is a 66 y.o. male   Parkinsonism  Doubt it is typical Parkinson's disease, parkinsonian features mainly affecting left side, but not responsive to Sinemet 25/100mg  tid, no longer on it consistent with central nervous system degenerative disorder, Parkinson plus syndrome,  Mild cognitive impairment MoCA examination 26/30 in March 2023, Laboratory evaluation to rule out treatable etiology  REM sleep disorder  He has mild parkinsonian features on examination, involving left upper and lower extremity more than right, also complains of REM sleep disorder,  MRI of the brain showed moderate supratentorium small vessel disease,   DaTSCAN showed asymmetry, decreased radiotracer activity within the left and right stratum, with greater deficit on the right  Consistent with central nervous system degenerative disorder previously tried Sinemet up to 25/100mg  3 times a day, Azilect, Requip XR, he could not tolerate it, or did not have any noticeable improvement, no longer taking it,   Aricept 10 mg every night plus melatonin every night does help him sleep better,  Will try Neupro patch 2 mg every day  Chronic low back pain  EMG nerve conduction study showed no large fiber peripheral neuropathy or bilateral lumbosacral radiculopathy  MRI of lumbar spine showed multilevel degenerative changes most noticeable at L4-5 with moderate foraminal narrowing no canal stenosis,  Referred him to physical therapy, encouraging moderate exercise, water aerobic    DIAGNOSTIC DATA (LABS, IMAGING, TESTING) - I reviewed patient records, labs, notes, testing and imaging myself where available.   DaTscan February 16 2021:  Asymmetric decreased radiotracer activity within the LEFT and RIGHT striatum with greater deficit on the RIGHT. This pattern has been associated with Parkinsonian syndrome pathologies.   Of note, DaTSCAN is not diagnostic of Parkinsonian syndromes, which remains a  clinical diagnosis. DaTscan is an adjuvant test to aid in the clinical diagnosis of Parkinsonian syndromes.  MRI lumbar spine Jan 01 2021: Lumbar spine degeneration which is most notable at L4-5 where there is advanced facet osteoarthritis with anterolisthesis and moderate bilateral foraminal impingement. Subarticular recess narrowing on the left more than right at the same level.    MRI of the brain without contrast shows the following: 1.   Multiple T2/FLAIR hyperintense foci in the subcortical and deep white matter of the hemispheres.  None of these appear to be acute.  This is most consistent with moderate chronic microvascular ischemic change.  Demyelination would be much less likely. 2.   No acute findings.    HISTORICAL  Edwin Gallagher is a 66 year old male, seen in request by his primary care physician Dr. Charlane Ferretti for evaluation of balance concerns, sleep issues, forgetfulness, initial evaluation was on November 03, 2020  I reviewed and summarized the referring note. PMHX. HTN HLD DM Atrial fibrillation, on Eliquis,  He retired from [employer] in 2017, since then, he become less active, still drive his children to school, doing some yard work, over the past 4 years, he noticed decreased stamina, decreased energy, mild balance issues,  He used to work heavy labor at [employer], he denies family history of memory loss,  He was noted by his family that he moves a lot during sleep, talking out of dreams,  He also complains of left shoulder pain, he has to focus on lifting his left leg while ambulating, more noticeable walking longer distances, he also have left hand resting tremor  He denied loss of sense of smell, no constipations,  Laboratory evaluation in October 2021, normal CMP, potassium of 3.2,  CBC, hemoglobin of 13.3, B12 294, LDL 108 TSH 1.28,  UPDATE Jan 05 2021: He is alone at today's visit, complains of worsening low back pain, increased gait abnormality, tends to trip his  left foot across the floor,  We personally reviewed MRI of the brain in January 2022, moderate supratentorium small vessel disease, no acute abnormality,  He was noted to have left more than right rigidity, bradykinesia,   We also personally reviewed MRI of lumbar spine Lumbar spine degeneration which is most notable at L4-5 where there is advanced facet osteoarthritis with anterolisthesis and moderate bilateral foraminal impingement. Subarticular recess narrowing on the left more than right at the same level.  Update January 23, 2021: He return for electrodiagnostic study today, which showed no significant abnormality, in specific, no evidence of large fiber peripheral neuropathy, or bilateral lumbosacral radiculopathy  He continued complaints of low back pain, gait abnormality  He was noted to have mild parkinsonian features, DaTSCAN is pending, slight improvement with Sinemet/25/100 mg 3 times daily  UPDATE February 21 2021: He is accompanied by his wife at today's clinical visit, we went over all his imaging study, confirmed his medical history, excessive movement during sleep, frequent awakening at nighttime, obstructive sleep apnea, improved with CPAP machine,  He tried Sinemet 25/100 mg 3 times daily, initially reported moderate improvement, now was not sure about the benefit, he does take 3 times a day, but sometimes not on schedule,  His low back pain has improved, still dragging his left leg across the floor some, DaTSCAN in April 2022 showed asymmetry decreased radiotracer activity was in the left and right stratum, with greater deficit on the right this pattern associated with parkinsonian syndrome pathology  UPDATE June 27 2021: He is accompanied by his wife at today's clinical visit, he stopped his Sinemet 25/100 3 times daily, did not notice any difference taking medications, continue has mild gait difficulty, dragging left leg, left hand tremor, mild mental slowing, described  brain freeze movement, long string of the thoughts, when he gets to a different room, sometimes he forgot why he was there, today's MoCA examination 29/30, he is pastoring a local church  He has obstructive sleep apnea, tolerating CPAP, no longer snoring, but he has frequent awakening, movement during night, very restless night,  UPDATE January 18 2022: Accompanied by his wife at today's visit, continue complains of slow decline over time, he works as an Teaching laboratory technician for [employer], also busy taking care of her children at home, he had obstructive sleep apnea, seeing Dr. Maple Hudson,  Over the past few months, he reported slow decline, worsening gait abnormality, dragging left foot across the floor, he has a appointment pending with [facility] soon,  He also complains of worsening low back pain, known history of lumbar degenerative changes. He also has mild memory loss MoCA examination 26/30 today  REVIEW OF SYSTEMS:  Full 14 system review of systems performed and notable only for as above All other review of systems were negative.   PHYSICAL EXAM   Vitals:   02/21/21 1536  BP: 120/67  Pulse: 73  Weight: 264 lb 8 oz (120 kg)  Height: 6' (1.829 m)   Not recorded     Body mass index is 35.87 kg/m.  PHYSICAL EXAMNIATION:  Gen: NAD, conversant, well nourised, well groomed          NEUROLOGICAL EXAM:  MENTAL STATUS: mild slow reaction time  [facility] Cognitive Assessment  01/18/2022 06/27/2021 02/21/2021  Visuospatial/ Executive (  0/5) 4 5 3   Naming (0/3) 3 3 3   Attention: Read list of digits (0/2) 2 2 2   Attention: Read list of letters (0/1) 1 1 1   Attention: Serial 7 subtraction starting at 100 (0/3) 3 3 1   Language: Repeat phrase (0/2) 2 2 2   Language : Fluency (0/1) 1 1 1   Abstraction (0/2) 2 2 2   Delayed Recall (0/5) 3 4 3   Orientation (0/6) 5 6 6   Total 26 29 24   Adjusted Score (based on education) 26 29 -      CRANIAL NERVES: CN II: Visual fields are full to  confrontation. Pupils are round equal and briskly reactive to light. CN III, IV, VI: extraocular movement are normal. No ptosis. CN V: Facial sensation is intact to light touch CN VII: Face is symmetric with normal eye closure  CN VIII: Hearing is normal to causal conversation. CN IX, X: Phonation is normal. CN XI: Head turning and shoulder shrug are intact  MOTOR: Left hand resting tremor, mild left more than right rigidity, bradykinesia, there was no significant bilateral upper or lower extremity weakness,   REFLEXES: Reflexes are 1 and symmetric at the biceps, triceps, knees, and ankles. Plantar responses are flexor.  SENSORY: Intact to light touch, pinprick    COORDINATION: There is no trunk or limb dysmetria noted.  GAIT/STANCE: Need to push up to get up from seated position, decreased swing of left arm, moderate stride, tends to drag left foot across the floor, with tendency for left foot ankle plantarflexion,  ALLERGIES: No Known Allergies  HOME MEDICATIONS: Current Outpatient Medications  Medication Sig Dispense Refill   amLODipine (NORVASC) 10 MG tablet Take 10 mg by mouth daily.     apixaban (ELIQUIS) 5 MG TABS tablet Take 1 tablet (5 mg total) by mouth 2 (two) times daily. 60 tablet 3   diltiazem (CARDIZEM) 30 MG tablet TAKE 1 TABLET EVERY 4 HOURS AS NEEDED FOR AFIB HEART RATE >100 45 tablet 1   donepezil (ARICEPT) 10 MG tablet Take 1 tablet (10 mg total) by mouth at bedtime. 90 tablet 3   doxazosin (CARDURA) 4 MG tablet Take 4 mg by mouth daily.     glimepiride (AMARYL) 4 MG tablet Take 4 mg by mouth daily.     hydrocortisone 2.5 % cream Apply topically.     losartan-hydrochlorothiazide (HYZAAR) 100-25 MG tablet Take 1 tablet by mouth daily. 1/2 tablet daily     metFORMIN (GLUCOPHAGE) 500 MG tablet Take 500 mg by mouth daily.     metoprolol succinate (TOPROL-XL) 100 MG 24 hr tablet Take 1 tablet (100 mg total) by mouth daily. 30 tablet 0   ONETOUCH ULTRA test strip  USE TO TEST BLOOD SUGAR TWICE DAILY     simvastatin (ZOCOR) 20 MG tablet Take 20 mg by mouth daily.      traMADol (ULTRAM) 50 MG tablet Take 50 mg by mouth as needed.     No current facility-administered medications for this visit.    PAST MEDICAL HISTORY: Past Medical History:  Diagnosis Date   Abnormal cardiac CT angiography 2006   only abnormal with soft plaque in LAD calcium score is0    Abnormal LFTs    Balance problem    CAD (coronary artery disease)    a. has seen Dr. in the past and had cardiac CT 2006 with calcium score of 0. He had soft plaque in LAD of about 50%.  He had a normal stress echo in 2012. He  also had a normal nuc in 2016.   Chest discomfort    Diabetes mellitus    Forgetfulness    Hyperlipidemia    Hypertension    Obesity    OSA (obstructive sleep apnea)    Paroxysmal atrial fibrillation (HCC)    Tremor     PAST SURGICAL HISTORY: Past Surgical History:  Procedure Laterality Date   ROTATOR CUFF REPAIR  2009   Right   TRIGGER FINGER RELEASE  02/12/2012   Procedure: MINOR RELEASE TRIGGER FINGER/A-1 PULLEY;  Surgeon: Wyn Forsterobert V Sypher Jr., MD;  Location: [facility];  Service: Orthopedics;  Laterality: Right;  right thumb    FAMILY HISTORY: Family History  Problem Relation Age of Onset   Hypertension Mother    Hypertension Father    Stroke Father    Sudden death Sister    Hypertension Sister    Alcohol abuse Brother    Heart attack Neg Hx     SOCIAL HISTORY: Social History   Socioeconomic History   Marital status: Married    Spouse name: Not on file   Number of children: 9   Years of education: two years of college   Highest education level: Not on file  Occupational History   Occupation: Retired     Associate Professormployer: [employer]  Tobacco Use   Smoking status: Never   Smokeless tobacco: Never  Vaping Use   Vaping Use: Never used  Substance and Sexual Activity   Alcohol use: No   Drug use: No   Sexual activity: Not on file   Other Topics Concern   Not on file  Social History Narrative   Lives at home with his wife.   Ambidextrous.   No daily use of caffeine, occasional soda.   Social Determinants of Health   Financial Resource Strain: Not on file  Food Insecurity: Not on file  Transportation Needs: Not on file  Physical Activity: Not on file  Stress: Not on file  Social Connections: Not on file  Intimate Partner Violence: Not on file         Levert FeinsteinYijun , M.D. Ph.D.  [facility] [address], [address] [place], [place] [address] Ph: [phone] Fax: [phone]  CC:  Charlane FerrettiSkakle, [place], DO [address] [place] [id number]

## 2022-01-21 LAB — COMPREHENSIVE METABOLIC PANEL
ALT: 22 IU/L (ref 0–44)
AST: 30 IU/L (ref 0–40)
Albumin/Globulin Ratio: 1.3 (ref 1.2–2.2)
Albumin: 4.4 g/dL (ref 3.8–4.8)
Alkaline Phosphatase: 105 IU/L (ref 44–121)
BUN/Creatinine Ratio: 13 (ref 10–24)
BUN: 22 mg/dL (ref 8–27)
Bilirubin Total: 0.6 mg/dL (ref 0.0–1.2)
CO2: 24 mmol/L (ref 20–29)
Calcium: 9.6 mg/dL (ref 8.6–10.2)
Chloride: 104 mmol/L (ref 96–106)
Creatinine, Ser: 1.64 mg/dL — ABNORMAL HIGH (ref 0.76–1.27)
Globulin, Total: 3.3 g/dL (ref 1.5–4.5)
Glucose: 144 mg/dL — ABNORMAL HIGH (ref 70–99)
Potassium: 3.3 mmol/L — ABNORMAL LOW (ref 3.5–5.2)
Sodium: 143 mmol/L (ref 134–144)
Total Protein: 7.7 g/dL (ref 6.0–8.5)
eGFR: 46 mL/min/{1.73_m2} — ABNORMAL LOW (ref >59)

## 2022-01-21 LAB — CBC WITH DIFFERENTIAL/PLATELET
Basophils Absolute: 0 10*3/uL (ref 0.0–0.2)
Basos: 1 %
EOS (ABSOLUTE): 0.1 10*3/uL (ref 0.0–0.4)
Eos: 1 %
Hematocrit: 39.9 % (ref 37.5–51.0)
Hemoglobin: 13.4 g/dL (ref 13.0–17.7)
Immature Grans (Abs): 0 10*3/uL (ref 0.0–0.1)
Immature Granulocytes: 0 %
Lymphocytes Absolute: 1.5 10*3/uL (ref 0.7–3.1)
Lymphs: 29 %
MCH: 29.9 pg (ref 26.6–33.0)
MCHC: 33.6 g/dL (ref 31.5–35.7)
MCV: 89 fL (ref 79–97)
Monocytes Absolute: 0.5 10*3/uL (ref 0.1–0.9)
Monocytes: 10 %
Neutrophils Absolute: 3.1 10*3/uL (ref 1.4–7.0)
Neutrophils: 59 %
Platelets: 190 10*3/uL (ref 150–450)
RBC: 4.48 x10E6/uL (ref 4.14–5.80)
RDW: 13.4 % (ref 11.6–15.4)
WBC: 5.3 10*3/uL (ref 3.4–10.8)

## 2022-01-21 LAB — HGB A1C W/O EAG: Hgb A1c MFr Bld: 7 % — ABNORMAL HIGH (ref 4.8–5.6)

## 2022-01-21 LAB — VITAMIN B12: Vitamin B-12: 430 pg/mL (ref 232–1245)

## 2022-01-21 LAB — CERULOPLASMIN: Ceruloplasmin: 22.7 mg/dL (ref 16.0–31.0)

## 2022-01-21 LAB — ANA W/REFLEX IF POSITIVE: Anti Nuclear Antibody (ANA): NEGATIVE

## 2022-01-21 LAB — FERRITIN: Ferritin: 220 ng/mL (ref 30–400)

## 2022-01-21 LAB — RPR: RPR Ser Ql: NONREACTIVE

## 2022-01-21 LAB — COPPER, SERUM: Copper: 124 ug/dL (ref 69–132)

## 2022-01-21 LAB — SEDIMENTATION RATE: Sed Rate: 55 mm/hr — ABNORMAL HIGH (ref 0–30)

## 2022-01-21 LAB — TSH: TSH: 1.59 u[IU]/mL (ref 0.450–4.500)

## 2022-01-21 LAB — CK: Total CK: 445 U/L — ABNORMAL HIGH (ref 41–331)

## 2022-01-21 LAB — C-REACTIVE PROTEIN: CRP: 6 mg/L (ref 0–10)

## 2022-01-23 ENCOUNTER — Other Ambulatory Visit: Payer: Self-pay

## 2022-01-23 ENCOUNTER — Ambulatory Visit: Payer: 59 | Attending: Neurology | Admitting: Physical Therapy

## 2022-01-23 ENCOUNTER — Encounter: Payer: Self-pay | Admitting: Physical Therapy

## 2022-01-23 DIAGNOSIS — R29818 Other symptoms and signs involving the nervous system: Secondary | ICD-10-CM | POA: Diagnosis present

## 2022-01-23 DIAGNOSIS — R2681 Unsteadiness on feet: Secondary | ICD-10-CM | POA: Insufficient documentation

## 2022-01-23 DIAGNOSIS — G20 Parkinson's disease: Secondary | ICD-10-CM | POA: Diagnosis not present

## 2022-01-23 DIAGNOSIS — R269 Unspecified abnormalities of gait and mobility: Secondary | ICD-10-CM | POA: Insufficient documentation

## 2022-01-23 DIAGNOSIS — G3184 Mild cognitive impairment, so stated: Secondary | ICD-10-CM | POA: Diagnosis not present

## 2022-01-23 DIAGNOSIS — R262 Difficulty in walking, not elsewhere classified: Secondary | ICD-10-CM | POA: Diagnosis present

## 2022-01-23 DIAGNOSIS — M6281 Muscle weakness (generalized): Secondary | ICD-10-CM | POA: Insufficient documentation

## 2022-01-23 NOTE — Therapy (Signed)
[facility] [address] ?[place], [place], [address] ?Phone: [phone]   Fax:  [phone] ? ?Physical Therapy Evaluation ? ?Patient Details  ?Name: Edwin Gallagher ?MRN: [id number] ?Date of Birth: 1956-09-21 ?Referring Provider (PT): Marcial Pacas ? ? ?Encounter Date: 01/23/2022 ? ? PT End of Session - 01/23/22 1148   ? ? Visit Number 1   ? Number of Visits 17   ? Date for PT Re-Evaluation 03/20/22   ? Authorization Type [employer]   ? Authorization Time Period 01/23/22 to 03/20/22   ? PT Start Time [id number]   ? PT Stop Time 1007   he asked to leave early today- had to get child to school event  ? PT Time Calculation (min) 35 min   ? Activity Tolerance Patient tolerated treatment well   ? Behavior During Therapy [facility] for tasks assessed/performed   ? ?  ?  ? ?  ? ? ?Past Medical History:  ?Diagnosis Date  ? Abnormal cardiac CT angiography 2006  ? only abnormal with soft plaque in LAD calcium score is0   ? Abnormal LFTs   ? Balance problem   ? CAD (coronary artery disease)   ? a. has seen Dr. Lia Foyer in the past and had cardiac CT 2006 with calcium score of 0. He had soft plaque in LAD of about 50%.  He had a normal stress echo in 2012. He also had a normal nuc in 2016.  ? Chest discomfort   ? Diabetes mellitus   ? Forgetfulness   ? Hyperlipidemia   ? Hypertension   ? Obesity   ? OSA (obstructive sleep apnea)   ? Paroxysmal atrial fibrillation (HCC)   ? Tremor   ? ? ?Past Surgical History:  ?Procedure Laterality Date  ? ROTATOR CUFF REPAIR  2009  ? Right  ? TRIGGER FINGER RELEASE  02/12/2012  ? Procedure: MINOR RELEASE TRIGGER FINGER/A-1 PULLEY;  Surgeon: Cammie Sickle., MD;  Location: [facility];  Service: Orthopedics;  Laterality: Right;  right thumb  ? ? ?There were no vitals filed for this visit. ? ? ? Subjective Assessment - 01/23/22 0936   ? ? Subjective My back does bother me, its just tightness that I notice when I'm standing and I can't do a lot for more  than a few minutes. The soreness and achiness in my back are not as intense as they used to be. I am unsteady, I've had parkinson like symptoms for less than a year but I've gotten used to it. The balance issues really just started within the past year but it has progressed, tremor issue has been ongoing too. Sometimes tremor seems better or worse depends on the day. No falls at home or close calls, but sometimes my feet do slip.   ? Patient Stated Goals have some sense of improvement, sense of normality in terms of walking and balance, improve back discomfort   ? Currently in Pain? Yes   ? Pain Score 2    ? Pain Location Back   ? Pain Orientation Left;Medial;Lower   ? Pain Descriptors / Indicators Aching;Discomfort   ? Pain Type Chronic pain   ? ?  ?  ? ?  ? ? ? ? ? [facility] PT Assessment - 01/23/22 0001   ? ?  ? Assessment  ? Medical Diagnosis parkinsonism   ? Referring Provider (PT) Marcial Pacas   ? Onset Date/Surgical Date --   within the past year  ?  Next MD Visit has regular appts with Dr. Krista Blue every 3-6 months   ? Prior Therapy PT in the past for balance   ?  ? Precautions  ? Precautions Fall   ?  ? Restrictions  ? Weight Bearing Restrictions No   ?  ? Balance Screen  ? Has the patient fallen in the past 6 months No   ? Has the patient had a decrease in activity level because of a fear of falling?  Yes   ? Is the patient reluctant to leave their home because of a fear of falling?  No   ?  ? Home Environment  ? Living Environment Private residence   ?  ? Prior Function  ? Level of Independence Independent;Independent with basic ADLs   ? Leisure "don't do much, I stay pretty busy with family"   has 9 kids with 3-4 still at home  ?  ? ROM / Strength  ? AROM / PROM / Strength Strength   ?  ? Strength  ? Strength Assessment Site Hip;Knee;Ankle   ? Right/Left Hip Right;Left   ? Right Hip Flexion 3+/5   ? Right Hip ABduction 3/5   ? Left Hip Flexion 3/5   ? Left Hip ABduction 4/5   ? Right/Left Knee Left;Right   ? Right  Knee Flexion 4-/5   ? Right Knee Extension 4+/5   ? Left Knee Flexion 3+/5   ? Left Knee Extension 4/5   ? Right/Left Ankle Right;Left   ? Right Ankle Dorsiflexion 5/5   ? Left Ankle Dorsiflexion 4/5   ?  ? Transfers  ? Five time sit to stand comments  15.3   ?  ? Ambulation/Gait  ? Gait Comments narrow BOS, tends to scissor and genreally mildly unsteady, flexed at hips   ?  ? Standardized Balance Assessment  ? Standardized Balance Assessment Berg Balance Test;Dynamic Gait Index   ?  ? Berg Balance Test  ? Sit to Stand Able to stand without using hands and stabilize independently   ? Standing Unsupported Able to stand safely 2 minutes   ? Sitting with Back Unsupported but Feet Supported on Floor or Stool Able to sit safely and securely 2 minutes   ? Stand to Sit Sits safely with minimal use of hands   ? Transfers Able to transfer safely, minor use of hands   ? Standing Unsupported with Eyes Closed Able to stand 10 seconds safely   ? Standing Unsupported with Feet Together Able to place feet together independently and stand for 1 minute with supervision   ? From Standing, Reach Forward with Outstretched Arm Can reach confidently >25 cm (10")   ? From Standing Position, Pick up Object from Floor Able to pick up shoe, needs supervision   ? From Standing Position, Turn to Look Behind Over each Shoulder Looks behind from both sides and weight shifts well   ? Turn 360 Degrees Able to turn 360 degrees safely one side only in 4 seconds or less   ? Standing Unsupported, Alternately Place Feet on Step/Stool Able to stand independently and complete 8 steps >20 seconds   ? Standing Unsupported, One Foot in Front Needs help to step but can hold 15 seconds   ? Standing on One Leg Able to lift leg independently and hold equal to or more than 3 seconds   ? Total Score 47   ?  ? Dynamic Gait Index  ? Level Surface Mild Impairment   ?  Change in Gait Speed Mild Impairment   ? Gait with Horizontal Head Turns Mild Impairment   ? Gait  with Vertical Head Turns Mild Impairment   ? Gait and Pivot Turn Moderate Impairment   ? Step Over Obstacle Mild Impairment   ? Step Around Obstacles Moderate Impairment   ? Steps Moderate Impairment   ? Total Score 13   ? ?  ?  ? ?  ? ? ? ? ? ? ? ? ? ? ? ? ? ?Objective measurements completed on examination: See above findings.  ? ? ? ? ? ? ? ? ? ? ? ? ? ? PT Education - 01/23/22 1147   ? ? Education Details exam findings, POC, HEP to be given next time as he needed to leave early today   ? Person(s) Educated Patient   ? Methods Explanation   ? Comprehension Verbalized understanding   ? ?  ?  ? ?  ? ? ? PT Short Term Goals - 01/23/22 1151   ? ?  ? PT SHORT TERM GOAL #1  ? Title Will be independent with appropriate progressive HEP   ? Time 4   ? Period Weeks   ? Status New   ? Target Date 02/20/22   ?  ? PT SHORT TERM GOAL #2  ? Title Will be able to tolerate 10-15 minutes of seated aerobic activity without fatigue to show improved functional activity tolerance   ? Time 4   ? Period Weeks   ? Status New   ?  ? PT SHORT TERM GOAL #3  ? Title Lumbar pain to be no more than 1/10 with functional task performance   ? Time 4   ? Period Weeks   ? Status New   ?  ? PT SHORT TERM GOAL #4  ? Title Will demonstrate improved gait mechanics incluidng better upright posture, reduced scissoring and drifting with gait,  improved foot clearance, and ability to maintian straight line ambulation for at least 433ft without increased fatigue   ? Time 4   ? Period Weeks   ? Status New   ? ?  ?  ? ?  ? ? ? ? PT Long Term Goals - 01/23/22 1153   ? ?  ? PT LONG TERM GOAL #1  ? Title MMT to improve by at least 1 grade in all weak groups   ? Time 8   ? Period Weeks   ? Status New   ? Target Date 03/20/22   ?  ? PT LONG TERM GOAL #2  ? Title Berg score to improve to 52 and DGI score to improve to at least 19   ? Time 8   ? Period Weeks   ? Status New   ?  ? PT LONG TERM GOAL #3  ? Title Will be able to tolerate at least 15-20 minutes of  standing activities and exercise without fatigue or increase in back pain to show improved functional activity tolerance   ? Time 8   ? Period Weeks   ? Status New   ?  ? PT LONG TERM GOAL #4  ? Title Will be inde

## 2022-01-24 ENCOUNTER — Encounter: Payer: Self-pay | Admitting: Physical Therapy

## 2022-01-24 ENCOUNTER — Ambulatory Visit: Payer: 59 | Admitting: Physical Therapy

## 2022-01-24 DIAGNOSIS — R262 Difficulty in walking, not elsewhere classified: Secondary | ICD-10-CM

## 2022-01-24 DIAGNOSIS — R2681 Unsteadiness on feet: Secondary | ICD-10-CM

## 2022-01-24 DIAGNOSIS — R29818 Other symptoms and signs involving the nervous system: Secondary | ICD-10-CM

## 2022-01-24 DIAGNOSIS — M6281 Muscle weakness (generalized): Secondary | ICD-10-CM

## 2022-01-24 NOTE — Therapy (Signed)
[place] ?[facility] ?[address] ?[place], [place], [address] ?Phone: [phone]   Fax:  [phone] ? ?Physical Therapy Treatment ? ?Patient Details  ?Name: Edwin Gallagher ?MRN: [id number] ?Date of Birth: [birth year] ?Referring Provider (PT): Levert FeinsteinYan Yijun ? ? ?Encounter Date: 01/24/2022 ? ? PT End of Session - 01/24/22 1222   ? ? Visit Number 2   ? Date for PT Re-Evaluation 03/20/22   ? Authorization Time Period 01/23/22 to 03/20/22   ? PT Start Time 1145   ? PT Stop Time 1226   ? PT Time Calculation (min) 41 min   ? Activity Tolerance Patient tolerated treatment well   ? Behavior [facility] for tasks assessed/performed   ? ?  ?  ? ?  ? ? ?Past Medical History:  ?Diagnosis Date  ? Abnormal cardiac CT angiography 2006  ? only abnormal with soft plaque in LAD calcium score is0   ? Abnormal LFTs   ? Balance problem   ? CAD (coronary artery disease)   ? a. has seen Dr. Riley KillStuckey in the past and had cardiac CT 2006 with calcium score of 0. He had soft plaque in LAD of about 50%.  He had a normal stress echo in 2012. He also had a normal nuc in 2016.  ? Chest discomfort   ? Diabetes mellitus   ? Forgetfulness   ? Hyperlipidemia   ? Hypertension   ? Obesity   ? OSA (obstructive sleep apnea)   ? Paroxysmal atrial fibrillation (HCC)   ? Tremor   ? ? ?Past Surgical History:  ?Procedure Laterality Date  ? ROTATOR CUFF REPAIR  2009  ? Right  ? TRIGGER FINGER RELEASE  02/12/2012  ? Procedure: MINOR RELEASE TRIGGER FINGER/A-1 PULLEY;  Surgeon: Wyn Forsterobert V Sypher Jr., MD;  Location: [facility];  Service: Orthopedics;  Laterality: Right;  right thumb  ? ? ?There were no vitals filed for this visit. ? ? Subjective Assessment - 01/24/22 1145   ? ? Subjective "no bad" back gets tired form standing a lot   ? Currently in Pain? No/denies   ? ?  ?  ? ?  ? ? ? ? ? ? ? ? ? ? ? ? ? ? ? ? ? ? ? ? [facility] Adult PT Treatment/Exercise - 01/24/22 0001   ? ?  ? Exercises  ? Exercises  Lumbar;Knee/Hip   ?  ? Lumbar Exercises: Aerobic  ? Nustep L4 x6 min   ?  ? Lumbar Exercises: Supine  ? Bridge Compliant;10 reps;2 seconds   x2  ? Other Supine Lumbar Exercises LE on Pball bridges, K2C, Oblq   ?  ? Knee/Hip Exercises: Machines for Strengthening  ? Cybex Knee Extension 10lb 2x10, LLE 5lb 2x10   ? Cybex Knee Flexion 25lb 2x10, LLE 15lb x10   ?  ? Knee/Hip Exercises: Seated  ? Sit to Sand 2 sets;10 reps;without UE support   ? ?  ?  ? ?  ? ? ? ? ? ? ? ? ? ? ? ? PT Short Term Goals - 01/23/22 1151   ? ?  ? PT SHORT TERM GOAL #1  ? Title Will be independent with appropriate progressive HEP   ? Time 4   ? Period Weeks   ? Status New   ? Target Date 02/20/22   ?  ? PT SHORT TERM GOAL #2  ? Title Will be able to tolerate 10-15 minutes of seated aerobic  activity without fatigue to show improved functional activity tolerance   ? Time 4   ? Period Weeks   ? Status New   ?  ? PT SHORT TERM GOAL #3  ? Title Lumbar pain to be no more than 1/10 with functional task performance   ? Time 4   ? Period Weeks   ? Status New   ?  ? PT SHORT TERM GOAL #4  ? Title Will demonstrate improved gait mechanics incluidng better upright posture, reduced scissoring and drifting with gait,  improved foot clearance, and ability to maintian straight line ambulation for at least 435ft without increased fatigue   ? Time 4   ? Period Weeks   ? Status New   ? ?  ?  ? ?  ? ? ? ? PT Long Term Goals - 01/23/22 1153   ? ?  ? PT LONG TERM GOAL #1  ? Title MMT to improve by at least 1 grade in all weak groups   ? Time 8   ? Period Weeks   ? Status New   ? Target Date 03/20/22   ?  ? PT LONG TERM GOAL #2  ? Title Berg score to improve to 52 and DGI score to improve to at least 19   ? Time 8   ? Period Weeks   ? Status New   ?  ? PT LONG TERM GOAL #3  ? Title Will be able to tolerate at least 15-20 minutes of standing activities and exercise without fatigue or increase in back pain to show improved functional activity tolerance   ? Time 8   ?  Period Weeks   ? Status New   ?  ? PT LONG TERM GOAL #4  ? Title Will be independent in appropriate gym based aerobic exercise program to maintain functional gains and to help slow progression of parkinsonian symptoms   ? Time 8   ? Period Weeks   ? Status New   ? ?  ?  ? ?  ? ? ? ? ? ? ? ? Plan - 01/24/22 1222   ? ? Clinical Impression Statement Pt tolerated an initial progression to TE we.. Session focused on LE and postural strengthening. Pt has a forward flexed postural at rest requiring cues throughout. Pt voiced that his LLE is weaker than R so some isolation strengthening was added for LLE. Bilateral hip and core weakness present with supine interventions.   ? Personal Factors and Comorbidities Time since onset of injury/illness/exacerbation;Past/Current Experience   ? Examination-Activity Limitations Locomotion Level;Transfers;Bed Mobility;Squat;Stairs;Lift;Stand   ? Examination-Participation Restrictions Cleaning;Community Activity;Interpersonal Relationship;Shop;Laundry;Volunteer;Pincus Badder Work   ? Stability/Clinical Decision Making Evolving/Moderate complexity   ? Rehab Potential Good   ? PT Frequency 2x / week   ? PT Duration 8 weeks   ? PT Treatment/Interventions ADLs/Self Care Home Management;Cryotherapy;Electrical Stimulation;Iontophoresis 4mg /ml Dexamethasone;Moist Heat;Traction;Ultrasound;DME Instruction;Gait training;Stair training;Functional mobility training;Therapeutic activities;Therapeutic exercise;Balance training;Neuromuscular re-education;Patient/family education;Manual techniques;Dry needling;Energy conservation;Taping   ? PT Next Visit Plan still needs HEP; work on general activity tolerance, lumbar mobility, strength, balance   ? PT Home Exercise Plan Supine bridges   ? ?  ?  ? ?  ? ? ?Patient will benefit from skilled therapeutic intervention in order to improve the following deficits and impairments:  Abnormal gait, Decreased coordination, Difficulty walking, Decreased endurance, Obesity,  Decreased activity tolerance, Pain, Decreased balance, Decreased mobility, Decreased strength, Postural dysfunction ? ?Visit Diagnosis: ?Difficulty in walking, not elsewhere classified ? ?Muscle weakness (generalized) ? ?  Unsteadiness on feet ? ?Other symptoms and signs involving the nervous system ? ? ? ? ?Problem List ?Patient Active Problem List  ? Diagnosis Date Noted  ? Parkinsonism (HCC) 01/18/2022  ? Parkinson's disease (HCC) 02/21/2021  ? Mild cognitive impairment 02/21/2021  ? Low back pain 02/21/2021  ? Bilateral low back pain without sciatica 01/25/2021  ? Tremor 01/05/2021  ? Gait abnormality 11/03/2020  ? Left-sided weakness 11/03/2020  ? Chronic left shoulder pain 11/03/2020  ? REM behavioral disorder 08/24/2020  ? Coagulation defect (HCC) 07/06/2020  ? Paroxysmal atrial fibrillation (HCC) 01/06/2019  ? OSA (obstructive sleep apnea) 09/24/2011  ? Chest pain 05/11/2011  ? Hypertension 05/11/2011  ? Obesity 05/11/2011  ? Hyperlipidemia 05/11/2011  ? ? ?Grayce Sessions, PTA ?01/24/2022, 12:30 PM ? ?[place] [facility] ?[address] ?[place], [place], [address] ?Phone: [phone]   Fax:  [phone] ? ?Name: Edwin Gallagher ?MRN: [id number] ?Date of Birth: 03-Aug-1956 ? ? ? ?

## 2022-02-06 ENCOUNTER — Ambulatory Visit: Payer: 59 | Admitting: Physical Therapy

## 2022-02-06 ENCOUNTER — Encounter: Payer: Self-pay | Admitting: Physical Therapy

## 2022-02-06 ENCOUNTER — Other Ambulatory Visit: Payer: Self-pay

## 2022-02-06 DIAGNOSIS — M6281 Muscle weakness (generalized): Secondary | ICD-10-CM

## 2022-02-06 DIAGNOSIS — R2681 Unsteadiness on feet: Secondary | ICD-10-CM

## 2022-02-06 DIAGNOSIS — R262 Difficulty in walking, not elsewhere classified: Secondary | ICD-10-CM

## 2022-02-06 DIAGNOSIS — R29818 Other symptoms and signs involving the nervous system: Secondary | ICD-10-CM

## 2022-02-06 NOTE — Therapy (Signed)
Edwin Gallagher ?[facility] ?[address] ?[place], [place], [address] ?Phone: [phone]   Fax:  [phone] ? ?Physical Therapy Treatment ? ?Patient Details  ?Name: Edwin Gallagher ?MRN: [id number] ?Date of Birth: 1956-06-21 ?Referring Provider (PT): Edwin FeinsteinYan Gallagher ? ? ?Encounter Date: 02/06/2022 ? ? PT End of Session - 02/06/22 1020   ? ? Visit Number 3   ? Number of Visits 17   ? Date for PT Re-Evaluation 03/20/22   ? Authorization Type Aetna   ? Authorization Time Period 01/23/22 to 03/20/22   ? PT Start Time [phone]   ? PT Stop Time 1013   ? PT Time Calculation (min) 40 min   ? Activity Tolerance Patient tolerated treatment well   ? Behavior During Therapy [facility] for tasks assessed/performed   ? ?  ?  ? ?  ? ? ?Past Medical History:  ?Diagnosis Date  ? Abnormal cardiac CT angiography 2006  ? only abnormal with soft plaque in LAD calcium score is0   ? Abnormal LFTs   ? Balance problem   ? CAD (coronary artery disease)   ? a. has seen Dr. Riley Gallagher in the past and had cardiac CT 2006 with calcium score of 0. He had soft plaque in LAD of about 50%.  He had a normal stress echo in 2012. He also had a normal nuc in 2016.  ? Chest discomfort   ? Diabetes mellitus   ? Forgetfulness   ? Hyperlipidemia   ? Hypertension   ? Obesity   ? OSA (obstructive sleep apnea)   ? Paroxysmal atrial fibrillation (HCC)   ? Tremor   ? ? ?Past Surgical History:  ?Procedure Laterality Date  ? ROTATOR CUFF REPAIR  2009  ? Right  ? TRIGGER FINGER RELEASE  02/12/2012  ? Procedure: MINOR RELEASE TRIGGER FINGER/A-1 PULLEY;  Surgeon: Edwin Gallagher., MD;  Location: [facility];  Service: Orthopedics;  Laterality: Right;  right thumb  ? ? ?There were no vitals filed for this visit. ? ? Subjective Assessment - 02/06/22 0935   ? ? Subjective I'm doing good, still having that usual discomfort in my low back, nothing else new   ? Patient Stated Goals have some sense of improvement, sense of normality in terms of  walking and balance, improve back discomfort   ? Currently in Pain? Yes   ? Pain Score 2    ? Pain Location Back   ? Pain Orientation Left;Medial;Lower   ? Pain Descriptors / Indicators Aching;Discomfort   ? Pain Type Chronic pain   ? ?  ?  ? ?  ? ? ? ? ? ? ? ? ? ? ? ? ? ? ? ? ? ? ? ? [facility] Adult PT Treatment/Exercise - 02/06/22 0001   ? ?  ? Lumbar Exercises: Stretches  ? Single Knee to Chest Stretch Right;Left;5 reps   ? Single Knee to Chest Stretch Limitations 5 second holds   ? Lower Trunk Rotation 3 reps   ? Lower Trunk Rotation Limitations 5 seconds   ? Hip Flexor Stretch Right;Left;2 reps;30 seconds   ? Hip Flexor Stretch Limitations overpressure from PT   ?  ? Lumbar Exercises: Supine  ? Pelvic Tilt 10 reps;5 seconds   ?  ? Knee/Hip Exercises: Standing  ? Heel Raises Both;1 set;15 reps   ? Heel Raises Limitations heel and toe raises   ? Lateral Step Up Both;1 set;10 reps;Hand Hold: 2   ? Forward  Step Up Both;1 set;15 reps;Hand Hold: 1;Step Height: 6"   ? ?  ?  ? ?  ? ? ? ? ? ? Balance Exercises - 02/06/22 0001   ? ?  ? Balance Exercises: Standing  ? Tandem Stance Eyes open;3 reps;15 secs   ? Tandem Gait Forward;2 reps   in // bars  ? ?  ?  ? ?  ? ? ? ? ? PT Education - 02/06/22 1020   ? ? Education Details exericse form and purpose of targeted exercises to low back   ? Person(s) Educated Patient   ? Methods Explanation   ? Comprehension Verbalized understanding   ? ?  ?  ? ?  ? ? ? PT Short Term Goals - 01/23/22 1151   ? ?  ? PT SHORT TERM GOAL #1  ? Title Will be independent with appropriate progressive HEP   ? Time 4   ? Period Weeks   ? Status New   ? Target Date 02/20/22   ?  ? PT SHORT TERM GOAL #2  ? Title Will be able to tolerate 10-15 minutes of seated aerobic activity without fatigue to show improved functional activity tolerance   ? Time 4   ? Period Weeks   ? Status New   ?  ? PT SHORT TERM GOAL #3  ? Title Lumbar pain to be no more than 1/10 with functional task performance   ? Time 4   ? Period  Weeks   ? Status New   ?  ? PT SHORT TERM GOAL #4  ? Title Will demonstrate improved gait mechanics incluidng better upright posture, reduced scissoring and drifting with gait,  improved foot clearance, and ability to maintian straight line ambulation for at least 4110ft without increased fatigue   ? Time 4   ? Period Weeks   ? Status New   ? ?  ?  ? ?  ? ? ? ? PT Long Term Goals - 01/23/22 1153   ? ?  ? PT LONG TERM GOAL #1  ? Title MMT to improve by at least 1 grade in all weak groups   ? Time 8   ? Period Weeks   ? Status New   ? Target Date 03/20/22   ?  ? PT LONG TERM GOAL #2  ? Title Berg score to improve to 52 and DGI score to improve to at least 19   ? Time 8   ? Period Weeks   ? Status New   ?  ? PT LONG TERM GOAL #3  ? Title Will be able to tolerate at least 15-20 minutes of standing activities and exercise without fatigue or increase in back pain to show improved functional activity tolerance   ? Time 8   ? Period Weeks   ? Status New   ?  ? PT LONG TERM GOAL #4  ? Title Will be independent in appropriate gym based aerobic exercise program to maintain functional gains and to help slow progression of parkinsonian symptoms   ? Time 8   ? Period Weeks   ? Status New   ? ?  ?  ? ?  ? ? ? ? ? ? ? ? Plan - 02/06/22 1020   ? ? Clinical Impression Statement [name] arrives today doing well, still having some discomfort in his back but tells me that what we did last time did help this. We focused on strength, balance, and interventions for his  low back today since it seems we had some success with this last session. Remains motivated, I think he will make good progress with PT.   ? Personal Factors and Comorbidities Time since onset of injury/illness/exacerbation;Past/Current Experience   ? Examination-Activity Limitations Locomotion Level;Transfers;Bed Mobility;Squat;Stairs;Lift;Stand   ? Examination-Participation Restrictions Cleaning;Community Activity;Interpersonal Relationship;Shop;Laundry;Volunteer;Pincus Badder Work    ? Stability/Clinical Decision Making Evolving/Moderate complexity   ? Clinical Decision Making Moderate   ? Rehab Potential Good   ? PT Frequency 2x / week   ? PT Duration 8 weeks   ? PT Treatment/Interventions ADLs/Self Care Home Management;Cryotherapy;Electrical Stimulation;Iontophoresis 4mg /ml Dexamethasone;Moist Heat;Traction;Ultrasound;DME Instruction;Gait training;Stair training;Functional mobility training;Therapeutic activities;Therapeutic exercise;Balance training;Neuromuscular re-education;Patient/family education;Manual techniques;Dry needling;Energy conservation;Taping   ? PT Next Visit Plan still needs HEP; work on general activity tolerance, lumbar mobility, strength, balance   ? PT Home Exercise Plan Supine bridges   ? Consulted and Agree with Plan of Care Patient   ? ?  ?  ? ?  ? ? ?Patient will benefit from skilled therapeutic intervention in order to improve the following deficits and impairments:  Abnormal gait, Decreased coordination, Difficulty walking, Decreased endurance, Obesity, Decreased activity tolerance, Pain, Decreased balance, Decreased mobility, Decreased strength, Postural dysfunction ? ?Visit Diagnosis: ?Difficulty in walking, not elsewhere classified ? ?Muscle weakness (generalized) ? ?Unsteadiness on feet ? ?Other symptoms and signs involving the nervous system ? ? ? ? ?Problem List ?Patient Active Problem List  ? Diagnosis Date Noted  ? Parkinsonism (HCC) 01/18/2022  ? Parkinson's disease (HCC) 02/21/2021  ? Mild cognitive impairment 02/21/2021  ? Low back pain 02/21/2021  ? Bilateral low back pain without sciatica 01/25/2021  ? Tremor 01/05/2021  ? Gait abnormality 11/03/2020  ? Left-sided weakness 11/03/2020  ? Chronic left shoulder pain 11/03/2020  ? REM behavioral disorder 08/24/2020  ? Coagulation defect (HCC) 07/06/2020  ? Paroxysmal atrial fibrillation (HCC) 01/06/2019  ? OSA (obstructive sleep apnea) 09/24/2011  ? Chest pain 05/11/2011  ? Hypertension 05/11/2011  ?  Obesity 05/11/2011  ? Hyperlipidemia 05/11/2011  ? ? U PT, DPT, PN2  ? ?Supplemental Physical Therapist ?[place]  ? ? ? ? ? ?[place] ?[facility] ?05/13/2011 8882

## 2022-02-09 ENCOUNTER — Ambulatory Visit: Payer: 59 | Admitting: Physical Therapy

## 2022-02-09 ENCOUNTER — Encounter: Payer: Self-pay | Admitting: Physical Therapy

## 2022-02-09 DIAGNOSIS — R2681 Unsteadiness on feet: Secondary | ICD-10-CM

## 2022-02-09 DIAGNOSIS — M6281 Muscle weakness (generalized): Secondary | ICD-10-CM

## 2022-02-09 DIAGNOSIS — R29818 Other symptoms and signs involving the nervous system: Secondary | ICD-10-CM

## 2022-02-09 DIAGNOSIS — R262 Difficulty in walking, not elsewhere classified: Secondary | ICD-10-CM | POA: Diagnosis not present

## 2022-02-09 NOTE — Therapy (Signed)
[place] [facility] ?[address] ?[place], [place], [address] ?Phone: [phone]   Fax:  [phone] ? ?Physical Therapy Treatment ? ?Patient Details  ?Name: Edwin Gallagher ?MRN: [id number] ?Date of Birth: [birth year] ?Referring Provider (PT): Levert Feinstein ? ? ?Encounter Date: 02/09/2022 ? ? PT End of Session - 02/09/22 1124   ? ? Visit Number 4   ? Number of Visits 17   ? Date for PT Re-Evaluation 03/20/22   ? Authorization Type Aetna   ? Authorization Time Period 01/23/22 to 03/20/22   ? PT Start Time 1017   ? PT Stop Time 1055   ? PT Time Calculation (min) 38 min   ? Activity Tolerance Patient tolerated treatment well   ? Behavior During Therapy [facility] for tasks assessed/performed   ? ?  ?  ? ?  ? ? ?Past Medical History:  ?Diagnosis Date  ? Abnormal cardiac CT angiography 2006  ? only abnormal with soft plaque in LAD calcium score is0   ? Abnormal LFTs   ? Balance problem   ? CAD (coronary artery disease)   ? a. has seen Dr. Riley Kill in the past and had cardiac CT 2006 with calcium score of 0. He had soft plaque in LAD of about 50%.  He had a normal stress echo in 2012. He also had a normal nuc in 2016.  ? Chest discomfort   ? Diabetes mellitus   ? Forgetfulness   ? Hyperlipidemia   ? Hypertension   ? Obesity   ? OSA (obstructive sleep apnea)   ? Paroxysmal atrial fibrillation (HCC)   ? Tremor   ? ? ?Past Surgical History:  ?Procedure Laterality Date  ? ROTATOR CUFF REPAIR  2009  ? Right  ? TRIGGER FINGER RELEASE  02/12/2012  ? Procedure: MINOR RELEASE TRIGGER FINGER/A-1 PULLEY;  Surgeon: Wyn Forster., MD;  Location: [facility];  Service: Orthopedics;  Laterality: Right;  right thumb  ? ? ?There were no vitals filed for this visit. ? ? Subjective Assessment - 02/09/22 1022   ? ? Subjective Patietn reports his pain is better. He has been performing some stretches and strengthening, which seem to be helping his back pain.   ? Patient Stated Goals have some sense  of improvement, sense of normality in terms of walking and balance, improve back discomfort   ? Currently in Pain? No/denies   ? ?  ?  ? ?  ? ? ? ? ? ? ? ? ? ? ? ? ? ? ? ? ? ? ? ? [facility] Adult PT Treatment/Exercise - 02/09/22 0001   ? ?  ? Ambulation/Gait  ? Gait Comments Walked in in flexed posture, smaller step length B. After treatment activities he stood more upright , with normalized step length   ?  ? Lumbar Exercises: Stretches  ? Double Knee to Chest Stretch 3 reps;20 seconds   ? Double Knee to Chest Stretch Limitations On physioball   ? Lower Trunk Rotation 3 reps   ? Lower Trunk Rotation Limitations 10 sec   ? Hip Flexor Stretch Right;Left;1 rep;60 seconds   ? Hip Flexor Stretch Limitations overpressure from PT   ? Pelvic Tilt 5 reps;5 seconds   ?  ? Lumbar Exercises: Aerobic  ? Nustep L5 x6 min   ?  ? Lumbar Exercises: Supine  ? Bridge Compliant;10 reps;2 seconds   ? Bridge Limitations Repeated with ball rolls side to side x 10 reps.   ?  ?  Lumbar Exercises: Sidelying  ? Other Sidelying Lumbar Exercises Sidelying hip abd with forward and backward motion of leg, 5 reps each side.   ?  ? Knee/Hip Exercises: Standing  ? Other Standing Knee Exercises Side to side stepping over Half bolster, increase speed of movement as tolerated x 15 reps, CGA.   ? ?  ?  ? ?  ? ? ? ? ? ? ? ? ? ? ? ? PT Short Term Goals - 02/09/22 1139   ? ?  ? PT SHORT TERM GOAL #1  ? Title Will be independent with appropriate progressive HEP   ? Time 4   ? Period Weeks   ? Status On-going   ? Target Date 02/20/22   ?  ? PT SHORT TERM GOAL #2  ? Title Will be able to tolerate 10-15 minutes of seated aerobic activity without fatigue to show improved functional activity tolerance   ? Time 4   ? Period Weeks   ? Status On-going   ?  ? PT SHORT TERM GOAL #3  ? Title Lumbar pain to be no more than 1/10 with functional task performance   ? Time 4   ? Period Weeks   ? Status On-going   ?  ? PT SHORT TERM GOAL #4  ? Title Will demonstrate improved  gait mechanics incluidng better upright posture, reduced scissoring and drifting with gait,  improved foot clearance, and ability to maintian straight line ambulation for at least 46500ft without increased fatigue   ? Time 4   ? Period Weeks   ? Status On-going   ? ?  ?  ? ?  ? ? ? ? PT Long Term Goals - 01/23/22 1153   ? ?  ? PT LONG TERM GOAL #1  ? Title MMT to improve by at least 1 grade in all weak groups   ? Time 8   ? Period Weeks   ? Status New   ? Target Date 03/20/22   ?  ? PT LONG TERM GOAL #2  ? Title Berg score to improve to 52 and DGI score to improve to at least 19   ? Time 8   ? Period Weeks   ? Status New   ?  ? PT LONG TERM GOAL #3  ? Title Will be able to tolerate at least 15-20 minutes of standing activities and exercise without fatigue or increase in back pain to show improved functional activity tolerance   ? Time 8   ? Period Weeks   ? Status New   ?  ? PT LONG TERM GOAL #4  ? Title Will be independent in appropriate gym based aerobic exercise program to maintain functional gains and to help slow progression of parkinsonian symptoms   ? Time 8   ? Period Weeks   ? Status New   ? ?  ?  ? ?  ? ? ? ? ? ? ? ? Plan - 02/09/22 1127   ? ? Clinical Impression Statement Patient reports stiffness, but no real back pain. Treatmetn continued to focus on stretching, strengthening, and functional moiblity re-education to improve balance and decrease fall risk. He demosntrates B hip abductor weakness as well as hip flexor tightness.   ? Personal Factors and Comorbidities Time since onset of injury/illness/exacerbation;Past/Current Experience   ? Examination-Activity Limitations Locomotion Level;Transfers;Bed Mobility;Squat;Stairs;Lift;Stand   ? Examination-Participation Restrictions Cleaning;Community Activity;Interpersonal Relationship;Shop;Laundry;Volunteer;Pincus BadderYard Work   ? Stability/Clinical Decision Making Evolving/Moderate complexity   ? Clinical Decision  Making Moderate   ? Rehab Potential Good   ? PT  Frequency 2x / week   ? PT Duration 8 weeks   ? PT Treatment/Interventions ADLs/Self Care Home Management;Cryotherapy;Electrical Stimulation;Iontophoresis 4mg /ml Dexamethasone;Moist Heat;Traction;Ultrasound;DME Instruction;Gait training;Stair training;Functional mobility training;Therapeutic activities;Therapeutic exercise;Balance training;Neuromuscular re-education;Patient/family education;Manual techniques;Dry needling;Energy conservation;Taping   ? PT Next Visit Plan HEP, Assess stretching with a strap for HS   ? PT Home Exercise Plan Supine bridges   ? Consulted and Agree with Plan of Care Patient   ? ?  ?  ? ?  ? ? ?Patient will benefit from skilled therapeutic intervention in order to improve the following deficits and impairments:  Abnormal gait, Decreased coordination, Difficulty walking, Decreased endurance, Obesity, Decreased activity tolerance, Pain, Decreased balance, Decreased mobility, Decreased strength, Postural dysfunction ? ?Visit Diagnosis: ?Difficulty in walking, not elsewhere classified ? ?Muscle weakness (generalized) ? ?Unsteadiness on feet ? ?Other symptoms and signs involving the nervous system ? ? ? ? ?Problem List ?Patient Active Problem List  ? Diagnosis Date Noted  ? Parkinsonism (HCC) 01/18/2022  ? Parkinson's disease (HCC) 02/21/2021  ? Mild cognitive impairment 02/21/2021  ? Low back pain 02/21/2021  ? Bilateral low back pain without sciatica 01/25/2021  ? Tremor 01/05/2021  ? Gait abnormality 11/03/2020  ? Left-sided weakness 11/03/2020  ? Chronic left shoulder pain 11/03/2020  ? REM behavioral disorder 08/24/2020  ? Coagulation defect (HCC) 07/06/2020  ? Paroxysmal atrial fibrillation (HCC) 01/06/2019  ? OSA (obstructive sleep apnea) 09/24/2011  ? Chest pain 05/11/2011  ? Hypertension 05/11/2011  ? Obesity 05/11/2011  ? Hyperlipidemia 05/11/2011  ? ? ?05/13/2011, DPT ?02/09/2022, 11:42 AM ? ?[place] ?[facility] ?[address] ?[place], [place], [place] ?Phone: [phone]   Fax:  [phone] ? ?Name: Edwin Gallagher ?MRN: [id number] ?Date of Birth: Mar 25, 1956 ? ? ? ?

## 2022-02-13 ENCOUNTER — Encounter: Payer: Self-pay | Admitting: Physical Therapy

## 2022-02-13 ENCOUNTER — Ambulatory Visit: Payer: 59 | Attending: Neurology | Admitting: Physical Therapy

## 2022-02-13 DIAGNOSIS — R2681 Unsteadiness on feet: Secondary | ICD-10-CM | POA: Insufficient documentation

## 2022-02-13 DIAGNOSIS — M6281 Muscle weakness (generalized): Secondary | ICD-10-CM | POA: Insufficient documentation

## 2022-02-13 DIAGNOSIS — R262 Difficulty in walking, not elsewhere classified: Secondary | ICD-10-CM | POA: Insufficient documentation

## 2022-02-13 DIAGNOSIS — R29818 Other symptoms and signs involving the nervous system: Secondary | ICD-10-CM | POA: Diagnosis present

## 2022-02-13 NOTE — Therapy (Signed)
[place] ?[facility] ?[address] ?[place], [place], [address] ?Phone: [phone]   Fax:  [phone] ? ?Physical Therapy Treatment ? ?Patient Details  ?Name: Edwin Gallagher ?MRN: [id number] ?Date of Birth: 1955/12/27 ?Referring Provider (PT): Levert Feinstein ? ? ?Encounter Date: 02/13/2022 ? ? PT End of Session - 02/13/22 1442   ? ? Visit Number 5   ? Date for PT Re-Evaluation 03/20/22   ? Authorization Type Aetna   ? PT Start Time 1400   ? PT Stop Time 1444   ? PT Time Calculation (min) 44 min   ? Activity Tolerance Patient tolerated treatment well   ? Behavior During Therapy [facility] for tasks assessed/performed   ? ?  ?  ? ?  ? ? ?Past Medical History:  ?Diagnosis Date  ? Abnormal cardiac CT angiography 2006  ? only abnormal with soft plaque in LAD calcium score is0   ? Abnormal LFTs   ? Balance problem   ? CAD (coronary artery disease)   ? a. has seen Dr. Riley Kill in the past and had cardiac CT 2006 with calcium score of 0. He had soft plaque in LAD of about 50%.  He had a normal stress echo in 2012. He also had a normal nuc in 2016.  ? Chest discomfort   ? Diabetes mellitus   ? Forgetfulness   ? Hyperlipidemia   ? Hypertension   ? Obesity   ? OSA (obstructive sleep apnea)   ? Paroxysmal atrial fibrillation (HCC)   ? Tremor   ? ? ?Past Surgical History:  ?Procedure Laterality Date  ? ROTATOR CUFF REPAIR  2009  ? Right  ? TRIGGER FINGER RELEASE  02/12/2012  ? Procedure: MINOR RELEASE TRIGGER FINGER/A-1 PULLEY;  Surgeon: Wyn Forster., MD;  Location: [facility];  Service: Orthopedics;  Laterality: Right;  right thumb  ? ? ?There were no vitals filed for this visit. ? ? Subjective Assessment - 02/13/22 1402   ? ? Subjective Reports doing okay, reports that he is trying to walk better, and stay active, reports less back pain   ? Currently in Pain? No/denies   ? ?  ?  ? ?  ? ? ? ? ? ? ? ? ? ? ? ? ? ? ? ? ? ? ? ? [facility] Adult PT Treatment/Exercise - 02/13/22 0001   ? ?  ?  Ambulation/Gait  ? Gait Comments tried walking with two SPC's to help with posture, did okay, still trouble with the left toe drag   ?  ? High Level Balance  ? High Level Balance Comments cone toue touches, then did this on the airex, on airex ball toos   ?  ? Lumbar Exercises: Aerobic  ? Flora Lipps with UE support .8 MPH x 2 minutes cues to pick up left toe.   ? Nustep L5 x6 min   ?  ? Lumbar Exercises: Machines for Strengthening  ? Cybex Knee Extension 5# 2x5 with both, then left only 2x5   ?  ? Lumbar Exercises: Standing  ? Other Standing Lumbar Exercises back to wall working on holding posture and straightening left knee   ?  ? Knee/Hip Exercises: Standing  ? Walking with Sports Cord 30# total fwd and bkwd with CGA   ? ?  ?  ? ?  ? ? ? ? ? ? ? ? ? ? ? ? PT Short Term Goals - 02/09/22 1139   ? ?  ?  PT SHORT TERM GOAL #1  ? Title Will be independent with appropriate progressive HEP   ? Time 4   ? Period Weeks   ? Status On-going   ? Target Date 02/20/22   ?  ? PT SHORT TERM GOAL #2  ? Title Will be able to tolerate 10-15 minutes of seated aerobic activity without fatigue to show improved functional activity tolerance   ? Time 4   ? Period Weeks   ? Status On-going   ?  ? PT SHORT TERM GOAL #3  ? Title Lumbar pain to be no more than 1/10 with functional task performance   ? Time 4   ? Period Weeks   ? Status On-going   ?  ? PT SHORT TERM GOAL #4  ? Title Will demonstrate improved gait mechanics incluidng better upright posture, reduced scissoring and drifting with gait,  improved foot clearance, and ability to maintian straight line ambulation for at least 44800ft without increased fatigue   ? Time 4   ? Period Weeks   ? Status On-going   ? ?  ?  ? ?  ? ? ? ? PT Long Term Goals - 02/13/22 1447   ? ?  ? PT LONG TERM GOAL #1  ? Title MMT to improve by at least 1 grade in all weak groups   ? Status On-going   ?  ? PT LONG TERM GOAL #2  ? Title Berg score to improve to 52 and DGI score to improve to at least 19   ?  Status On-going   ? ?  ?  ? ?  ? ? ? ? ? ? ? ? Plan - 02/13/22 1443   ? ? Clinical Impression Statement Patient came in with bigger steps however still forward flexed and it seems that the left knee remains bent during stance phase, I added some balance, the resisted gait and some strength.  He seemed to get really tired, he did have difficlty on the treadmill   ? PT Next Visit Plan work on strength, balance and walking, left knee and posture   ? Consulted and Agree with Plan of Care Patient   ? ?  ?  ? ?  ? ? ?Patient will benefit from skilled therapeutic intervention in order to improve the following deficits and impairments:  Abnormal gait, Decreased coordination, Difficulty walking, Decreased endurance, Obesity, Decreased activity tolerance, Pain, Decreased balance, Decreased mobility, Decreased strength, Postural dysfunction ? ?Visit Diagnosis: ?Difficulty in walking, not elsewhere classified ? ?Muscle weakness (generalized) ? ?Unsteadiness on feet ? ?Other symptoms and signs involving the nervous system ? ? ? ? ?Problem List ?Patient Active Problem List  ? Diagnosis Date Noted  ? Parkinsonism (HCC) 01/18/2022  ? Parkinson's disease (HCC) 02/21/2021  ? Mild cognitive impairment 02/21/2021  ? Low back pain 02/21/2021  ? Bilateral low back pain without sciatica 01/25/2021  ? Tremor 01/05/2021  ? Gait abnormality 11/03/2020  ? Left-sided weakness 11/03/2020  ? Chronic left shoulder pain 11/03/2020  ? REM behavioral disorder 08/24/2020  ? Coagulation defect (HCC) 07/06/2020  ? Paroxysmal atrial fibrillation (HCC) 01/06/2019  ? OSA (obstructive sleep apnea) 09/24/2011  ? Chest pain 05/11/2011  ? Hypertension 05/11/2011  ? Obesity 05/11/2011  ? Hyperlipidemia 05/11/2011  ? ? Jearld Lesch?, W, PT ?02/13/2022, 2:48 PM ? ?[place] ?[facility] ?[id number] ?[place], [place], [address] ?Phone: [phone]   Fax:  [phone] ? ?Name: Edwin Gallagher ?MRN: [id number] ?Date of  Birth:  22-Oct-1956 ? ? ? ?

## 2022-02-14 ENCOUNTER — Ambulatory Visit: Payer: 59 | Admitting: Physical Therapy

## 2022-02-15 ENCOUNTER — Ambulatory Visit: Payer: 59 | Admitting: Physical Therapy

## 2022-02-15 ENCOUNTER — Encounter: Payer: Self-pay | Admitting: Physical Therapy

## 2022-02-15 DIAGNOSIS — R262 Difficulty in walking, not elsewhere classified: Secondary | ICD-10-CM | POA: Diagnosis not present

## 2022-02-15 DIAGNOSIS — R2681 Unsteadiness on feet: Secondary | ICD-10-CM

## 2022-02-15 DIAGNOSIS — R29818 Other symptoms and signs involving the nervous system: Secondary | ICD-10-CM

## 2022-02-15 DIAGNOSIS — M6281 Muscle weakness (generalized): Secondary | ICD-10-CM

## 2022-02-15 NOTE — Patient Instructions (Addendum)
? ?  Exercises ?- Supine Hamstring Stretch with Strap  - 1 x daily - 7 x weekly - 1 reps - 30 hold ?- Supine ITB Stretch with Strap  - 1 x daily - 7 x weekly - 1 reps - 30 hold ?- Hip Adductors and Hamstring Stretch with Strap  - 1 x daily - 7 x weekly - 1 reps - 30 hold ?- Supine ITB Stretch  - 1 x daily - 7 x weekly - 1 reps - 30 hold ?- Single Knee to Chest Stretch  - 1 x daily - 7 x weekly - 3 reps - 10 hold ?- Supine Double Knee to Chest  - 1 x daily - 7 x weekly - 3 reps - 10 hold ?- Bridge with Arms at [employer] and Feet on [employer]  - 1 x daily - 7 x weekly - 2 sets - 10 reps ?- Bridge on ball and roll it side to side.  - 1 x daily - 7 x weekly - 2 sets - 10 reps ?- Sit to Stand Without Arm Support  - 1 x daily - 7 x weekly - 2 sets - 10 reps ?

## 2022-02-15 NOTE — Therapy (Signed)
[place] ?[facility] ?[id number] ?[place], [place], [address] ?Phone: [phone]   Fax:  [phone] ? ?Physical Therapy Treatment ? ?Patient Details  ?Name: Edwin Gallagher ?MRN: [id number] ?Date of Birth: 02-Nov-1956 ?Referring Provider (PT): Levert FeinsteinYan Yijun ? ? ?Encounter Date: 02/15/2022 ? ? PT End of Session - 02/15/22 1014   ? ? Visit Number 6   ? Date for PT Re-Evaluation 03/20/22   ? Authorization Type Aetna   ? PT Start Time 0932   ? PT Stop Time 1015   ? PT Time Calculation (min) 43 min   ? Activity Tolerance Patient tolerated treatment well   ? Behavior During Therapy [facility] for tasks assessed/performed   ? ?  ?  ? ?  ? ? ?Past Medical History:  ?Diagnosis Date  ? Abnormal cardiac CT angiography 2006  ? only abnormal with soft plaque in LAD calcium score is0   ? Abnormal LFTs   ? Balance problem   ? CAD (coronary artery disease)   ? a. has seen Dr. Riley KillStuckey in the past and had cardiac CT 2006 with calcium score of 0. He had soft plaque in LAD of about 50%.  He had a normal stress echo in 2012. He also had a normal nuc in 2016.  ? Chest discomfort   ? Diabetes mellitus   ? Forgetfulness   ? Hyperlipidemia   ? Hypertension   ? Obesity   ? OSA (obstructive sleep apnea)   ? Paroxysmal atrial fibrillation (HCC)   ? Tremor   ? ? ?Past Surgical History:  ?Procedure Laterality Date  ? ROTATOR CUFF REPAIR  2009  ? Right  ? TRIGGER FINGER RELEASE  02/12/2012  ? Procedure: MINOR RELEASE TRIGGER FINGER/A-1 PULLEY;  Surgeon: Wyn Forsterobert V Sypher Jr., MD;  Location: [facility];  Service: Orthopedics;  Laterality: Right;  right thumb  ? ? ?There were no vitals filed for this visit. ? ? Subjective Assessment - 02/15/22 0937   ? ? Subjective Patient reports no new issues. He arrives walking more upright, reports that he is focusing on improved knee extension, which also helps him to stand taller.   ? Currently in Pain? No/denies   ? ?  ?  ? ?  ? ? ? ? ? ? ? ? ? ? ? ? ? ? ? ? ? ? ? ? [facility]  Adult PT Treatment/Exercise - 02/15/22 0001   ? ?  ? Lumbar Exercises: Stretches  ? Passive Hamstring Stretch Right;Left;1 rep;30 seconds   ? Passive Hamstring Stretch Limitations Also stretch abd/add 30 sec each   ? Single Knee to Chest Stretch Right;Left;5 reps   ? Double Knee to Chest Stretch 10 seconds   ? Lower Trunk Rotation 3 reps   with knees crossed.  ? Lower Trunk Rotation Limitations 10 sec   ? Hip Flexor Stretch Right;Left;1 rep;60 seconds   ? Hip Flexor Stretch Limitations overpressure from PT   ? Pelvic Tilt 5 reps   ? Pelvic Tilt Limitations In sit, for ROM   ?  ? Lumbar Exercises: Aerobic  ? Recumbent Bike L4 x 5 minutes   ?  ? Lumbar Exercises: Seated  ? Sit to Stand 10 reps   ? Sit to Stand Limitations no UE   ?  ? Lumbar Exercises: Supine  ? Bridge Compliant;10 reps;2 seconds   ? Bridge Limitations Repeated with ball rolls side to side x 10 reps.   ? Bridge with [employer]Ball Squeeze  10 reps;Compliant;2 seconds   ? ?  ?  ? ?  ? ? ? ? ? ? ? ? ? ? PT Education - 02/15/22 1014   ? ? Education Details HEP   ? Person(s) Educated Patient   ? Methods Explanation;Demonstration;Handout   ? Comprehension Verbalized understanding;Returned demonstration   ? ?  ?  ? ?  ? ? ? PT Short Term Goals - 02/09/22 1139   ? ?  ? PT SHORT TERM GOAL #1  ? Title Will be independent with appropriate progressive HEP   ? Time 4   ? Period Weeks   ? Status On-going   ? Target Date 02/20/22   ?  ? PT SHORT TERM GOAL #2  ? Title Will be able to tolerate 10-15 minutes of seated aerobic activity without fatigue to show improved functional activity tolerance   ? Time 4   ? Period Weeks   ? Status On-going   ?  ? PT SHORT TERM GOAL #3  ? Title Lumbar pain to be no more than 1/10 with functional task performance   ? Time 4   ? Period Weeks   ? Status On-going   ?  ? PT SHORT TERM GOAL #4  ? Title Will demonstrate improved gait mechanics incluidng better upright posture, reduced scissoring and drifting with gait,  improved foot clearance, and  ability to maintian straight line ambulation for at least 459ft without increased fatigue   ? Time 4   ? Period Weeks   ? Status On-going   ? ?  ?  ? ?  ? ? ? ? PT Long Term Goals - 02/13/22 1447   ? ?  ? PT LONG TERM GOAL #1  ? Title MMT to improve by at least 1 grade in all weak groups   ? Status On-going   ?  ? PT LONG TERM GOAL #2  ? Title Berg score to improve to 52 and DGI score to improve to at least 19   ? Status On-going   ? ?  ?  ? ?  ? ? ? ? ? ? ? ? Plan - 02/15/22 0947   ? ? Clinical Impression Statement Patient arrives with no new issues. He reports that he has a physioball at home. HEP initiated for stretch and strength. Patient particiapted well and reported he felt more loose afterward.   ? Personal Factors and Comorbidities Time since onset of injury/illness/exacerbation;Past/Current Experience   ? Examination-Activity Limitations Locomotion Level;Transfers;Bed Mobility;Squat;Stairs;Lift;Stand   ? Examination-Participation Restrictions Cleaning;Community Activity;Interpersonal Relationship;Shop;Laundry;Volunteer;Pincus Badder Work   ? Stability/Clinical Decision Making Evolving/Moderate complexity   ? Clinical Decision Making Moderate   ? Rehab Potential Good   ? PT Frequency 2x / week   ? PT Duration 6 weeks   ? PT Treatment/Interventions ADLs/Self Care Home Management;Cryotherapy;Electrical Stimulation;Iontophoresis 4mg /ml Dexamethasone;Moist Heat;Traction;Ultrasound;DME Instruction;Gait training;Stair training;Functional mobility training;Therapeutic activities;Therapeutic exercise;Balance training;Neuromuscular re-education;Patient/family education;Manual techniques;Dry needling;Energy conservation;Taping   ? PT Next Visit Plan update HEP with more strengthening activities.   ? PT Home Exercise Plan   ? Consulted and Agree with Plan of Care Patient   ? ?  ?  ? ?  ? ? ?Patient will benefit from skilled therapeutic intervention in order to improve the following deficits and impairments:  Abnormal  gait, Decreased coordination, Difficulty walking, Decreased endurance, Obesity, Decreased activity tolerance, Pain, Decreased balance, Decreased mobility, Decreased strength, Postural dysfunction ? ?Visit Diagnosis: ?Difficulty in walking, not elsewhere classified ? ?Unsteadiness on feet ? ?Other symptoms  and signs involving the nervous system ? ?Muscle weakness (generalized) ? ? ? ? ?Problem List ?Patient Active Problem List  ? Diagnosis Date Noted  ? Parkinsonism (HCC) 01/18/2022  ? Parkinson's disease (HCC) 02/21/2021  ? Mild cognitive impairment 02/21/2021  ? Low back pain 02/21/2021  ? Bilateral low back pain without sciatica 01/25/2021  ? Tremor 01/05/2021  ? Gait abnormality 11/03/2020  ? Left-sided weakness 11/03/2020  ? Chronic left shoulder pain 11/03/2020  ? REM behavioral disorder 08/24/2020  ? Coagulation defect (HCC) 07/06/2020  ? Paroxysmal atrial fibrillation (HCC) 01/06/2019  ? OSA (obstructive sleep apnea) 09/24/2011  ? Chest pain 05/11/2011  ? Hypertension 05/11/2011  ? Obesity 05/11/2011  ? Hyperlipidemia 05/11/2011  ? ? ?Iona Beard, DPT ?02/15/2022, 10:19 AM ? ?[place] ?[facility] ?[address] ?[place], [place], [address] ?Phone: [phone]   Fax:  [phone] ? ?Name: Edwin Gallagher ?MRN: [id number] ?Date of Birth: [birth year] ? ? ? ?

## 2022-02-21 ENCOUNTER — Encounter: Payer: Self-pay | Admitting: Physical Therapy

## 2022-02-21 ENCOUNTER — Ambulatory Visit (INDEPENDENT_AMBULATORY_CARE_PROVIDER_SITE_OTHER): Payer: 59 | Admitting: Podiatry

## 2022-02-21 ENCOUNTER — Ambulatory Visit: Payer: 59 | Admitting: Physical Therapy

## 2022-02-21 ENCOUNTER — Encounter: Payer: Self-pay | Admitting: Podiatry

## 2022-02-21 DIAGNOSIS — E1149 Type 2 diabetes mellitus with other diabetic neurological complication: Secondary | ICD-10-CM

## 2022-02-21 DIAGNOSIS — M6281 Muscle weakness (generalized): Secondary | ICD-10-CM

## 2022-02-21 DIAGNOSIS — E1140 Type 2 diabetes mellitus with diabetic neuropathy, unspecified: Secondary | ICD-10-CM

## 2022-02-21 DIAGNOSIS — R29818 Other symptoms and signs involving the nervous system: Secondary | ICD-10-CM

## 2022-02-21 DIAGNOSIS — M79675 Pain in left toe(s): Secondary | ICD-10-CM

## 2022-02-21 DIAGNOSIS — D689 Coagulation defect, unspecified: Secondary | ICD-10-CM | POA: Diagnosis not present

## 2022-02-21 DIAGNOSIS — B351 Tinea unguium: Secondary | ICD-10-CM | POA: Diagnosis not present

## 2022-02-21 DIAGNOSIS — R2681 Unsteadiness on feet: Secondary | ICD-10-CM

## 2022-02-21 DIAGNOSIS — R262 Difficulty in walking, not elsewhere classified: Secondary | ICD-10-CM | POA: Diagnosis not present

## 2022-02-21 DIAGNOSIS — M79674 Pain in right toe(s): Secondary | ICD-10-CM

## 2022-02-21 NOTE — Therapy (Signed)
[place] ?[facility] ?[address] ?[place], [place], [address] ?Phone: [phone]   Fax:  [phone] ? ?Physical Therapy Treatment ? ?Patient Details  ?Name: Edwin Gallagher ?MRN: [id number] ?Date of Birth: April 17, 1956 ?Referring Provider (PT): Levert FeinsteinYan Yijun ? ? ?Encounter Date: 02/21/2022 ? ? PT End of Session - 02/21/22 1220   ? ? Visit Number 7   ? Date for PT Re-Evaluation 03/20/22   ? Authorization Type Aetna   ? PT Start Time 0930   ? PT Stop Time 1014   ? PT Time Calculation (min) 44 min   ? Activity Tolerance Patient tolerated treatment well   ? Behavior [facility] for tasks assessed/performed   ? ?  ?  ? ?  ? ? ?Past Medical History:  ?Diagnosis Date  ? Abnormal cardiac CT angiography 2006  ? only abnormal with soft plaque in LAD calcium score is0   ? Abnormal LFTs   ? Balance problem   ? CAD (coronary artery disease)   ? a. has seen Dr. Riley KillStuckey in the past and had cardiac CT 2006 with calcium score of 0. He had soft plaque in LAD of about 50%.  He had a normal stress echo in 2012. He also had a normal nuc in 2016.  ? Chest discomfort   ? Diabetes mellitus   ? Forgetfulness   ? Hyperlipidemia   ? Hypertension   ? Obesity   ? OSA (obstructive sleep apnea)   ? Paroxysmal atrial fibrillation (HCC)   ? Tremor   ? ? ?Past Surgical History:  ?Procedure Laterality Date  ? ROTATOR CUFF REPAIR  2009  ? Right  ? TRIGGER FINGER RELEASE  02/12/2012  ? Procedure: MINOR RELEASE TRIGGER FINGER/A-1 PULLEY;  Surgeon: Wyn Forsterobert V Sypher Jr., MD;  Location: [facility];  Service: Orthopedics;  Laterality: Right;  right thumb  ? ? ?There were no vitals filed for this visit. ? ? Subjective Assessment - 02/21/22 0934   ? ? Subjective Patient reports no new issues.   ? Patient Stated Goals have some sense of improvement, sense of normality in terms of walking and balance, improve back discomfort   ? ?  ?  ? ?  ? ? ? ? ? ? ? ? ? ? ? ? ? ? ? ? ? ? ? ? [facility] Adult PT  Treatment/Exercise - 02/21/22 0001   ? ?  ? Lumbar Exercises: Stretches  ? Passive Hamstring Stretch Right;Left;3 reps;10 seconds   In sitting  ? Other Lumbar Stretch Exercise Physioball stretch for low back x 10 reps   ?  ? Lumbar Exercises: Aerobic  ? Nustep L5 x 5 minutes   ?  ? Lumbar Exercises: Seated  ? Other Seated Lumbar Exercises Ext Rot against G Tband 2 x 10 reps   ?  ? Knee/Hip Exercises: Machines for Strengthening  ? Other Machine Shoulder ext and rows against 15# resistance with pulleys, 2 x 10 reps   ?  ? Knee/Hip Exercises: Standing  ? Hip Abduction Stengthening;Both;2 sets;4 sets;5 reps;Knee straight   ? Abduction Limitations Green tband   ? Hip Extension Stengthening;Both;4 sets;5 reps   ? Extension Limitations Green Tband   ? ?  ?  ? ?  ? ? ? ? ? ? ? ? ? ? PT Education - 02/21/22 1015   ? ? Education Details Updated HEP   ? Person(s) Educated Patient   ? Methods Explanation;Demonstration;Handout   ? Comprehension Returned  demonstration;Verbalized understanding   ? ?  ?  ? ?  ? ? ? PT Short Term Goals - 02/21/22 1003   ? ?  ? PT SHORT TERM GOAL #1  ? Title Will be independent with appropriate progressive HEP   ? Time 4   ? Period Weeks   ? Status Achieved   ? Target Date 02/20/22   ?  ? PT SHORT TERM GOAL #2  ? Title Will be able to tolerate 10-15 minutes of seated aerobic activity without fatigue to show improved functional activity tolerance   ? Baseline 10   ? Time 1   ? Period Weeks   ? Status On-going   ?  ? PT SHORT TERM GOAL #3  ? Title Lumbar pain to be no more than 1/10 with functional task performance   ? Baseline Patient reports improved pain, but it still rises iwth static standing.   ? Time 1   ? Period Weeks   ? Status On-going   ?  ? PT SHORT TERM GOAL #4  ? Title Will demonstrate improved gait mechanics incluidng better upright posture, reduced scissoring and drifting with gait,  improved foot clearance, and ability to maintian straight line ambulation for at least 460ft without  increased fatigue   ? Baseline Improved upright stance at times, but inconsistent.   ? Time 1   ? Period Weeks   ? Status On-going   ? ?  ?  ? ?  ? ? ? ? PT Long Term Goals - 02/13/22 1447   ? ?  ? PT LONG TERM GOAL #1  ? Title MMT to improve by at least 1 grade in all weak groups   ? Status On-going   ?  ? PT LONG TERM GOAL #2  ? Title Berg score to improve to 52 and DGI score to improve to at least 19   ? Status On-going   ? ?  ?  ? ?  ? ? ? ? ? ? ? ? Plan - 02/21/22 1006   ? ? Clinical Impression Statement No new issues. Patient fatigued today, but participated well. Performed some stretching for low back F/B trunk and hip stability. He fatigued fairly quickly with the strengthening, reports this happens at home as well, so he paces himself and performs part of his HEP each day.   ? Personal Factors and Comorbidities Time since onset of injury/illness/exacerbation;Past/Current Experience   ? Examination-Activity Limitations Locomotion Level;Transfers;Bed Mobility;Squat;Stairs;Lift;Stand   ? Examination-Participation Restrictions Cleaning;Community Activity;Interpersonal Relationship;Shop;Laundry;Volunteer;Pincus Badder Work   ? Stability/Clinical Decision Making Evolving/Moderate complexity   ? Clinical Decision Making Moderate   ? Rehab Potential Good   ? PT Frequency 2x / week   ? PT Duration 6 weeks   ? PT Treatment/Interventions ADLs/Self Care Home Management;Cryotherapy;Electrical Stimulation;Iontophoresis 4mg /ml Dexamethasone;Moist Heat;Traction;Ultrasound;DME Instruction;Gait training;Stair training;Functional mobility training;Therapeutic activities;Therapeutic exercise;Balance training;Neuromuscular re-education;Patient/family education;Manual techniques;Dry needling;Energy conservation;Taping   ? PT Home Exercise Plan   ? ?  ?  ? ?  ? ? ?Patient will benefit from skilled therapeutic intervention in order to improve the following deficits and impairments:  Abnormal gait, Decreased coordination,  Difficulty walking, Decreased endurance, Obesity, Decreased activity tolerance, Pain, Decreased balance, Decreased mobility, Decreased strength, Postural dysfunction ? ?Visit Diagnosis: ?Difficulty in walking, not elsewhere classified ? ?Unsteadiness on feet ? ?Other symptoms and signs involving the nervous system ? ?Muscle weakness (generalized) ? ? ? ? ?Problem List ?Patient Active Problem List  ? Diagnosis Date Noted  ? Parkinsonism (HCC)  01/18/2022  ? Parkinson's disease (HCC) 02/21/2021  ? Mild cognitive impairment 02/21/2021  ? Low back pain 02/21/2021  ? Bilateral low back pain without sciatica 01/25/2021  ? Tremor 01/05/2021  ? Gait abnormality 11/03/2020  ? Left-sided weakness 11/03/2020  ? Chronic left shoulder pain 11/03/2020  ? REM behavioral disorder 08/24/2020  ? Coagulation defect (HCC) 07/06/2020  ? Paroxysmal atrial fibrillation (HCC) 01/06/2019  ? OSA (obstructive sleep apnea) 09/24/2011  ? Chest pain 05/11/2011  ? Hypertension 05/11/2011  ? Obesity 05/11/2011  ? Hyperlipidemia 05/11/2011  ? ? ?Iona Beard, DPT ?02/21/2022, 12:21 PM ? ?[place] ?[facility] ?[address] ?[place], [place], [address] ?Phone: [phone]   Fax:  [phone] ? ?Name: Khyre Germond ?MRN: [id number] ?Date of Birth: [birth year] ? ? ? ?

## 2022-02-21 NOTE — Patient Instructions (Signed)
Access Code: [email or web address] ?URL: [email or web address] ?Date: 02/21/2022 ?Prepared by: Oley Balm ? ?Exercises ?- Supine Hamstring Stretch with Strap  - 1 x daily - 7 x weekly - 1 reps - 30 hold ?- Supine ITB Stretch with Strap  - 1 x daily - 7 x weekly - 1 reps - 30 hold ?- Hip Adductors and Hamstring Stretch with Strap  - 1 x daily - 7 x weekly - 1 reps - 30 hold ?- Supine ITB Stretch  - 1 x daily - 7 x weekly - 1 reps - 30 hold ?- Single Knee to Chest Stretch  - 1 x daily - 7 x weekly - 3 reps - 10 hold ?- Supine Double Knee to Chest  - 1 x daily - 7 x weekly - 3 reps - 10 hold ?- Bridge with Arms at [employer] and Feet on [employer]  - 1 x daily - 7 x weekly - 2 sets - 10 reps ?- Bridge on ball and roll it side to side.  - 1 x daily - 7 x weekly - 2 sets - 10 reps ?- Sit to Stand Without Arm Support  - 1 x daily - 7 x weekly - 2 sets - 10 reps ?- Shoulder Extension with Resistance  - 1 x daily - 7 x weekly - 2 sets - 10 reps ?- Standing Bilateral Low Shoulder Row with Anchored Resistance  - 1 x daily - 7 x weekly - 2 sets - 10 reps ?- Shoulder External Rotation and Scapular Retraction with Resistance  - 1 x daily - 7 x weekly - 2 sets - 10 reps ?- Standing Hip Abduction with Resistance at Ankles and Counter Support  - 1 x daily - 7 x weekly - 2 sets - 10 reps ?

## 2022-02-21 NOTE — Progress Notes (Signed)
This patient returns to my office for at risk foot care.  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  This patient is unable to cut nails himself since the patient cannot reach his nails.These nails are painful walking and wearing shoes.  This patient presents for at risk foot care today.  General Appearance  Alert, conversant and in no acute stress.  Vascular  Dorsalis pedis and posterior tibial  pulses are palpable  bilaterally.  Capillary return is within normal limits  bilaterally. Temperature is within normal limits  bilaterally.  Neurologic  Senn-Weinstein monofilament wire test within normal limits  bilaterally. Muscle power within normal limits bilaterally.  Nails Thick disfigured discolored nails with subungual debris  from hallux to fifth toes bilaterally. No evidence of bacterial infection or drainage bilaterally.  Orthopedic  No limitations of motion  feet .  No crepitus or effusions noted.  No bony pathology or digital deformities noted. Mild HAV  B/L.  ADV 5th digit  B/L.  Skin  normotropic skin with no porokeratosis noted bilaterally.  No signs of infections or ulcers noted.     Onychomycosis  Pain in right toes  Pain in left toes  Consent was obtained for treatment procedures.   Mechanical debridement of nails 1-5  bilaterally performed with a nail nipper.  Filed with dremel without incident.    Return office visit     3 months                 Told patient to return for periodic foot care and evaluation due to potential at risk complications.     DPM  

## 2022-02-23 ENCOUNTER — Ambulatory Visit: Payer: 59 | Admitting: Physical Therapy

## 2022-02-27 ENCOUNTER — Ambulatory Visit: Payer: 59 | Admitting: Physical Therapy

## 2022-02-27 ENCOUNTER — Encounter: Payer: Self-pay | Admitting: Physical Therapy

## 2022-02-27 DIAGNOSIS — R2681 Unsteadiness on feet: Secondary | ICD-10-CM

## 2022-02-27 DIAGNOSIS — R262 Difficulty in walking, not elsewhere classified: Secondary | ICD-10-CM | POA: Diagnosis not present

## 2022-02-27 DIAGNOSIS — M6281 Muscle weakness (generalized): Secondary | ICD-10-CM

## 2022-02-27 DIAGNOSIS — R29818 Other symptoms and signs involving the nervous system: Secondary | ICD-10-CM

## 2022-02-27 NOTE — Therapy (Signed)
[facility] ?[place], [place], [address] ?Phone: [phone]   Fax:  [phone] ? ?Physical Therapy Treatment ? ?Patient Details  ?Name: Edwin Gallagher ?MRN: [id number] ?Date of Birth: 1955/12/03 ?Referring Provider (PT): Marcial Pacas ? ? ?Encounter Date: 02/27/2022 ? ? PT End of Session - 02/27/22 1148   ? ? Visit Number 8   ? Date for PT Re-Evaluation 03/20/22   ? Authorization Type Aetna   ? PT Start Time 6962   ? PT Stop Time 1013   ? PT Time Calculation (min) 41 min   ? Activity Tolerance Patient tolerated treatment well   ? Behavior During Therapy [facility] for tasks assessed/performed   ? ?  ?  ? ?  ? ? ?Past Medical History:  ?Diagnosis Date  ? Abnormal cardiac CT angiography 2006  ? only abnormal with soft plaque in LAD calcium score is0   ? Abnormal LFTs   ? Balance problem   ? CAD (coronary artery disease)   ? a. has seen Dr. Lia Foyer in the past and had cardiac CT 2006 with calcium score of 0. He had soft plaque in LAD of about 50%.  He had a normal stress echo in 2012. He also had a normal nuc in 2016.  ? Chest discomfort   ? Diabetes mellitus   ? Forgetfulness   ? Hyperlipidemia   ? Hypertension   ? Obesity   ? OSA (obstructive sleep apnea)   ? Paroxysmal atrial fibrillation (HCC)   ? Tremor   ? ? ?Past Surgical History:  ?Procedure Laterality Date  ? ROTATOR CUFF REPAIR  2009  ? Right  ? TRIGGER FINGER RELEASE  02/12/2012  ? Procedure: MINOR RELEASE TRIGGER FINGER/A-1 PULLEY;  Surgeon: Cammie Sickle., MD;  Location: [place];  Service: Orthopedics;  Laterality: Right;  right thumb  ? ? ?There were no vitals filed for this visit. ? ? Subjective Assessment - 02/27/22 0933   ? ? Subjective Patient reports no new issues. He missed his previous appointment due to oversleeping after a very busy day the day before. He reports no changes, has had difficulty performing his HEP and his R knee has had some pain. It has not impeded him, but he  is aware.   ? Patient Stated Goals have some sense of improvement, sense of normality in terms of walking and balance, improve back discomfort   ? Currently in Pain? No/denies   ? ?  ?  ? ?  ? ? ? ? ? ? ? ? ? ? ? ? ? ? ? ? ? ? ? ? [place] Adult PT Treatment/Exercise - 02/27/22 0001   ? ?  ? Ambulation/Gait  ? Gait Comments Walked x 120' whtile holding 4# ball in BUE and emphasizing upright posture. He walked with imporved posutre, decreased limp. Reported back fatigue after standing upright.   ?  ? Lumbar Exercises: Stretches  ? Hip Flexor Stretch Right;Left;1 rep;60 seconds   ? Hip Flexor Stretch Limitations overpressure from PT   ?  ? Lumbar Exercises: Aerobic  ? Nustep L5 x 5 minutes   ?  ? Lumbar Exercises: Standing  ? Other Standing Lumbar Exercises Mini squats against the wall while maintaining PPT, x 10 reps. Paiten treported R knee pain at the end of exercise.   ?  ? Lumbar Exercises: Seated  ? Sit to Stand 10 reps   ? Sit to Stand Limitations no UE   ?  ?  Lumbar Exercises: Supine  ? Pelvic Tilt 10 reps;5 seconds   ? Bridge Non-compliant;5 reps;2 seconds   ? Bridge Limitations Whjile maintaining PPT, he had difficulty lifting hips off mat and also not holding breath.   ? ?  ?  ? ?  ? ? ? ? ? ? ? ? ? ? ? ? PT Short Term Goals - 02/27/22 1146   ? ?  ? PT SHORT TERM GOAL #1  ? Title Will be independent with appropriate progressive HEP   ? Time 4   ? Period Weeks   ? Status Achieved   ? Target Date 02/20/22   ?  ? PT SHORT TERM GOAL #2  ? Title Will be able to tolerate 10-15 minutes of seated aerobic activity without fatigue to show improved functional activity tolerance   ? Baseline 10   ? Time 1   ? Period Weeks   ? Status Partially Met   ?  ? PT SHORT TERM GOAL #3  ? Title Lumbar pain to be no more than 1/10 with functional task performance   ? Baseline Patient reports improved pain, but it still rises with static standing or walking   ? Time 1   ? Period Weeks   ? Status On-going   ?  ? PT SHORT TERM GOAL #4  ?  Title Will demonstrate improved gait mechanics incluidng better upright posture, reduced scissoring and drifting with gait,  improved foot clearance, and ability to maintian straight line ambulation for at least 410f without increased fatigue   ? Baseline Improved upright stance at times, does not tolerate 400'.   ? Time 1   ? Period Weeks   ? Status On-going   ? ?  ?  ? ?  ? ? ? ? PT Long Term Goals - 02/13/22 1447   ? ?  ? PT LONG TERM GOAL #1  ? Title MMT to improve by at least 1 grade in all weak groups   ? Status On-going   ?  ? PT LONG TERM GOAL #2  ? Title Berg score to improve to 52 and DGI score to improve to at least 19   ? Status On-going   ? ?  ?  ? ?  ? ? ? ? ? ? ? ? Plan - 02/27/22 1012   ? ? Personal Factors and Comorbidities Time since onset of injury/illness/exacerbation;Past/Current Experience   ? Examination-Activity Limitations Locomotion Level;Transfers;Bed Mobility;Squat;Stairs;Lift;Stand   ? Examination-Participation Restrictions Cleaning;Community Activity;Interpersonal Relationship;Shop;Laundry;Volunteer;[employer]alla LeaverWork   ? Stability/Clinical Decision Making Evolving/Moderate complexity   ? Rehab Potential Good   ? PT Frequency 2x / week   ? PT Duration 6 weeks   ? PT Treatment/Interventions ADLs/Self Care Home Management;Cryotherapy;Electrical Stimulation;Iontophoresis 484mml Dexamethasone;Moist Heat;Traction;Ultrasound;DME Instruction;Gait training;Stair training;Functional mobility training;Therapeutic activities;Therapeutic exercise;Balance training;Neuromuscular re-education;Patient/family education;Manual techniques;Dry needling;Energy conservation;Taping   ? PT Next Visit Plan Patient reports back fatigue when he atempts to stand upright-work on back stabilizing.   ? PT [place] ? Consulted and Agree with Plan of Care Patient   ? ?  ?  ? ?  ? ? ?Patient will benefit from skilled therapeutic intervention in order to improve the following deficits and impairments:   Abnormal gait, Decreased coordination, Difficulty walking, Decreased endurance, Obesity, Decreased activity tolerance, Pain, Decreased balance, Decreased mobility, Decreased strength, Postural dysfunction ? ?Visit Diagnosis: ?Difficulty in walking, not elsewhere classified ? ?Unsteadiness on feet ? ?Other symptoms and signs involving the nervous system ? ?Muscle  weakness (generalized) ? ? ? ? ?Problem List ?Patient Active Problem List  ? Diagnosis Date Noted  ? Parkinsonism (HCC) 01/18/2022  ? Parkinson's disease (HCC) 02/21/2021  ? Mild cognitive impairment 02/21/2021  ? Low back pain 02/21/2021  ? Bilateral low back pain without sciatica 01/25/2021  ? Tremor 01/05/2021  ? Gait abnormality 11/03/2020  ? Left-sided weakness 11/03/2020  ? Chronic left shoulder pain 11/03/2020  ? REM behavioral disorder 08/24/2020  ? Coagulation defect (HCC) 07/06/2020  ? Paroxysmal atrial fibrillation (HCC) 01/06/2019  ? OSA (obstructive sleep apnea) 09/24/2011  ? Chest pain 05/11/2011  ? Hypertension 05/11/2011  ? Obesity 05/11/2011  ? Hyperlipidemia 05/11/2011  ? ? ? M , DPT ?02/27/2022, 11:49 AM ? ?[place] [facility] ?[address] ?[place], [place], [address] ?Phone: [phone]   Fax:  [phone] ? ?Name: Shad Ramo ?MRN: [id number] ?Date of Birth: 11/21/1955 ? ? ? ?

## 2022-03-01 ENCOUNTER — Ambulatory Visit: Payer: 59 | Admitting: Physical Therapy

## 2022-03-06 ENCOUNTER — Encounter: Payer: Self-pay | Admitting: Physical Therapy

## 2022-03-06 ENCOUNTER — Ambulatory Visit: Payer: 59 | Admitting: Physical Therapy

## 2022-03-06 DIAGNOSIS — R2681 Unsteadiness on feet: Secondary | ICD-10-CM

## 2022-03-06 DIAGNOSIS — R262 Difficulty in walking, not elsewhere classified: Secondary | ICD-10-CM

## 2022-03-06 DIAGNOSIS — M6281 Muscle weakness (generalized): Secondary | ICD-10-CM

## 2022-03-06 DIAGNOSIS — R29818 Other symptoms and signs involving the nervous system: Secondary | ICD-10-CM

## 2022-03-06 NOTE — Therapy (Signed)
?[place] ?[address] ?[place], [place], [address] ?Phone: [phone]   Fax:  [phone] ? ?Physical Therapy Treatment ? ?Patient Details  ?Name: Edwin Gallagher ?MRN: [id number] ?Date of Birth: 20-Mar-1956 ?Referring Provider (PT): Marcial Pacas ? ? ?Encounter Date: 03/06/2022 ? ? PT End of Session - 03/06/22 1010   ? ? Visit Number 9   ? Date for PT Re-Evaluation 03/20/22   ? PT Start Time [phone]   ? PT Stop Time 1011   ? PT Time Calculation (min) 40 min   ? Activity Tolerance Patient tolerated treatment well   ? Behavior During Therapy [facility] for tasks assessed/performed   ? ?  ?  ? ?  ? ? ?Past Medical History:  ?Diagnosis Date  ? Abnormal cardiac CT angiography 2006  ? only abnormal with soft plaque in LAD calcium score is0   ? Abnormal LFTs   ? Balance problem   ? CAD (coronary artery disease)   ? a. has seen Dr. Lia Foyer in the past and had cardiac CT 2006 with calcium score of 0. He had soft plaque in LAD of about 50%.  He had a normal stress echo in 2012. He also had a normal nuc in 2016.  ? Chest discomfort   ? Diabetes mellitus   ? Forgetfulness   ? Hyperlipidemia   ? Hypertension   ? Obesity   ? OSA (obstructive sleep apnea)   ? Paroxysmal atrial fibrillation (HCC)   ? Tremor   ? ? ?Past Surgical History:  ?Procedure Laterality Date  ? ROTATOR CUFF REPAIR  2009  ? Right  ? TRIGGER FINGER RELEASE  02/12/2012  ? Procedure: MINOR RELEASE TRIGGER FINGER/A-1 PULLEY;  Surgeon: Cammie Sickle., MD;  Location: [place];  Service: Orthopedics;  Laterality: Right;  right thumb  ? ? ?There were no vitals filed for this visit. ? ? Subjective Assessment - 03/06/22 0935   ? ? Subjective Patient reports no new issues. He arrives walking with a slightly more upright posture.   ? Currently in Pain? No/denies   ? ?  ?  ? ?  ? ? ? ? ? ? ? ? ? ? ? ? ? ? ? ? ? ? ? ? [place] Adult PT Treatment/Exercise - 03/06/22 0001   ? ?  ? Lumbar Exercises: Stretches  ? Hip Flexor  Stretch Right;Left;1 rep;60 seconds   ? Hip Flexor Stretch Limitations slow flexion/extension at knee throughout with dep breathing for relaxation.   ?  ? Lumbar Exercises: Aerobic  ? UBE (Upper Arm Bike) L1.5 , 3 min forward/3 min back   ?  ? Lumbar Exercises: Seated  ? Other Seated Lumbar Exercises Seated pelvic tilts on physiodisc,   ?  ? Knee/Hip Exercises: Machines for Strengthening  ? Other Machine Shoulder ext and rows against 15# resistance with pulleys, 2 x 10 reps   ? ?  ?  ? ?  ? ? ? ? ? ? ? ? ? ? ? ? PT Short Term Goals - 03/06/22 0946   ? ?  ? PT SHORT TERM GOAL #1  ? Title Will be independent with appropriate progressive HEP   ?  ? PT SHORT TERM GOAL #2  ? Title Will be able to tolerate 10-15 minutes of seated aerobic activity without fatigue to show improved functional activity tolerance   ? Baseline 10   ? Time 1   ? Period Weeks   ?  Status On-going   ?  ? PT SHORT TERM GOAL #3  ? Title Lumbar pain to be no more than 1/10 with functional task performance   ? Baseline Decreased frequency, but still rises up to 6/10   ? Time 1   ? Period Weeks   ? Status On-going   ? ?  ?  ? ?  ? ? ? ? PT Long Term Goals - 02/13/22 1447   ? ?  ? PT LONG TERM GOAL #1  ? Title MMT to improve by at least 1 grade in all weak groups   ? Status On-going   ?  ? PT LONG TERM GOAL #2  ? Title Berg score to improve to 52 and DGI score to improve to at least 19   ? Status On-going   ? ?  ?  ? ?  ? ? ? ? ? ? ? ? Plan - 03/06/22 1003   ? ? Clinical Impression Statement Patient reports no new issues. Treatment continued to focus on stretching, trunk stabilization, and postural control. Patient with improved posture and improved activity tolerance today.   ? Personal Factors and Comorbidities Time since onset of injury/illness/exacerbation;Past/Current Experience   ? Examination-Activity Limitations Locomotion Level;Transfers;Bed Mobility;Squat;Stairs;Lift;Stand   ? Examination-Participation Restrictions Cleaning;Community  Activity;Interpersonal Relationship;Shop;Laundry;Volunteer;Valla Leaver Work   ? Stability/Clinical Decision Making Evolving/Moderate complexity   ? Clinical Decision Making Moderate   ? Rehab Potential Good   ? PT Frequency 2x / week   ? PT Duration 6 weeks   5w  ? PT Treatment/Interventions ADLs/Self Care Home Management;Cryotherapy;Electrical Stimulation;Iontophoresis 4mg /ml Dexamethasone;Moist Heat;Traction;Ultrasound;DME Instruction;Gait training;Stair training;Functional mobility training;Therapeutic activities;Therapeutic exercise;Balance training;Neuromuscular re-education;Patient/family education;Manual techniques;Dry needling;Energy conservation;Taping   ? PT Next Visit Plan Continue current POC as patient appears to be responding.   ? PT [place]   ? Consulted and Agree with Plan of Care Patient   ? ?  ?  ? ?  ? ? ?Patient will benefit from skilled therapeutic intervention in order to improve the following deficits and impairments:  Abnormal gait, Decreased coordination, Difficulty walking, Decreased endurance, Obesity, Decreased activity tolerance, Pain, Decreased balance, Decreased mobility, Decreased strength, Postural dysfunction ? ?Visit Diagnosis: ?Difficulty in walking, not elsewhere classified ? ?Unsteadiness on feet ? ?Other symptoms and signs involving the nervous system ? ?Muscle weakness (generalized) ? ? ? ? ?Problem List ?Patient Active Problem List  ? Diagnosis Date Noted  ? Parkinsonism ([place]) 01/18/2022  ? Parkinson's disease ([place]) 02/21/2021  ? Mild cognitive impairment 02/21/2021  ? Low back pain 02/21/2021  ? Bilateral low back pain without sciatica 01/25/2021  ? Tremor 01/05/2021  ? Gait abnormality 11/03/2020  ? Left-sided weakness 11/03/2020  ? Chronic left shoulder pain 11/03/2020  ? REM behavioral disorder 08/24/2020  ? Coagulation defect ([place]) 07/06/2020  ? Paroxysmal atrial fibrillation ([place]) 01/06/2019  ? OSA (obstructive sleep apnea) 09/24/2011  ? Chest pain  05/11/2011  ? Hypertension 05/11/2011  ? Obesity 05/11/2011  ? Hyperlipidemia 05/11/2011  ? ? ?Marcelina Morel, DPT ?03/06/2022, 10:14 AM ? ?[facility] ?[place] ?[address] ?[place], [place], [address] ?Phone: [phone]   Fax:  [phone] ? ?Name: Cheick Vert ?MRN: [id number] ?Date of Birth: 1956-07-13 ? ? ? ?

## 2022-03-08 ENCOUNTER — Ambulatory Visit: Payer: 59 | Admitting: Physical Therapy

## 2022-03-08 ENCOUNTER — Encounter: Payer: Self-pay | Admitting: Physical Therapy

## 2022-03-08 DIAGNOSIS — M6281 Muscle weakness (generalized): Secondary | ICD-10-CM

## 2022-03-08 DIAGNOSIS — R262 Difficulty in walking, not elsewhere classified: Secondary | ICD-10-CM

## 2022-03-08 DIAGNOSIS — R29818 Other symptoms and signs involving the nervous system: Secondary | ICD-10-CM

## 2022-03-08 DIAGNOSIS — R2681 Unsteadiness on feet: Secondary | ICD-10-CM

## 2022-03-08 NOTE — Therapy (Signed)
[place] ?[place] ?[address] ?[place], [place], [address] ?Phone: [phone]   Fax:  [phone] ? ?Physical Therapy Treatment ? ?Patient Details  ?Name: Edwin Gallagher ?MRN: [id number] ?Date of Birth: 01-01-1956 ?Referring Provider (PT): Marcial Pacas ? ? ?Encounter Date: 03/08/2022 ? ? PT End of Session - 03/08/22 1353   ? ? Visit Number 10   ? Number of Visits 18   ? Date for PT Re-Evaluation 04/05/22   ? Authorization Type Aetna   ? Authorization Time Period 01/23/22 to 03/20/22; extended to 5/25   ? PT Start Time 1316   ? PT Stop Time 1355   ? PT Time Calculation (min) 39 min   ? Activity Tolerance Patient tolerated treatment well   ? Behavior During Therapy [facility] for tasks assessed/performed   ? ?  ?  ? ?  ? ? ?Past Medical History:  ?Diagnosis Date  ? Abnormal cardiac CT angiography 2006  ? only abnormal with soft plaque in LAD calcium score is0   ? Abnormal LFTs   ? Balance problem   ? CAD (coronary artery disease)   ? a. has seen Dr. Lia Foyer in the past and had cardiac CT 2006 with calcium score of 0. He had soft plaque in LAD of about 50%.  He had a normal stress echo in 2012. He also had a normal nuc in 2016.  ? Chest discomfort   ? Diabetes mellitus   ? Forgetfulness   ? Hyperlipidemia   ? Hypertension   ? Obesity   ? OSA (obstructive sleep apnea)   ? Paroxysmal atrial fibrillation (HCC)   ? Tremor   ? ? ?Past Surgical History:  ?Procedure Laterality Date  ? ROTATOR CUFF REPAIR  2009  ? Right  ? TRIGGER FINGER RELEASE  02/12/2012  ? Procedure: MINOR RELEASE TRIGGER FINGER/A-1 PULLEY;  Surgeon: Cammie Sickle., MD;  Location: [facility];  Service: Orthopedics;  Laterality: Right;  right thumb  ? ? ?There were no vitals filed for this visit. ? ? Subjective Assessment - 03/08/22 1317   ? ? Subjective I feel like I'm getting better, I feel like I still need to work on being my endurance more with more exercises. No falls. The back has been feeling not so  bad, its not been problematic, and it hasn't had as much discomfort as in the past. I'd put myself at 40% in terms of what I'd like to be able to do, I'd like to be able to have more flexibility and mobility in general for normal activities and to get around better.   ? Patient Stated Goals have some sense of improvement, sense of normality in terms of walking and balance, improve back discomfort   ? Currently in Pain? No/denies   ? ?  ?  ? ?  ? ? ? ? ? [facility] PT Assessment - 03/08/22 0001   ? ?  ? Assessment  ? Medical Diagnosis parkinsonism   ? Referring Provider (PT) Marcial Pacas   ? Onset Date/Surgical Date --   within the past year  ? Hand Dominance Right   ? Next MD Visit has regular appts with Dr. Krista Blue every 3-6 months   ? Prior Therapy PT in the past for balance   ?  ? Precautions  ? Precautions Fall   ?  ? Restrictions  ? Weight Bearing Restrictions No   ?  ? Balance Screen  ? Has the patient fallen  in the past 6 months No   ? Has the patient had a decrease in activity level because of a fear of falling?  Yes   ? Is the patient reluctant to leave their home because of a fear of falling?  No   ?  ? Home Environment  ? Living Environment Private residence   ?  ? Prior Function  ? Level of Independence Independent;Independent with basic ADLs   ? Leisure "don't do much, I stay pretty busy with family"   ?  ? Strength  ? Right Hip Flexion 4-/5   ? Right Hip ABduction 4/5   ? Left Hip Flexion 3-/5   ? Left Hip ABduction 4+/5   ? Right Knee Flexion 4-/5   ? Right Knee Extension 5/5   ? Left Knee Flexion 3+/5   ? Left Knee Extension 4+/5   ? Right Ankle Dorsiflexion 5/5   ? Left Ankle Dorsiflexion 5/5   ?  ? Transfers  ? Five time sit to stand comments  15.2   ?  ? Berg Balance Test  ? Sit to Stand Able to stand without using hands and stabilize independently   ? Standing Unsupported Able to stand safely 2 minutes   ? Sitting with Back Unsupported but Feet Supported on Floor or Stool Able to sit safely and securely 2  minutes   ? Stand to Sit Sits safely with minimal use of hands   ? Transfers Able to transfer safely, minor use of hands   ? Standing Unsupported with Eyes Closed Able to stand 10 seconds safely   ? Standing Unsupported with Feet Together Able to place feet together independently and stand 1 minute safely   ? From Standing, Reach Forward with Outstretched Arm Can reach confidently >25 cm (10")   ? From Standing Position, Pick up Object from [place] to pick up shoe safely and easily   ? From Standing Position, Turn to Look Behind Over each Shoulder Looks behind from both sides and weight shifts well   ? Turn 360 Degrees Able to turn 360 degrees safely in 4 seconds or less   ? Standing Unsupported, Alternately Place Feet on Step/Stool Able to stand independently and safely and complete 8 steps in 20 seconds   ? Standing Unsupported, One Foot in Front Needs help to step but can hold 15 seconds   ? Standing on One Leg Able to lift leg independently and hold equal to or more than 3 seconds   ? Total Score 51   ?  ? Dynamic Gait Index  ? Level Surface Normal   ? Change in Gait Speed Mild Impairment   ? Gait with Horizontal Head Turns Normal   ? Gait with Vertical Head Turns Normal   ? Gait and Pivot Turn Mild Impairment   ? Step Over Obstacle Mild Impairment   ? Step Around Obstacles Moderate Impairment   ? Steps Mild Impairment   ? Total Score 18   ? ?  ?  ? ?  ? ? ? ? ? ? ? ? ? ? ? ? ? ? ? ? [place] Adult PT Treatment/Exercise - 03/08/22 0001   ? ?  ? Knee/Hip Exercises: Standing  ? Forward Step Up Both;1 set;15 reps   ? Forward Step Up Limitations 6 inch step U UE support   ? Functional Squat 1 set;10 reps   in front of chair  ? ?  ?  ? ?  ? ? ? ? ? ? ? ? ? ?  PT Education - 03/08/22 1353   ? ? Education Details exam findings, POC, goal review   ? Person(s) Educated Patient   ? Methods Explanation   ? Comprehension Verbalized understanding   ? ?  ?  ? ?  ? ? ? PT Short Term Goals - 03/08/22 1342   ? ?  ? PT SHORT TERM  GOAL #1  ? Title Will be independent with appropriate progressive HEP   ? Baseline 4/27- slow but going, able to get them somewhat, I have to rotate through them   ? Time 4   ? Period Weeks   ? Status Achieved   ? Target Date 02/20/22   ?  ? PT SHORT TERM GOAL #2  ? Title Will be able to tolerate 10-15 minutes of seated aerobic activity without fatigue to show improved functional activity tolerance   ? Time 4   ? Period Weeks   ? Status Partially Met   ?  ? PT SHORT TERM GOAL #3  ? Title Lumbar pain to be no more than 1/10 with functional task performance   ? Baseline 4/27- 3/10, improving   ? Time 4   ? Period Weeks   ? Status On-going   ?  ? PT SHORT TERM GOAL #4  ? Title Will demonstrate improved gait mechanics incluidng better upright posture, reduced scissoring and drifting with gait,  improved foot clearance, and ability to maintian straight line ambulation for at least 449f without increased fatigue   ? Baseline 4/27- still bent over, scissoring and drifting improved, doesn't tolerate 406fyet   ? Time 4   ? Period Weeks   ? Status Partially Met   ? ?  ?  ? ?  ? ? ? ? PT Long Term Goals - 03/08/22 1347   ? ?  ? PT LONG TERM GOAL #1  ? Title MMT to improve by at least 1 grade in all weak groups   ? Baseline 4/27- improving   ? Time 8   ? Period Weeks   ? Status On-going   ?  ? PT LONG TERM GOAL #2  ? Title Berg score to improve to 52 and DGI score to improve to at least 19   ? Baseline 4/27- berg 51, DGI 18   ? Time 8   ? Period Weeks   ? Status On-going   ?  ? PT LONG TERM GOAL #3  ? Title Will be able to tolerate at least 15-20 minutes of standing activities and exercise without fatigue or increase in back pain to show improved functional activity tolerance   ? Time 8   ? Period Weeks   ? Status On-going   ?  ? PT LONG TERM GOAL #4  ? Title Will be independent in appropriate gym based aerobic exercise program to maintain functional gains and to help slow progression of parkinsonian symptoms   ? Time 8   ?  Period Weeks   ? Status On-going   ? ?  ?  ? ?  ? ? ? ? ? ? ? ? Plan - 03/08/22 1355   ? ? Clinical Impression Statement Helfman arrives feeling well, tells me PT is helping him. His back is feeling bette

## 2022-03-12 ENCOUNTER — Telehealth: Payer: Self-pay | Admitting: Neurology

## 2022-03-14 ENCOUNTER — Encounter: Payer: Self-pay | Admitting: Physical Therapy

## 2022-03-14 ENCOUNTER — Ambulatory Visit: Payer: 59 | Attending: Neurology | Admitting: Physical Therapy

## 2022-03-14 DIAGNOSIS — R2681 Unsteadiness on feet: Secondary | ICD-10-CM | POA: Insufficient documentation

## 2022-03-14 DIAGNOSIS — R29818 Other symptoms and signs involving the nervous system: Secondary | ICD-10-CM | POA: Insufficient documentation

## 2022-03-14 DIAGNOSIS — M6281 Muscle weakness (generalized): Secondary | ICD-10-CM | POA: Insufficient documentation

## 2022-03-14 DIAGNOSIS — R262 Difficulty in walking, not elsewhere classified: Secondary | ICD-10-CM | POA: Insufficient documentation

## 2022-03-14 NOTE — Telephone Encounter (Signed)
Error

## 2022-03-14 NOTE — Therapy (Signed)
[facility] ?[place] ?[address] ?[place], [place], [address] ?Phone: [phone]   Fax:  [phone] ? ?Physical Therapy Treatment ? ?Patient Details  ?Name: Edwin Gallagher ?MRN: [id number] ?Date of Birth: [birth year] ?Referring Provider (PT): Marcial Pacas ? ? ?Encounter Date: 03/14/2022 ? ? PT End of Session - 03/14/22 1011   ? ? Visit Number 11   ? Number of Visits 18   ? Date for PT Re-Evaluation 04/05/22   ? Authorization Type Aetna   ? Authorization Time Period 01/23/22 to 03/20/22; extended to 5/25   ? PT Start Time 0930   ? PT Stop Time 1006   ? PT Time Calculation (min) 36 min   ? Activity Tolerance Patient tolerated treatment well   ? Behavior During Therapy [facility] for tasks assessed/performed   ? ?  ?  ? ?  ? ? ?Past Medical History:  ?Diagnosis Date  ? Abnormal cardiac CT angiography 2006  ? only abnormal with soft plaque in LAD calcium score is0   ? Abnormal LFTs   ? Balance problem   ? CAD (coronary artery disease)   ? a. has seen Dr. Lia Foyer in the past and had cardiac CT 2006 with calcium score of 0. He had soft plaque in LAD of about 50%.  He had a normal stress echo in 2012. He also had a normal nuc in 2016.  ? Chest discomfort   ? Diabetes mellitus   ? Forgetfulness   ? Hyperlipidemia   ? Hypertension   ? Obesity   ? OSA (obstructive sleep apnea)   ? Paroxysmal atrial fibrillation (HCC)   ? Tremor   ? ? ?Past Surgical History:  ?Procedure Laterality Date  ? ROTATOR CUFF REPAIR  2009  ? Right  ? TRIGGER FINGER RELEASE  02/12/2012  ? Procedure: MINOR RELEASE TRIGGER FINGER/A-1 PULLEY;  Surgeon: Cammie Sickle., MD;  Location: [facility];  Service: Orthopedics;  Laterality: Right;  right thumb  ? ? ?There were no vitals filed for this visit. ? ? Subjective Assessment - 03/14/22 0935   ? ? Subjective I'm doing okay.Still having balance issues when I walk. I tend to lean forward when I walk.   ? Patient Stated Goals have some sense of improvement, sense  of normality in terms of walking and balance, improve back discomfort   ? Currently in Pain? No/denies   ? ?  ?  ? ?  ? ? ? ? ? ? ? ? ? ? ? ? ? ? ? ? ? ? ? ? [facility] Adult PT Treatment/Exercise - 03/14/22 0001   ? ?  ? Lumbar Exercises: Aerobic  ? Nustep lvl 4 x10 mins   ?  ? Lumbar Exercises: Machines for Strengthening  ? Other Lumbar Machine Exercise rows and lats 2x10 20#   ? Other Lumbar Machine Exercise sports cord x4 fwd with step over foam roll; sports cord x4 backwards; sports cord x3 both sides laterally   ?  ? Lumbar Exercises: Standing  ? Other Standing Lumbar Exercises alternating step taps 30 secs x 3 on 6' step   ? ?  ?  ? ?  ? ? ? ? ? ? ? ? ? ? ? ? PT Short Term Goals - 03/14/22 1005   ? ?  ? PT SHORT TERM GOAL #2  ? Title Will be able to tolerate 10-15 minutes of seated aerobic activity without fatigue to show improved functional activity tolerance   ?  Baseline 10   ? Time 4   ? Period Weeks   ? Status Achieved   ?  ? PT SHORT TERM GOAL #3  ? Title Lumbar pain to be no more than 1/10 with functional task performance   ? Time 4   ? Status On-going   ?  ? PT SHORT TERM GOAL #4  ? Title Will demonstrate improved gait mechanics incluidng better upright posture, reduced scissoring and drifting with gait,  improved foot clearance, and ability to maintian straight line ambulation for at least 410f without increased fatigue   ? Time 4   ? Period Weeks   ? Status Partially Met   ? ?  ?  ? ?  ? ? ? ? PT Long Term Goals - 03/14/22 1011   ? ?  ? PT LONG TERM GOAL #1  ? Title MMT to improve by at least 1 grade in all weak groups   ? Baseline 4/27- improving   ? Time 8   ? Period Weeks   ? Status On-going   ? Target Date 03/20/22   ?  ? PT LONG TERM GOAL #2  ? Title Berg score to improve to 52 and DGI score to improve to at least 19   ? Baseline 4/27- berg 51, DGI 18   ? Time 8   ? Period Weeks   ? Status On-going   ?  ? PT LONG TERM GOAL #3  ? Title Will be able to tolerate at least 15-20 minutes of standing  activities and exercise without fatigue or increase in back pain to show improved functional activity tolerance   ? Time 8   ? Period Weeks   ? Status On-going   ?  ? PT LONG TERM GOAL #4  ? Title Will be independent in appropriate gym based aerobic exercise program to maintain functional gains and to help slow progression of parkinsonian symptoms   ? Time 8   ? Period Weeks   ? Status On-going   ? ?  ?  ? ?  ? ? ? ? ? ? ? ? Plan - 03/14/22 1006   ? ? Clinical Impression Statement Patient arrives doing okay. He stated he is still having trouble with his balance so we focused today's session on balance and strengthening back. He had more trouble lifting L LE than the R LE during alternating step taps but did well overall. Constant cueing to stand up straight, tends to lean forward. He had to leave early for dentist appointment.   ? Personal Factors and Comorbidities Time since onset of injury/illness/exacerbation;Past/Current Experience   ? Examination-Activity Limitations Locomotion Level;Transfers;Bed Mobility;Squat;Stairs;Lift;Stand   ? Examination-Participation Restrictions Cleaning;Community Activity;Interpersonal Relationship;Shop;Laundry;Volunteer;YValla LeaverWork   ? Stability/Clinical Decision Making Evolving/Moderate complexity   ? Clinical Decision Making Moderate   ? Rehab Potential Good   ? PT Frequency 2x / week   ? PT Duration 4 weeks   ? PT Treatment/Interventions ADLs/Self Care Home Management;Cryotherapy;Electrical Stimulation;Iontophoresis 488mml Dexamethasone;Moist Heat;Traction;Ultrasound;DME Instruction;Gait training;Stair training;Functional mobility training;Therapeutic activities;Therapeutic exercise;Balance training;Neuromuscular re-education;Patient/family education;Manual techniques;Dry needling;Energy conservation;Taping   ? PT Next Visit Plan progress as tolerated   ? PT Home Exercise Plan [id number] ? Consulted and Agree with Plan of Care Patient   ? ?  ?  ? ?  ? ? ?Patient will benefit from  skilled therapeutic intervention in order to improve the following deficits and impairments:  Abnormal gait, Decreased coordination, Difficulty walking, Decreased endurance, Obesity, Decreased activity tolerance, Pain,  Decreased balance, Decreased mobility, Decreased strength, Postural dysfunction ? ?Visit Diagnosis: ?Difficulty in walking, not elsewhere classified ? ?Unsteadiness on feet ? ?Muscle weakness (generalized) ? ?Other symptoms and signs involving the nervous system ? ? ? ? ?Problem List ?Patient Active Problem List  ? Diagnosis Date Noted  ? Parkinsonism ([place]) 01/18/2022  ? Parkinson's disease ([place]) 02/21/2021  ? Mild cognitive impairment 02/21/2021  ? Low back pain 02/21/2021  ? Bilateral low back pain without sciatica 01/25/2021  ? Tremor 01/05/2021  ? Gait abnormality 11/03/2020  ? Left-sided weakness 11/03/2020  ? Chronic left shoulder pain 11/03/2020  ? REM behavioral disorder 08/24/2020  ? Coagulation defect ([place]) 07/06/2020  ? Paroxysmal atrial fibrillation ([place]) 01/06/2019  ? OSA (obstructive sleep apnea) 09/24/2011  ? Chest pain 05/11/2011  ? Hypertension 05/11/2011  ? Obesity 05/11/2011  ? Hyperlipidemia 05/11/2011  ? ? ? Chauncey Cruel ?03/14/2022, 10:12 AM ? ?[facility] ?[place] ?[address] ?[place], [place], [address] ?Phone: [phone]   Fax:  [phone] ? ?Name: Haiden Rawlinson ?MRN: [id number] ?Date of Birth: 05-27-1956 ? ? ? ?

## 2022-03-16 ENCOUNTER — Encounter: Payer: Self-pay | Admitting: Physical Therapy

## 2022-03-16 ENCOUNTER — Ambulatory Visit: Payer: 59 | Admitting: Physical Therapy

## 2022-03-16 DIAGNOSIS — R2681 Unsteadiness on feet: Secondary | ICD-10-CM

## 2022-03-16 DIAGNOSIS — M6281 Muscle weakness (generalized): Secondary | ICD-10-CM

## 2022-03-16 DIAGNOSIS — R29818 Other symptoms and signs involving the nervous system: Secondary | ICD-10-CM

## 2022-03-16 DIAGNOSIS — R262 Difficulty in walking, not elsewhere classified: Secondary | ICD-10-CM

## 2022-03-16 NOTE — Therapy (Signed)
[facility] ?[place] ?[address] ?[place], [place], [address] ?Phone: [phone]   Fax:  [phone] ? ?Physical Therapy Treatment ? ?Patient Details  ?Name: Edwin Gallagher ?MRN: [id number] ?Date of Birth: [birth year] ?Referring Provider (PT): Marcial Pacas ? ? ?Encounter Date: 03/16/2022 ? ? PT End of Session - 03/16/22 1144   ? ? Visit Number 12   ? Number of Visits 18   ? Date for PT Re-Evaluation 04/05/22   ? Authorization Type Aetna   ? Authorization Time Period 01/23/22 to 03/20/22; extended to 5/25   ? PT Start Time 1102   ? PT Stop Time 1144   ? PT Time Calculation (min) 42 min   ? Activity Tolerance Patient tolerated treatment well   ? Behavior During Therapy [facility] for tasks assessed/performed   ? ?  ?  ? ?  ? ? ?Past Medical History:  ?Diagnosis Date  ? Abnormal cardiac CT angiography 2006  ? only abnormal with soft plaque in LAD calcium score is0   ? Abnormal LFTs   ? Balance problem   ? CAD (coronary artery disease)   ? a. has seen Dr. Lia Foyer in the past and had cardiac CT 2006 with calcium score of 0. He had soft plaque in LAD of about 50%.  He had a normal stress echo in 2012. He also had a normal nuc in 2016.  ? Chest discomfort   ? Diabetes mellitus   ? Forgetfulness   ? Hyperlipidemia   ? Hypertension   ? Obesity   ? OSA (obstructive sleep apnea)   ? Paroxysmal atrial fibrillation (HCC)   ? Tremor   ? ? ?Past Surgical History:  ?Procedure Laterality Date  ? ROTATOR CUFF REPAIR  2009  ? Right  ? TRIGGER FINGER RELEASE  02/12/2012  ? Procedure: MINOR RELEASE TRIGGER FINGER/A-1 PULLEY;  Surgeon: Cammie Sickle., MD;  Location: [facility];  Service: Orthopedics;  Laterality: Right;  right thumb  ? ? ?There were no vitals filed for this visit. ? ? Subjective Assessment - 03/16/22 1106   ? ? Subjective I'm doing okay. I have trouble going up stairs.   ? Currently in Pain? Yes   ? Pain Score 2    ? Pain Location Knee   ? Pain Orientation Left   ? Pain  Descriptors / Indicators Aching   ? ?  ?  ? ?  ? ? ? ? ? ? ? ? ? ? ? ? ? ? ? ? ? ? ? ? [facility] Adult PT Treatment/Exercise - 03/16/22 0001   ? ?  ? Lumbar Exercises: Aerobic  ? Recumbent Bike lvl 2 x 6 mins   ?  ? Lumbar Exercises: Machines for Strengthening  ? Other Lumbar Machine Exercise rows 1x10 20#, 1x10 25#; lat pull downs 2x10 25#   ? Other Lumbar Machine Exercise sports cord x4 all ways   shoulder ext 2x10 10#  ?  ? Lumbar Exercises: Standing  ? Other Standing Lumbar Exercises fwd step ups 2x10 B LE; lateral step ups 1x10 B LE   ?  ? Lumbar Exercises: Seated  ? Sit to Stand 10 reps   ? Other Seated Lumbar Exercises STS w/ overhead flexion w/ yellow weight ball   ? ?  ?  ? ?  ? ? ? ? ? ? ? ? ? ? ? ? PT Short Term Goals - 03/16/22 1141   ? ?  ? PT SHORT  TERM GOAL #3  ? Title Lumbar pain to be no more than 1/10 with functional task performance   ? Baseline 3/10   ? Time 4   ? Period Weeks   ? Status On-going   ?  ? PT SHORT TERM GOAL #4  ? Title Will demonstrate improved gait mechanics incluidng better upright posture, reduced scissoring and drifting with gait,  improved foot clearance, and ability to maintian straight line ambulation for at least 421ft without increased fatigue   ? Baseline 4/27- still bent over, scissoring and drifting improved, doesn't tolerate 48ft yet   ? Time 4   ? Period Weeks   ? Status Partially Met   ? ?  ?  ? ?  ? ? ? ? PT Long Term Goals - 03/16/22 1142   ? ?  ? PT LONG TERM GOAL #1  ? Title MMT to improve by at least 1 grade in all weak groups   ? Baseline 4/27- improving   ? Time 8   ? Period Weeks   ? Status On-going   ? Target Date 03/20/22   ?  ? PT LONG TERM GOAL #2  ? Title Berg score to improve to 52 and DGI score to improve to at least 19   ? Baseline 4/27- berg 51, DGI 18   ? Time 8   ? Period Weeks   ? Status On-going   ?  ? PT LONG TERM GOAL #3  ? Title Will be able to tolerate at least 15-20 minutes of standing activities and exercise without fatigue or increase in back  pain to show improved functional activity tolerance   ? Time 8   ? Period Weeks   ? Status On-going   ?  ? PT LONG TERM GOAL #4  ? Title Will be independent in appropriate gym based aerobic exercise program to maintain functional gains and to help slow progression of parkinsonian symptoms   ? Time 8   ? Period Weeks   ? Status On-going   ? ?  ?  ? ?  ? ? ? ? ? ? ? ? Plan - 03/16/22 1145   ? ? Clinical Impression Statement Patient arrives doing well. He stated he had trouble going up stairs so we worked on that today. He was compliant and did well overall. Cueing to stand up straight but not as much as last session.   ? Personal Factors and Comorbidities Time since onset of injury/illness/exacerbation;Past/Current Experience   ? Examination-Activity Limitations Locomotion Level;Transfers;Bed Mobility;Squat;Stairs;Lift;Stand   ? Examination-Participation Restrictions Cleaning;Community Activity;Interpersonal Relationship;Shop;Laundry;Volunteer;Valla Leaver Work   ? Stability/Clinical Decision Making Evolving/Moderate complexity   ? Clinical Decision Making Moderate   ? Rehab Potential Good   ? PT Frequency 2x / week   ? PT Duration 4 weeks   ? PT Treatment/Interventions ADLs/Self Care Home Management;Cryotherapy;Electrical Stimulation;Iontophoresis 4mg /ml Dexamethasone;Moist Heat;Traction;Ultrasound;DME Instruction;Gait training;Stair training;Functional mobility training;Therapeutic activities;Therapeutic exercise;Balance training;Neuromuscular re-education;Patient/family education;Manual techniques;Dry needling;Energy conservation;Taping   ? PT Next Visit Plan progress as tolerated   ? PT Home Exercise Plan [id number]   ? Consulted and Agree with Plan of Care Patient   ? ?  ?  ? ?  ? ? ?Patient will benefit from skilled therapeutic intervention in order to improve the following deficits and impairments:  Abnormal gait, Decreased coordination, Difficulty walking, Decreased endurance, Obesity, Decreased activity tolerance,  Pain, Decreased balance, Decreased mobility, Decreased strength, Postural dysfunction ? ?Visit Diagnosis: ?Difficulty in walking, not elsewhere classified ? ?Unsteadiness on feet ? ?  Muscle weakness (generalized) ? ?Other symptoms and signs involving the nervous system ? ? ? ? ?Problem List ?Patient Active Problem List  ? Diagnosis Date Noted  ? Parkinsonism ([place]) 01/18/2022  ? Parkinson's disease ([place]) 02/21/2021  ? Mild cognitive impairment 02/21/2021  ? Low back pain 02/21/2021  ? Bilateral low back pain without sciatica 01/25/2021  ? Tremor 01/05/2021  ? Gait abnormality 11/03/2020  ? Left-sided weakness 11/03/2020  ? Chronic left shoulder pain 11/03/2020  ? REM behavioral disorder 08/24/2020  ? Coagulation defect ([place]) 07/06/2020  ? Paroxysmal atrial fibrillation ([place]) 01/06/2019  ? OSA (obstructive sleep apnea) 09/24/2011  ? Chest pain 05/11/2011  ? Hypertension 05/11/2011  ? Obesity 05/11/2011  ? Hyperlipidemia 05/11/2011  ? ? ? Chauncey Cruel ?03/16/2022, 11:46 AM ? ?[facility] ?[address] ?[place], [place], [address] ?Phone: [phone]   Fax:  [phone] ? ?Name: Truett Mcfarlan ?MRN: [id number] ?Date of Birth: 09-23-1956 ? ? ? ?

## 2022-03-19 ENCOUNTER — Ambulatory Visit: Payer: 59 | Admitting: Physical Therapy

## 2022-03-22 ENCOUNTER — Ambulatory Visit: Payer: 59 | Admitting: Physical Therapy

## 2022-03-22 ENCOUNTER — Encounter: Payer: Self-pay | Admitting: Physical Therapy

## 2022-03-22 DIAGNOSIS — M6281 Muscle weakness (generalized): Secondary | ICD-10-CM

## 2022-03-22 DIAGNOSIS — R262 Difficulty in walking, not elsewhere classified: Secondary | ICD-10-CM | POA: Diagnosis not present

## 2022-03-22 DIAGNOSIS — R2681 Unsteadiness on feet: Secondary | ICD-10-CM

## 2022-03-22 DIAGNOSIS — R29818 Other symptoms and signs involving the nervous system: Secondary | ICD-10-CM

## 2022-03-22 NOTE — Therapy (Signed)
[facility] ?[place] ?[address] ?[place], [place], [address] ?Phone: [phone]   Fax:  [phone] ? ?Physical Therapy Treatment ? ?Patient Details  ?Name: Edwin Gallagher ?MRN: [id number] ?Date of Birth: 1956-04-04 ?Referring Provider (PT): Marcial Pacas ? ? ?Encounter Date: 03/22/2022 ? ? PT End of Session - 03/22/22 1020   ? ? Visit Number 13   ? Number of Visits 18   ? Date for PT Re-Evaluation 04/05/22   ? Authorization Type Aetna   ? Authorization Time Period 01/23/22 to 03/20/22; extended to 5/25   ? PT Start Time [phone]   ? PT Stop Time 1013   ? PT Time Calculation (min) 39 min   ? Activity Tolerance Patient tolerated treatment well   ? Behavior During Therapy [facility] for tasks assessed/performed   ? ?  ?  ? ?  ? ? ?Past Medical History:  ?Diagnosis Date  ? Abnormal cardiac CT angiography 2006  ? only abnormal with soft plaque in LAD calcium score is0   ? Abnormal LFTs   ? Balance problem   ? CAD (coronary artery disease)   ? a. has seen Dr. Lia Foyer in the past and had cardiac CT 2006 with calcium score of 0. He had soft plaque in LAD of about 50%.  He had a normal stress echo in 2012. He also had a normal nuc in 2016.  ? Chest discomfort   ? Diabetes mellitus   ? Forgetfulness   ? Hyperlipidemia   ? Hypertension   ? Obesity   ? OSA (obstructive sleep apnea)   ? Paroxysmal atrial fibrillation (HCC)   ? Tremor   ? ? ?Past Surgical History:  ?Procedure Laterality Date  ? ROTATOR CUFF REPAIR  2009  ? Right  ? TRIGGER FINGER RELEASE  02/12/2012  ? Procedure: MINOR RELEASE TRIGGER FINGER/A-1 PULLEY;  Surgeon: Cammie Sickle., MD;  Location: [facility];  Service: Orthopedics;  Laterality: Right;  right thumb  ? ? ?There were no vitals filed for this visit. ? ? Subjective Assessment - 03/22/22 0936   ? ? Subjective My leg is really bothering me, I'm wondering if I have a nerve impingement, they gave gabapentin for nerve symptoms. It started past friday night and when  I woke up saturday morning I had a lot of pain. Sunday night it happened again. Not sure what makes it worse but maybe on my right side is worse. Only medication makes it better.   ? Patient Stated Goals have some sense of improvement, sense of normality in terms of walking and balance, improve back discomfort   ? Currently in Pain? Yes   ? Pain Score 3    ? Pain Location Leg   ? Pain Orientation Right   ? Pain Descriptors / Indicators Aching   ? Pain Type Acute pain   ? ?  ?  ? ?  ? ? ? ? ? ? ? ? ? ? ? ? ? ? ? ? ? ? ? ? [place] [facility] Adult PT Treatment/Exercise - 03/22/22 0001   ? ?  ? Lumbar Exercises: Stretches  ? Passive Hamstring Stretch Right;Left;3 reps;30 seconds   ? Single Knee to Chest Stretch Right;Left;3 reps;30 seconds   ? Piriformis Stretch Right;Left;3 reps;30 seconds   ? Other Lumbar Stretch Exercise QL stretch 3x30 seconds   ?  ? Lumbar Exercises: Aerobic  ? Nustep L4x6 minutes BLEs only   ? ?  ?  ? ?  ? ? ? ? ? ? ? ? ? ?  PT Education - 03/22/22 1020   ? ? Education Details positioning and stretches to address mm spasms and try to relieve pressure on area of impinged nerve   ? Person(s) Educated Patient   ? Methods Explanation   ? Comprehension Verbalized understanding   ? ?  ?  ? ?  ? ? ? PT Short Term Goals - 03/16/22 1141   ? ?  ? PT SHORT TERM GOAL #3  ? Title Lumbar pain to be no more than 1/10 with functional task performance   ? Baseline 3/10   ? Time 4   ? Period Weeks   ? Status On-going   ?  ? PT SHORT TERM GOAL #4  ? Title Will demonstrate improved gait mechanics incluidng better upright posture, reduced scissoring and drifting with gait,  improved foot clearance, and ability to maintian straight line ambulation for at least 46f without increased fatigue   ? Baseline 4/27- still bent over, scissoring and drifting improved, doesn't tolerate 4071fyet   ? Time 4   ? Period Weeks   ? Status Partially Met   ? ?  ?  ? ?  ? ? ? ? PT Long Term Goals - 03/16/22 1142   ? ?  ? PT LONG TERM GOAL #1  ?  Title MMT to improve by at least 1 grade in all weak groups   ? Baseline 4/27- improving   ? Time 8   ? Period Weeks   ? Status On-going   ? Target Date 03/20/22   ?  ? PT LONG TERM GOAL #2  ? Title Berg score to improve to 52 and DGI score to improve to at least 19   ? Baseline 4/27- berg 51, DGI 18   ? Time 8   ? Period Weeks   ? Status On-going   ?  ? PT LONG TERM GOAL #3  ? Title Will be able to tolerate at least 15-20 minutes of standing activities and exercise without fatigue or increase in back pain to show improved functional activity tolerance   ? Time 8   ? Period Weeks   ? Status On-going   ?  ? PT LONG TERM GOAL #4  ? Title Will be independent in appropriate gym based aerobic exercise program to maintain functional gains and to help slow progression of parkinsonian symptoms   ? Time 8   ? Period Weeks   ? Status On-going   ? ?  ?  ? ?  ? ? ? ? ? ? ? ? Plan - 03/22/22 1021   ? ? Clinical Impression Statement Mr. LeLuccasrrives today doing OK; he?s been having some nerve driven pain in his R LE since last Friday, ?feels like a pinched nerve?, was given gabapentin by his MD. We were a little more cautious today-he did request a little lighter session due to pain-, focused a little more on trying to problem solve and improve his back pain since it has been bothering him quite a bit recently. Hopefully he  will feel better next time and we can return to more difficult activities. Education provided on appropriate positioning and activities to reduce pressure on impinged nerve.   ? Personal Factors and Comorbidities Time since onset of injury/illness/exacerbation;Past/Current Experience   ? Examination-Activity Limitations Locomotion Level;Transfers;Bed Mobility;Squat;Stairs;Lift;Stand   ? Examination-Participation Restrictions Cleaning;Community Activity;Interpersonal Relationship;Shop;Laundry;Volunteer;[place]   ? Stability/Clinical Decision Making Evolving/Moderate complexity   ? Clinical Decision Making  Moderate   ? Rehab  Potential Good   ? PT Frequency 2x / week   ? PT Duration 4 weeks   ? PT Treatment/Interventions ADLs/Self Care Home Management;Cryotherapy;Electrical Stimulation;Iontophoresis 62m/ml Dexamethasone;Moist Heat;Traction;Ultrasound;DME Instruction;Gait training;Stair training;Functional mobility training;Therapeutic activities;Therapeutic exercise;Balance training;Neuromuscular re-education;Patient/family education;Manual techniques;Dry needling;Energy conservation;Taping   ? PT Next Visit Plan progress as tolerated,how is nerve pain   ? PT Home Exercise Plan [id number]  ? Consulted and Agree with Plan of Care Patient   ? ?  ?  ? ?  ? ? ?Patient will benefit from skilled therapeutic intervention in order to improve the following deficits and impairments:  Abnormal gait, Decreased coordination, Difficulty walking, Decreased endurance, Obesity, Decreased activity tolerance, Pain, Decreased balance, Decreased mobility, Decreased strength, Postural dysfunction ? ?Visit Diagnosis: ?Difficulty in walking, not elsewhere classified ? ?Muscle weakness (generalized) ? ?Unsteadiness on feet ? ?Other symptoms and signs involving the nervous system ? ? ? ? ?Problem List ?Patient Active Problem List  ? Diagnosis Date Noted  ? Parkinsonism ([place] 01/18/2022  ? [place]'s disease ([place] 02/21/2021  ? Mild cognitive impairment 02/21/2021  ? Low back pain 02/21/2021  ? Bilateral low back pain without sciatica 01/25/2021  ? Tremor 01/05/2021  ? Gait abnormality 11/03/2020  ? Left-sided weakness 11/03/2020  ? Chronic left shoulder pain 11/03/2020  ? REM behavioral disorder 08/24/2020  ? Coagulation defect ([facility] 07/06/2020  ? Paroxysmal atrial fibrillation ([place] 01/06/2019  ? OSA (obstructive sleep apnea) 09/24/2011  ? Chest pain 05/11/2011  ? Hypertension 05/11/2011  ? Obesity 05/11/2011  ? Hyperlipidemia 05/11/2011  ? ? U PT, DPT, PN2  ? ?Supplemental Physical Therapist ?[place] ? ? ? ? ? ?[facility] ?[address] ?[place] [address]?Phone: [phone]  Fax:  [phone]? ?Name: Edwin Gallagher?MRN: [id number]?Date of Birth: [birth year]? ? ? ?

## 2022-03-26 ENCOUNTER — Ambulatory Visit: Payer: 59 | Admitting: Physical Therapy

## 2022-03-26 ENCOUNTER — Encounter: Payer: Self-pay | Admitting: Physical Therapy

## 2022-03-26 DIAGNOSIS — M6281 Muscle weakness (generalized): Secondary | ICD-10-CM

## 2022-03-26 DIAGNOSIS — R262 Difficulty in walking, not elsewhere classified: Secondary | ICD-10-CM

## 2022-03-26 DIAGNOSIS — R2681 Unsteadiness on feet: Secondary | ICD-10-CM

## 2022-03-26 NOTE — Therapy (Signed)
[facility] [address] ?[place], [place], [address] ?Phone: [phone]   Fax:  [phone] ? ?Physical Therapy Treatment ? ?Patient Details  ?Name: Edwin Gallagher ?MRN: [id number] ?Date of Birth: [birth year] ?Referring Provider (PT): Marcial Pacas ? ? ?Encounter Date: 03/26/2022 ? ? PT End of Session - 03/26/22 1011   ? ? Visit Number 14   ? Date for PT Re-Evaluation 04/05/22   ? PT Start Time 0930   ? PT Stop Time 1014   ? PT Time Calculation (min) 44 min   ? Activity Tolerance Patient tolerated treatment well   ? Behavior During Therapy [facility] for tasks assessed/performed   ? ?  ?  ? ?  ? ? ?Past Medical History:  ?Diagnosis Date  ? Abnormal cardiac CT angiography 2006  ? only abnormal with soft plaque in LAD calcium score is0   ? Abnormal LFTs   ? Balance problem   ? CAD (coronary artery disease)   ? a. has seen Dr. Lia Foyer in the past and had cardiac CT 2006 with calcium score of 0. He had soft plaque in LAD of about 50%.  He had a normal stress echo in 2012. He also had a normal nuc in 2016.  ? Chest discomfort   ? Diabetes mellitus   ? Forgetfulness   ? Hyperlipidemia   ? Hypertension   ? Obesity   ? OSA (obstructive sleep apnea)   ? Paroxysmal atrial fibrillation (HCC)   ? Tremor   ? ? ?Past Surgical History:  ?Procedure Laterality Date  ? ROTATOR CUFF REPAIR  2009  ? Right  ? TRIGGER FINGER RELEASE  02/12/2012  ? Procedure: MINOR RELEASE TRIGGER FINGER/A-1 PULLEY;  Surgeon: Cammie Sickle., MD;  Location: [facility];  Service: Orthopedics;  Laterality: Right;  right thumb  ? ? ?There were no vitals filed for this visit. ? ? Subjective Assessment - 03/26/22 0932   ? ? Subjective Pretty good overall. Near fall yesterday missed a step going up just LOB able to regain balance.   ? Currently in Pain? Yes   ? Pain Score 1    ? Pain Location Hip   ? Pain Orientation Right   ? ?  ?  ? ?  ? ? ? ? ? ? ? ? ? ? ? ? ? ? ? ? ? ? ? ? [facility]  Treatment/Exercise - 03/26/22 0001   ? ?  ? Lumbar Exercises: Stretches  ? Passive Hamstring Stretch Right;Left;3 reps;30 seconds   ? Single Knee to Chest Stretch Right;Left;3 reps;30 seconds   ? Piriformis Stretch Right;Left;3 reps;30 seconds   ?  ? Lumbar Exercises: Aerobic  ? Recumbent Bike lvl 3 x 6 mins   ?  ? Lumbar Exercises: Standing  ? Shoulder Extension Strengthening;Power Tower;Both;20 reps   ? Shoulder Extension Limitations 10   ?  ? Lumbar Exercises: Seated  ? Other Seated Lumbar Exercises Rows & Lats 25lb 2x10   ?  ? Knee/Hip Exercises: Standing  ? Forward Step Up 1 set;Both;10 reps;Hand Hold: 0;Step Height: 6"   ? Walking with Sports Cord 30lb side step x5 each   ? ?  ?  ? ?  ? ? ? ? ? ? ? ? ? ? ? ? PT Short Term Goals - 03/16/22 1141   ? ?  ? PT SHORT TERM GOAL #3  ? Title Lumbar pain to be no more than 1/10 with functional  task performance   ? Baseline 3/10   ? Time 4   ? Period Weeks   ? Status On-going   ?  ? PT SHORT TERM GOAL #4  ? Title Will demonstrate improved gait mechanics incluidng better upright posture, reduced scissoring and drifting with gait,  improved foot clearance, and ability to maintian straight line ambulation for at least 482ft without increased fatigue   ? Baseline 4/27- still bent over, scissoring and drifting improved, doesn't tolerate 420ft yet   ? Time 4   ? Period Weeks   ? Status Partially Met   ? ?  ?  ? ?  ? ? ? ? PT Long Term Goals - 03/16/22 1142   ? ?  ? PT LONG TERM GOAL #1  ? Title MMT to improve by at least 1 grade in all weak groups   ? Baseline 4/27- improving   ? Time 8   ? Period Weeks   ? Status On-going   ? Target Date 03/20/22   ?  ? PT LONG TERM GOAL #2  ? Title Berg score to improve to 52 and DGI score to improve to at least 19   ? Baseline 4/27- berg 51, DGI 18   ? Time 8   ? Period Weeks   ? Status On-going   ?  ? PT LONG TERM GOAL #3  ? Title Will be able to tolerate at least 15-20 minutes of standing activities and exercise without fatigue or  increase in back pain to show improved functional activity tolerance   ? Time 8   ? Period Weeks   ? Status On-going   ?  ? PT LONG TERM GOAL #4  ? Title Will be independent in appropriate gym based aerobic exercise program to maintain functional gains and to help slow progression of parkinsonian symptoms   ? Time 8   ? Period Weeks   ? Status On-going   ? ?  ?  ? ?  ? ? ? ? ? ? ? ? Plan - 03/26/22 1012   ? ? Clinical Impression Statement Pt enters feeling ok. He did report some pain in the R leg that he stated may be coming form his back. Session consisted of LE strength, balance, and stretching.Tactile cue to prevent forward trunk flexion needed with shoulder extensions. Fatigue reported with at pull downs. Good stability with step ups. Bilateral LE tightness noted in both LE with stretching.   ? Personal Factors and Comorbidities Time since onset of injury/illness/exacerbation;Past/Current Experience   ? Examination-Activity Limitations Locomotion Level;Transfers;Bed Mobility;Squat;Stairs;Lift;Stand   ? Examination-Participation Restrictions Cleaning;Community Activity;Interpersonal Relationship;Shop;Laundry;Volunteer;[employer] Work   ? Rehab Potential Good   ? PT Frequency 2x / week   ? PT Duration 4 weeks   ? PT Treatment/Interventions ADLs/Self Care Home Management;Cryotherapy;Electrical Stimulation;Iontophoresis 4mg /ml Dexamethasone;Moist Heat;Traction;Ultrasound;DME Instruction;Gait training;Stair training;Functional mobility training;Therapeutic activities;Therapeutic exercise;Balance training;Neuromuscular re-education;Patient/family education;Manual techniques;Dry needling;Energy conservation;Taping   ? PT Next Visit Plan progress as tolerated,how is nerve pain   ? ?  ?  ? ?  ? ? ?Patient will benefit from skilled therapeutic intervention in order to improve the following deficits and impairments:  Abnormal gait, Decreased coordination, Difficulty walking, Decreased endurance, Obesity, Decreased activity  tolerance, Pain, Decreased balance, Decreased mobility, Decreased strength, Postural dysfunction ? ?Visit Diagnosis: ?Difficulty in walking, not elsewhere classified ? ?Unsteadiness on feet ? ?Muscle weakness (generalized) ? ? ? ? ?Problem List ?Patient Active Problem List  ? Diagnosis Date Noted  ? Parkinsonism ([place]) 01/18/2022  ?  Parkinson's disease ([place]) 02/21/2021  ? Mild cognitive impairment 02/21/2021  ? Low back pain 02/21/2021  ? Bilateral low back pain without sciatica 01/25/2021  ? Tremor 01/05/2021  ? Gait abnormality 11/03/2020  ? Left-sided weakness 11/03/2020  ? Chronic left shoulder pain 11/03/2020  ? REM behavioral disorder 08/24/2020  ? Coagulation defect ([place]) 07/06/2020  ? Paroxysmal atrial fibrillation ([place]) 01/06/2019  ? OSA (obstructive sleep apnea) 09/24/2011  ? Chest pain 05/11/2011  ? Hypertension 05/11/2011  ? Obesity 05/11/2011  ? Hyperlipidemia 05/11/2011  ? ? ?Scot Jun, PTA ?03/26/2022, 10:15 AM ? ?[facility] [address] ?[place], [place], [address] ?Phone: [phone]   Fax:  [phone] ? ?Name: Edwin Gallagher ?MRN: [id number] ?Date of Birth: 1956/03/24 ? ? ? ?

## 2022-03-27 ENCOUNTER — Ambulatory Visit: Payer: 59 | Admitting: Internal Medicine

## 2022-03-28 NOTE — Progress Notes (Signed)
HPI male never smoker followed for OSA, REM Behavior Disorder, complicated by Parkinson's,  Obesity, DM 2, HBP, PAFib, Hyperlipidemia, NPSG 2012 AHI 85/ hr, CPAP titrated to 14 HST-09/24/17-AHI 20.9/hour, desaturation to 84%, body weight 289 pounds  -------------------------------------- 12/27/20- 66 year old male never smoker followed for OSA, REM Behavior Disorder, complicated by obesity, DM 2, HTN, PAFib/ Eliquis, Hyperlipidemia,  CPAP titration study 07/14/20- 14 cwp, + RBD with screaming, raising arms during REM CPAP auto 5-14/ [facility] Download- compliance 100%, AHI 1.2/ hr Body weight today-269 lbs Covid vax-3 Phizer Flu vax-had Melatonin for sleep. Less sleep disruption with CPAP and fewer episodes of RBD- sitting/ screaming.  I commented on resting tremor L hand. He has f/u appointment with Neurology/ Dr Terrace Arabia. MRI noted  Small vessel disease. He is seeing PT for weakness L leg.   03/29/22-66 year old male never smoker followed for OSA, REM Behavior Disorder, complicated by Parkinson's, Obesity, DM 2, HTN, CAD, PAFib/ Eliquis, Hyperlipidemia,  CPAP titration study 07/14/20- 14 cwp, Parkinson's, + RBD with screaming, raising arms during REM CPAP auto 5-14/ [facility]      AirSense 10 AutoSet Download- compliance 83%, AHI 5.1/ hr   pressure range 8.5-11.8   max leak 24.8 Body weight today-269 lbs Covid vax-3 Phizer Flu vax-had [facility] for Parkinson's. -----Patient feels like he is doing good, no concerns Download reviewed.  He is sleeping well with current mask.  We think machine is past 65 years old and can start replacement.  Pressure setting is appropriate. Plan he is working with a neurologist at [facility] for his Parkinson's.  Breathing is comfortable.  ROS-see HPI   + = positive Constitutional:    weight loss, night sweats, fevers, chills, fatigue, lassitude. HEENT:    headaches, difficulty swallowing, tooth/dental problems, sore throat,       sneezing, itching, ear ache, nasal congestion, post  nasal drip, snoring CV:    chest pain, orthopnea, PND, swelling in lower extremities, anasarca,                                                          dizziness, palpitations Resp:   shortness of breath with exertion or at rest.                productive cough,   non-productive cough, coughing up of blood.              change in color of mucus.  wheezing.    Skin:    rash or lesions. GI:  No-   heartburn, indigestion, abdominal pain, nausea, vomiting, diarrhea,                 change in bowel habits, loss of appetite GU: dysuria, change in color of urine, + nocturia.   flank pain. MS:   joint pain, stiffness, decreased range of motion,+ back pain. Neuro-     + HPI Psych:  change in mood or affect.  depression or anxiety.   memory loss.  OBJ- Physical Exam General- Alert, Oriented, Affect-appropriate, Distress- none acute, + Obese Skin- rash-none, lesions- none, excoriation- none Lymphadenopathy- none Head- atraumatic            Eyes- Gross vision intact, PERRLA, conjunctivae and secretions clear            Ears- Hearing, canals-normal  Nose- Clear, no-Septal dev, mucus, polyps, erosion, perforation             Throat- Mallampati III , mucosa clear , drainage- none, tonsils- atrophic Neck- flexible , trachea midline, no stridor , thyroid nl, carotid no bruit Chest - symmetrical excursion , unlabored           Heart/CV- RRR at this visit , no murmur , no gallop  , no rub, nl s1 s2                           - JVD- none , edema- none, stasis changes- none, varices- none           Lung- clear to P&A, wheeze- none, cough- none , dullness-none, rub- none           Chest wall-  Abd-  Br/ Gen/ Rectal- Not done, not indicated Extrem- cyanosis- none, clubbing, none, atrophy- none, strength- nl Neuro-  + resting tremor L hand, speech a little slow

## 2022-03-30 ENCOUNTER — Encounter: Payer: Self-pay | Admitting: Internal Medicine

## 2022-03-30 ENCOUNTER — Ambulatory Visit (INDEPENDENT_AMBULATORY_CARE_PROVIDER_SITE_OTHER): Payer: 59 | Admitting: Internal Medicine

## 2022-03-30 ENCOUNTER — Ambulatory Visit: Payer: 59 | Admitting: Physical Therapy

## 2022-03-30 VITALS — BP 124/60 | HR 83 | Temp 98.1°F | Ht 72.0 in | Wt 269.0 lb

## 2022-03-30 DIAGNOSIS — G20 Parkinson's disease: Secondary | ICD-10-CM

## 2022-03-30 DIAGNOSIS — G4733 Obstructive sleep apnea (adult) (pediatric): Secondary | ICD-10-CM | POA: Diagnosis not present

## 2022-03-30 NOTE — Patient Instructions (Signed)
Order- DME [facility]- please replace old CPAP machine, auto 5-14, mask of choice, humidifier, supplies, AirView/ card  Please call if we can help

## 2022-03-30 NOTE — Assessment & Plan Note (Signed)
Benefits from CPAP with good compliance and control Plan-replace old machine, continuing auto 5-14

## 2022-03-30 NOTE — Assessment & Plan Note (Signed)
Followed at [facility].  Recognize resting tremor and some flattening of facial expression at visit today.

## 2022-04-04 ENCOUNTER — Encounter: Payer: Self-pay | Admitting: Physical Therapy

## 2022-04-04 ENCOUNTER — Ambulatory Visit: Payer: 59 | Admitting: Physical Therapy

## 2022-04-04 DIAGNOSIS — R29818 Other symptoms and signs involving the nervous system: Secondary | ICD-10-CM

## 2022-04-04 DIAGNOSIS — R262 Difficulty in walking, not elsewhere classified: Secondary | ICD-10-CM

## 2022-04-04 DIAGNOSIS — R2681 Unsteadiness on feet: Secondary | ICD-10-CM

## 2022-04-04 NOTE — Therapy (Signed)
[facility]. [place], [place], [address] Phone: [phone]   Fax:  [phone]  Physical Therapy Treatment  Patient Details  Name: Edwin Gallagher MRN: [id number] Date of Birth: December 25, 1955 Referring Provider (PT): Marcial Pacas   Encounter Date: 04/04/2022   PT End of Session - 04/04/22 1017     Visit Number 15    Date for PT Re-Evaluation 04/05/22    PT Start Time 0930    PT Stop Time 1015    PT Time Calculation (min) 45 min    Activity Tolerance Patient tolerated treatment well    Behavior During Therapy [facility] for tasks assessed/performed             Past Medical History:  Diagnosis Date   Abnormal cardiac CT angiography 2006   only abnormal with soft plaque in LAD calcium score is0    Abnormal LFTs    Balance problem    CAD (coronary artery disease)    a. has seen Dr. Lia Foyer in the past and had cardiac CT 2006 with calcium score of 0. He had soft plaque in LAD of about 50%.  He had a normal stress echo in 2012. He also had a normal nuc in 2016.   Chest discomfort    Diabetes mellitus    Forgetfulness    Hyperlipidemia    Hypertension    Obesity    OSA (obstructive sleep apnea)    Paroxysmal atrial fibrillation (HCC)    Tremor     Past Surgical History:  Procedure Laterality Date   ROTATOR CUFF REPAIR  2009   Right   TRIGGER FINGER RELEASE  02/12/2012   Procedure: MINOR RELEASE TRIGGER FINGER/A-1 PULLEY;  Surgeon: Cammie Sickle., MD;  Location: [facility];  Service: Orthopedics;  Laterality: Right;  right thumb    There were no vitals filed for this visit.   Subjective Assessment - 04/04/22 0934     Subjective "Ok I guess"    Currently in Pain? No/denies                               [facility] Adult PT Treatment/Exercise - 04/04/22 0001       Lumbar Exercises: Aerobic   Nustep L5x6 minutes BLEs only      Lumbar Exercises: Standing   Shoulder Extension  Strengthening;Power Tower;Both;20 reps    Shoulder Extension Limitations 10      Knee/Hip Exercises: Machines for Strengthening   Cybex Knee Flexion 35lb 2x10, LLE 15lb x10      Knee/Hip Exercises: Standing   Lateral Step Up Both;1 set;10 reps;Hand Hold: 2    Forward Step Up Both;1 set;10 reps;Hand Hold: 0;Step Height: 6"      Knee/Hip Exercises: Seated   Sit to Sand 2 sets;10 reps   holding yellow ball                      PT Short Term Goals - 03/16/22 1141       PT SHORT TERM GOAL #3   Title Lumbar pain to be no more than 1/10 with functional task performance    Baseline 3/10    Time 4    Period Weeks    Status On-going      PT SHORT TERM GOAL #4   Title Will demonstrate improved gait mechanics incluidng better upright posture, reduced scissoring and drifting with gait,  improved foot clearance, and ability to maintian straight line ambulation for at least 468f without increased fatigue    Baseline 4/27- still bent over, scissoring and drifting improved, doesn't tolerate 4038fyet    Time 4    Period Weeks    Status Partially Met               PT Long Term Goals - 03/16/22 1142       PT LONG TERM GOAL #1   Title MMT to improve by at least 1 grade in all weak groups    Baseline 4/27- improving    Time 8    Period Weeks    Status On-going    Target Date 03/20/22      PT LONG TERM GOAL #2   Title Berg score to improve to 52 and DGI score to improve to at least 19    Baseline 4/27- berg 51, DGI 18    Time 8    Period Weeks    Status On-going      PT LONG TERM GOAL #3   Title Will be able to tolerate at least 15-20 minutes of standing activities and exercise without fatigue or increase in back pain to show improved functional activity tolerance    Time 8    Period Weeks    Status On-going      PT LONG TERM GOAL #4   Title Will be independent in appropriate gym based aerobic exercise program to maintain functional gains and to help slow  progression of parkinsonian symptoms    Time 8    Period Weeks    Status On-going                   Plan - 04/04/22 1018     Clinical Impression Statement Pt enters clinic feeling well overall. Continues progression of balance and total body strength. Pt doe become very fatigue with all functional activities. Rest needed during each set of step ups due to fatigue. CGA with step ups also needed as pt fatigues. Increase resistance tolerated with seated leg curls. Postural weakness noted with shoulder extensions.    Personal Factors and Comorbidities Time since onset of injury/illness/exacerbation;Past/Current Experience    Examination-Activity Limitations Locomotion Level;Transfers;Bed Mobility;Squat;Stairs;Lift;Stand    Examination-Participation Restrictions Cleaning;Community Activity;Interpersonal Relationship;Shop;Laundry;Volunteer;Yard Work    [employer] officervolving/Moderate complexity    Rehab Potential Good    PT Frequency 2x / week    PT Duration 4 weeks    PT Treatment/Interventions ADLs/Self Care Home Management;Cryotherapy;Electrical Stimulation;Iontophoresis 40m53ml Dexamethasone;Moist Heat;Traction;Ultrasound;DME Instruction;Gait training;Stair training;Functional mobility training;Therapeutic activities;Therapeutic exercise;Balance training;Neuromuscular re-education;Patient/family education;Manual techniques;Dry needling;Energy conservation;Taping    PT Next Visit Plan RECERT             Patient will benefit from skilled therapeutic intervention in order to improve the following deficits and impairments:  Abnormal gait, Decreased coordination, Difficulty walking, Decreased endurance, Obesity, Decreased activity tolerance, Pain, Decreased balance, Decreased mobility, Decreased strength, Postural dysfunction  Visit Diagnosis: Difficulty in walking, not elsewhere classified  Unsteadiness on feet  Other symptoms and signs involving the nervous  system     Problem List Patient Active Problem List   Diagnosis Date Noted   Parkinsonism ([place]3/07/2022   Parkinson's disease ([facility]CC[place]4/10/2021   Mild cognitive impairment 02/21/2021   Low back pain 02/21/2021   Bilateral low back pain without sciatica 01/25/2021   Tremor 01/05/2021   Gait abnormality 11/03/2020   Left-sided weakness 11/03/2020   Chronic left shoulder pain 11/03/2020  REM behavioral disorder 08/24/2020   Coagulation defect ([place]) 07/06/2020   Paroxysmal atrial fibrillation ([place]) 01/06/2019   OSA (obstructive sleep apnea) 09/24/2011   Chest pain 05/11/2011   Hypertension 05/11/2011   Obesity 05/11/2011   Hyperlipidemia 05/11/2011    Scot Jun, PTA 04/04/2022, 10:20 AM  [facility] [place], [place], [address] Phone: [phone]   Fax:  [phone]  Name: Sedrick Tober MRN: [id number] Date of Birth: 1956-01-09

## 2022-04-06 ENCOUNTER — Encounter: Payer: Self-pay | Admitting: Physical Therapy

## 2022-04-06 ENCOUNTER — Ambulatory Visit: Payer: 59 | Admitting: Physical Therapy

## 2022-04-06 DIAGNOSIS — R262 Difficulty in walking, not elsewhere classified: Secondary | ICD-10-CM | POA: Diagnosis not present

## 2022-04-06 DIAGNOSIS — M6281 Muscle weakness (generalized): Secondary | ICD-10-CM

## 2022-04-06 DIAGNOSIS — R29818 Other symptoms and signs involving the nervous system: Secondary | ICD-10-CM

## 2022-04-06 DIAGNOSIS — R2681 Unsteadiness on feet: Secondary | ICD-10-CM

## 2022-04-06 NOTE — Therapy (Signed)
[place]. [place], [place], [address] Phone: [phone]   Fax:  [phone]  Physical Therapy Treatment  Patient Details  Name: Edwin Gallagher MRN: [id number] Date of Birth: 02/12/1956 Referring Provider (PT): Marcial Pacas   Encounter Date: 04/06/2022   PT End of Session - 04/06/22 1011     Visit Number 16    Date for PT Re-Evaluation 04/05/22    PT Start Time 0930    PT Stop Time 1015    PT Time Calculation (min) 45 min    Activity Tolerance Patient tolerated treatment well    Behavior During Therapy [facility] for tasks assessed/performed             Past Medical History:  Diagnosis Date   Abnormal cardiac CT angiography 2006   only abnormal with soft plaque in LAD calcium score is0    Abnormal LFTs    Balance problem    CAD (coronary artery disease)    a. has seen Dr. Lia Foyer in the past and had cardiac CT 2006 with calcium score of 0. He had soft plaque in LAD of about 50%.  He had a normal stress echo in 2012. He also had a normal nuc in 2016.   Chest discomfort    Diabetes mellitus    Forgetfulness    Hyperlipidemia    Hypertension    Obesity    OSA (obstructive sleep apnea)    Paroxysmal atrial fibrillation (HCC)    Tremor     Past Surgical History:  Procedure Laterality Date   ROTATOR CUFF REPAIR  2009   Right   TRIGGER FINGER RELEASE  02/12/2012   Procedure: MINOR RELEASE TRIGGER FINGER/A-1 PULLEY;  Surgeon: Cammie Sickle., MD;  Location: [facility];  Service: Orthopedics;  Laterality: Right;  right thumb    There were no vitals filed for this visit.   Subjective Assessment - 04/06/22 0932     Subjective "I feel pretty good"    Currently in Pain? No/denies                [facility] PT Assessment - 04/06/22 0001       Strength   Right Hip Flexion 4/5    Right Hip ABduction 4+/5    Left Hip Flexion 4/5    Left Hip ABduction 4+/5    Right Knee Flexion 4/5    Right  Knee Extension 5/5    Left Knee Flexion 4/5    Left Knee Extension 5/5    Right Ankle Dorsiflexion 5/5    Left Ankle Dorsiflexion 5/5      Berg Balance Test   Sit to Stand Able to stand without using hands and stabilize independently    Standing Unsupported Able to stand safely 2 minutes    Sitting with Back Unsupported but Feet Supported on Floor or Stool Able to sit safely and securely 2 minutes    Stand to Sit Sits safely with minimal use of hands    Transfers Able to transfer safely, minor use of hands    Standing Unsupported with Eyes Closed Able to stand 10 seconds safely    Standing Unsupported with Feet Together Able to place feet together independently and stand 1 minute safely    From Standing, Reach Forward with Outstretched Arm Can reach confidently >25 cm (10")    From Standing Position, Pick up Object from Floor Able to pick up shoe safely and easily  From Standing Position, Turn to Look Behind Over each Shoulder Looks behind from both sides and weight shifts well    Turn 360 Degrees Able to turn 360 degrees safely in 4 seconds or less    Standing Unsupported, Alternately Place Feet on Step/Stool Able to stand independently and safely and complete 8 steps in 20 seconds    Standing Unsupported, One Foot in Front Able to take small step independently and hold 30 seconds    Standing on One Leg Able to lift leg independently and hold equal to or more than 3 seconds    Total Score 52      Dynamic Gait Index   Level Surface Normal    Change in Gait Speed Normal    Gait with Horizontal Head Turns Normal    Gait with Vertical Head Turns Normal    Gait and Pivot Turn Normal    Step Over Obstacle Mild Impairment    Step Around Obstacles Moderate Impairment    Steps Mild Impairment    Total Score 20                           [facility] Adult PT Treatment/Exercise - 04/06/22 0001       Lumbar Exercises: Aerobic   UBE (Upper Arm Bike) L2 x 2 min each    Nustep  L5 x6 min      Lumbar Exercises: Standing   Shoulder Extension Strengthening;Power Tower;Both;20 reps    Shoulder Extension Limitations 10      Knee/Hip Exercises: Machines for Strengthening   Cybex Knee Extension 10lb 2x10, LLE 5lb 2x10    Cybex Knee Flexion 35lb 2x10, LLE 15lb x10                       PT Short Term Goals - 03/16/22 1141       PT SHORT TERM GOAL #3   Title Lumbar pain to be no more than 1/10 with functional task performance    Baseline 3/10    Time 4    Period Weeks    Status On-going      PT SHORT TERM GOAL #4   Title Will demonstrate improved gait mechanics incluidng better upright posture, reduced scissoring and drifting with gait,  improved foot clearance, and ability to maintian straight line ambulation for at least 412f without increased fatigue    Baseline 4/27- still bent over, scissoring and drifting improved, doesn't tolerate [id number]    Time 4    Period Weeks    Status Partially Met               PT Long Term Goals - 04/06/22 0950       PT LONG TERM GOAL #3   Title Will be able to tolerate at least 15-20 minutes of standing activities and exercise without fatigue or increase in back pain to show improved functional activity tolerance    Status Partially Met                   Plan - 04/06/22 1013     Clinical Impression Statement Pt enter feeling well. He has progressed meeting most LTG's. Some difficulty with SLS [place] has also progressed increasing his LE strength. Pt Reports no functional limitations at home and is pleased with his current functional status.    Personal Factors and Comorbidities Time since onset of injury/illness/exacerbation;Past/Current Experience    Examination-Activity  Limitations Locomotion Level;Transfers;Bed Mobility;Squat;Stairs;Lift;Stand    Examination-Participation Restrictions Cleaning;Community Activity;Interpersonal Relationship;Shop;Laundry;Volunteer;Yard Work     [employer] Evolving/Moderate complexity    Rehab Potential Good    PT Frequency 2x / week    PT Duration 4 weeks    PT Treatment/Interventions ADLs/Self Care Home Management;Cryotherapy;Electrical Stimulation;Iontophoresis 5m/ml Dexamethasone;Moist Heat;Traction;Ultrasound;DME Instruction;Gait training;Stair training;Functional mobility training;Therapeutic activities;Therapeutic exercise;Balance training;Neuromuscular re-education;Patient/family education;Manual techniques;Dry needling;Energy conservation;Taping    PT Next Visit Plan D/C             Patient will benefit from skilled therapeutic intervention in order to improve the following deficits and impairments:  Abnormal gait, Decreased coordination, Difficulty walking, Decreased endurance, Obesity, Decreased activity tolerance, Pain, Decreased balance, Decreased mobility, Decreased strength, Postural dysfunction  Visit Diagnosis: Other symptoms and signs involving the nervous system  Difficulty in walking, not elsewhere classified  Muscle weakness (generalized)  Unsteadiness on feet     Problem List Patient Active Problem List   Diagnosis Date Noted   Parkinsonism ([facility] 01/18/2022   Parkinson's disease ([facility] 02/21/2021   Mild cognitive impairment 02/21/2021   Low back pain 02/21/2021   Bilateral low back pain without sciatica 01/25/2021   Tremor 01/05/2021   Gait abnormality 11/03/2020   Left-sided weakness 11/03/2020   Chronic left shoulder pain 11/03/2020   REM behavioral disorder 08/24/2020   Coagulation defect ([facility] 07/06/2020   Paroxysmal atrial fibrillation ([facility] 01/06/2019   OSA (obstructive sleep apnea) 09/24/2011   Chest pain 05/11/2011   Hypertension 05/11/2011   Obesity 05/11/2011   Hyperlipidemia 05/11/2011   PHYSICAL THERAPY DISCHARGE SUMMARY  Visits from Start of Care: 16  Patient agrees to discharge. Patient goals were partially met. Patient is being discharged due to  being pleased with the current functional level.   RScot Jun PTA 04/06/2022, 10:15 AM  [place] [address]Phone: [phone]  Fax:  [phone] Name: Edwin HengstMRN: [id number] of Birth: 3September 08, 1957

## 2022-04-11 ENCOUNTER — Ambulatory Visit: Payer: 59 | Admitting: Physical Therapy

## 2022-04-13 ENCOUNTER — Ambulatory Visit: Payer: 59 | Admitting: Physical Therapy

## 2022-05-25 ENCOUNTER — Encounter: Payer: Self-pay | Admitting: Podiatry

## 2022-05-25 ENCOUNTER — Ambulatory Visit (INDEPENDENT_AMBULATORY_CARE_PROVIDER_SITE_OTHER): Payer: 59 | Admitting: Podiatry

## 2022-05-25 DIAGNOSIS — D689 Coagulation defect, unspecified: Secondary | ICD-10-CM | POA: Diagnosis not present

## 2022-05-25 DIAGNOSIS — G20 Parkinson's disease: Secondary | ICD-10-CM | POA: Diagnosis not present

## 2022-05-25 DIAGNOSIS — B351 Tinea unguium: Secondary | ICD-10-CM

## 2022-05-25 DIAGNOSIS — E1149 Type 2 diabetes mellitus with other diabetic neurological complication: Secondary | ICD-10-CM

## 2022-05-25 DIAGNOSIS — M79675 Pain in left toe(s): Secondary | ICD-10-CM

## 2022-05-25 DIAGNOSIS — E1140 Type 2 diabetes mellitus with diabetic neuropathy, unspecified: Secondary | ICD-10-CM

## 2022-05-25 DIAGNOSIS — M79674 Pain in right toe(s): Secondary | ICD-10-CM

## 2022-05-25 NOTE — Progress Notes (Signed)
This patient returns to my office for at risk foot care.  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  This patient is unable to cut nails himself since the patient cannot reach his nails.These nails are painful walking and wearing shoes.  This patient presents for at risk foot care today.  General Appearance  Alert, conversant and in no acute stress.  Vascular  Dorsalis pedis and posterior tibial  pulses are palpable  bilaterally.  Capillary return is within normal limits  bilaterally. Temperature is within normal limits  bilaterally.  Neurologic  Senn-Weinstein monofilament wire test within normal limits  bilaterally. Muscle power within normal limits bilaterally.  Nails Thick disfigured discolored nails with subungual debris  from hallux to fifth toes bilaterally. No evidence of bacterial infection or drainage bilaterally.  Orthopedic  No limitations of motion  feet .  No crepitus or effusions noted.  No bony pathology or digital deformities noted. Mild HAV  B/L.  ADV 5th digit  B/L.  Skin  normotropic skin with no porokeratosis noted bilaterally.  No signs of infections or ulcers noted.     Onychomycosis  Pain in right toes  Pain in left toes  Consent was obtained for treatment procedures.   Mechanical debridement of nails 1-5  bilaterally performed with a nail nipper.  Filed with dremel without incident.    Return office visit     3 months                 Told patient to return for periodic foot care and evaluation due to potential at risk complications.     DPM  

## 2022-07-23 ENCOUNTER — Ambulatory Visit: Payer: 59 | Admitting: Neurology

## 2022-07-23 ENCOUNTER — Encounter: Payer: Self-pay | Admitting: Neurology

## 2022-08-31 ENCOUNTER — Encounter: Payer: Self-pay | Admitting: Podiatry

## 2022-08-31 ENCOUNTER — Ambulatory Visit (INDEPENDENT_AMBULATORY_CARE_PROVIDER_SITE_OTHER): Payer: 59 | Admitting: Podiatry

## 2022-08-31 DIAGNOSIS — E1140 Type 2 diabetes mellitus with diabetic neuropathy, unspecified: Secondary | ICD-10-CM

## 2022-08-31 DIAGNOSIS — B351 Tinea unguium: Secondary | ICD-10-CM

## 2022-08-31 DIAGNOSIS — M79674 Pain in right toe(s): Secondary | ICD-10-CM

## 2022-08-31 DIAGNOSIS — E1149 Type 2 diabetes mellitus with other diabetic neurological complication: Secondary | ICD-10-CM

## 2022-08-31 DIAGNOSIS — M79675 Pain in left toe(s): Secondary | ICD-10-CM | POA: Diagnosis not present

## 2022-08-31 DIAGNOSIS — D689 Coagulation defect, unspecified: Secondary | ICD-10-CM

## 2022-08-31 NOTE — Progress Notes (Signed)
This patient returns to my office for at risk foot care.  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  This patient is unable to cut nails himself since the patient cannot reach his nails.These nails are painful walking and wearing shoes.  This patient presents for at risk foot care today.  General Appearance  Alert, conversant and in no acute stress.  Vascular  Dorsalis pedis and posterior tibial  pulses are palpable  bilaterally.  Capillary return is within normal limits  bilaterally. Temperature is within normal limits  bilaterally.  Neurologic  Senn-Weinstein monofilament wire test within normal limits  bilaterally. Muscle power within normal limits bilaterally.  Nails Thick disfigured discolored nails with subungual debris  from hallux to fifth toes bilaterally. No evidence of bacterial infection or drainage bilaterally.  Orthopedic  No limitations of motion  feet .  No crepitus or effusions noted.  No bony pathology or digital deformities noted. Mild HAV  B/L.  ADV 5th digit  B/L.  Skin  normotropic skin with no porokeratosis noted bilaterally.  No signs of infections or ulcers noted.     Onychomycosis  Pain in right toes  Pain in left toes  Consent was obtained for treatment procedures.   Mechanical debridement of nails 1-5  bilaterally performed with a nail nipper.  Filed with dremel without incident.    Return office visit     3 months                 Told patient to return for periodic foot care and evaluation due to potential at risk complications.     DPM  

## 2022-12-03 ENCOUNTER — Ambulatory Visit: Payer: 59 | Admitting: Podiatry

## 2022-12-29 ENCOUNTER — Encounter (HOSPITAL_BASED_OUTPATIENT_CLINIC_OR_DEPARTMENT_OTHER): Payer: Self-pay | Admitting: Emergency Medicine

## 2022-12-29 ENCOUNTER — Other Ambulatory Visit: Payer: Self-pay

## 2022-12-29 ENCOUNTER — Emergency Department (HOSPITAL_BASED_OUTPATIENT_CLINIC_OR_DEPARTMENT_OTHER)
Admission: EM | Admit: 2022-12-29 | Discharge: 2022-12-29 | Disposition: A | Discharge status: Home / Self Care | Payer: 59 | Attending: Emergency Medicine | Admitting: Emergency Medicine

## 2022-12-29 ENCOUNTER — Emergency Department (HOSPITAL_BASED_OUTPATIENT_CLINIC_OR_DEPARTMENT_OTHER): Payer: 59

## 2022-12-29 DIAGNOSIS — I2510 Atherosclerotic heart disease of native coronary artery without angina pectoris: Secondary | ICD-10-CM | POA: Diagnosis not present

## 2022-12-29 DIAGNOSIS — R051 Acute cough: Secondary | ICD-10-CM

## 2022-12-29 DIAGNOSIS — I10 Essential (primary) hypertension: Secondary | ICD-10-CM | POA: Insufficient documentation

## 2022-12-29 DIAGNOSIS — J069 Acute upper respiratory infection, unspecified: Secondary | ICD-10-CM | POA: Insufficient documentation

## 2022-12-29 DIAGNOSIS — Z79899 Other long term (current) drug therapy: Secondary | ICD-10-CM | POA: Diagnosis not present

## 2022-12-29 DIAGNOSIS — R079 Chest pain, unspecified: Secondary | ICD-10-CM | POA: Diagnosis present

## 2022-12-29 DIAGNOSIS — Z7901 Long term (current) use of anticoagulants: Secondary | ICD-10-CM | POA: Diagnosis not present

## 2022-12-29 DIAGNOSIS — E119 Type 2 diabetes mellitus without complications: Secondary | ICD-10-CM | POA: Diagnosis not present

## 2022-12-29 DIAGNOSIS — Z7984 Long term (current) use of oral hypoglycemic drugs: Secondary | ICD-10-CM | POA: Insufficient documentation

## 2022-12-29 DIAGNOSIS — I480 Paroxysmal atrial fibrillation: Secondary | ICD-10-CM | POA: Diagnosis not present

## 2022-12-29 LAB — CBC
HCT: 41.2 % (ref 39.0–52.0)
Hemoglobin: 13.6 g/dL (ref 13.0–17.0)
MCH: 30.2 pg (ref 26.0–34.0)
MCHC: 33 g/dL (ref 30.0–36.0)
MCV: 91.6 fL (ref 80.0–100.0)
Platelets: 191 10*3/uL (ref 150–400)
RBC: 4.5 MIL/uL (ref 4.22–5.81)
RDW: 13.7 % (ref 11.5–15.5)
WBC: 6.3 10*3/uL (ref 4.0–10.5)
nRBC: 0 % (ref 0.0–0.2)

## 2022-12-29 LAB — BASIC METABOLIC PANEL
Anion gap: 7 (ref 5–15)
BUN: 13 mg/dL (ref 8–23)
CO2: 27 mmol/L (ref 22–32)
Calcium: 8.7 mg/dL — ABNORMAL LOW (ref 8.9–10.3)
Chloride: 103 mmol/L (ref 98–111)
Creatinine, Ser: 1.37 mg/dL — ABNORMAL HIGH (ref 0.61–1.24)
GFR, Estimated: 57 mL/min — ABNORMAL LOW (ref >60)
Glucose, Bld: 173 mg/dL — ABNORMAL HIGH (ref 70–99)
Potassium: 3 mmol/L — ABNORMAL LOW (ref 3.5–5.1)
Sodium: 137 mmol/L (ref 135–145)

## 2022-12-29 LAB — TROPONIN I (HIGH SENSITIVITY): Troponin I (High Sensitivity): 9 ng/L (ref <18)

## 2022-12-29 MED ORDER — DOXYCYCLINE HYCLATE 100 MG PO CAPS: 100.0000 mg | ORAL_CAPSULE | Freq: Two times a day (BID) | ORAL | 0 refills | Status: AC

## 2022-12-29 MED ORDER — POTASSIUM CHLORIDE CRYS ER 20 MEQ PO TBCR: 20.0000 meq | EXTENDED_RELEASE_TABLET | Freq: Every day | ORAL | 0 refills | Status: DC

## 2022-12-29 MED ORDER — BENZONATATE 100 MG PO CAPS: 100.0000 mg | ORAL_CAPSULE | Freq: Three times a day (TID) | ORAL | 0 refills | Status: DC | PRN

## 2022-12-29 MED ORDER — POTASSIUM CHLORIDE CRYS ER 20 MEQ PO TBCR
40.0000 meq | EXTENDED_RELEASE_TABLET | Freq: Once | ORAL | Status: AC
  Administered 2022-12-29: 40 meq via ORAL
  Filled 2022-12-29: qty 2

## 2022-12-29 NOTE — Discharge Instructions (Signed)
It was a pleasure caring for you!  It is important to take your Eliquis twice a day to avoid formation of blood clots and prevent stroke.  Please continue take the rest of your medications as prescribed.  I given you prescription for an antibiotic just in case you have a pneumonia we cannot see on chest x-ray, as well as a prescription for cough medication.  I have given you potassium prescription to take for the next 2 days.  I recommend follow-up with your primary care doctor for recheck of your labs, and close follow-up in the atrial fibrillation clinic.

## 2022-12-29 NOTE — ED Triage Notes (Signed)
Mid chest pain at 1030 this am , shortness of breath , lightheaded . Hx HTN.  Had cough and congestion last week .  Denies nv.

## 2022-12-29 NOTE — ED Provider Notes (Signed)
[facility] Provider Note   CSN: [id number] Arrival date & time: 12/29/22  1302     History  Chief Complaint  Patient presents with   Chest Pain    Edwin Gallagher is a 67 y.o. male.  HPI     67 year old male with a history of hypertension, hyperlipidemia, diabetes, paroxysmal atrial fibrillation, on Eliquis, OSA, who presents with concern for chest pain and dyspnea.  1030AM began to feel "a warm feeling" in his chest, a little lightheadedness, not so disoriented can't stand, eventually got the diltiazem and laid down, the discomfort hard to describe.  Not a sharp pain.  Just a mild discomfort. Lasted 10 minutes.  No radiation to jaw, arm and back.   Watch said may have atrial fibrillation, symptoms started 30 minutes before that.  Had slight dyspnea at the time.  No nausea, no vomiting.  Now not having symptoms (rhythm changed)  Cough and congestion for 1-2 weeks, maybe a little better but persistent.  Nasal congestion present but improved.  No fever.  Coughing up yellow-white sputum, worse at night. Coricidin started the tablets, but not taking it the last few days as feeling better.  No other new medications. No caffeine, no etoh, no other drugs.   Cbd but not thc for past days. No leg swelling or pain, take gabapentin for sciatic nerve prb and so not having problems recently.  Supposed to be taking the eliquis bid but sometimes will miss dose of it and will take it once day, sometimes wont take it a second time  Metoprolol taking as rx Dlitiazem took it today because having symptoms, takes ita s needed.   06/2018 last afib that brought him to hospital. Has had 1-2 episodes noted by watch that improved at home.   Past Medical History:  Diagnosis Date   Abnormal cardiac CT angiography 2006   only abnormal with soft plaque in LAD calcium score is0    Abnormal LFTs    Balance problem    CAD (coronary artery disease)    a. has seen  Dr. Lia Foyer in the past and had cardiac CT 2006 with calcium score of 0. He had soft plaque in LAD of about 50%.  He had a normal stress echo in 2012. He also had a normal nuc in 2016.   Chest discomfort    Diabetes mellitus    Forgetfulness    Hyperlipidemia    Hypertension    Obesity    OSA (obstructive sleep apnea)    Paroxysmal atrial fibrillation (HCC)    Tremor      Home Medications Prior to Admission medications   Medication Sig Start Date End Date Taking? Authorizing Provider  benzonatate (TESSALON) 100 MG capsule Take 1 capsule (100 mg total) by mouth 3 (three) times daily as needed for cough. 12/29/22  Yes Gareth Morgan, MD  doxycycline (VIBRAMYCIN) 100 MG capsule Take 1 capsule (100 mg total) by mouth 2 (two) times daily for 7 days. 12/29/22 01/05/23 Yes Gareth Morgan, MD  potassium chloride SA (KLOR-CON M) 20 MEQ tablet Take 1 tablet (20 mEq total) by mouth daily for 2 days. 12/29/22 12/31/22 Yes Gareth Morgan, MD  amLODipine (NORVASC) 10 MG tablet Take 10 mg by mouth daily.    [provider]  apixaban (ELIQUIS) 5 MG TABS tablet Take 1 tablet (5 mg total) by mouth 2 (two) times daily. 06/25/18   Sherran Needs, NP  diltiazem (CARDIZEM) 30 MG tablet TAKE  1 TABLET EVERY 4 HOURS AS NEEDED FOR AFIB HEART RATE >100 07/14/19   Sherran Needs, NP  doxazosin (CARDURA) 4 MG tablet Take 4 mg by mouth daily.    [provider]  gabapentin (NEURONTIN) 100 MG capsule Start 113m HS.  Will make gradual increase to 3075mas tolerated.  Written schedule given to patient. 03/21/22   [provider]  glimepiride (AMARYL) 4 MG tablet Take 4 mg by mouth daily.    [provider]  hydrocortisone 2.5 % cream Apply topically. 09/06/20   [provider]  losartan-hydrochlorothiazide (HYZAAR) 100-25 MG tablet Take 1 tablet by mouth daily. 1/2 tablet daily    [provider]  metFORMIN (GLUCOPHAGE) 500 MG tablet Take 500 mg by mouth daily.     [provider]  metoprolol succinate (TOPROL-XL) 100 MG 24 hr tablet Take 1 tablet (100 mg total) by mouth daily. 01/07/20   Dunn, DaNedra HaiPA-C  ONETOUCH ULTRA test strip USE TO TEST BLOOD SUGAR TWICE DAILY 02/22/20   [provider]  rotigotine (NEUPRO) 2 MG/24HR Place 1 patch onto the skin daily. 01/18/22   YaMarcial PacasMD  simvastatin (ZOCOR) 20 MG tablet Take 20 mg by mouth daily.  06/14/17   [provider]  traMADol (ULTRAM) 50 MG tablet Take 50 mg by mouth as needed.    [provider]      Allergies    Patient has no known allergies.    Review of Systems   Review of Systems  Physical Exam Updated Vital Signs BP (!) 122/43   Pulse 78   Temp 98.4 F (36.9 C) (Oral)   Resp 19   Wt 119.3 kg   SpO2 95%   BMI 35.67 kg/m  Physical Exam Constitutional:      General: He is not in acute distress.    Appearance: Normal appearance. He is not ill-appearing or diaphoretic.  HENT:     Head: Normocephalic and atraumatic.  Eyes:     Extraocular Movements: Extraocular movements intact.     Conjunctiva/sclera: Conjunctivae normal.  Neck:     Vascular: No JVD.  Cardiovascular:     Rate and Rhythm: Normal rate and regular rhythm.     Pulses: Normal pulses.     Heart sounds: Normal heart sounds. No murmur heard.    No friction rub. No gallop.  Pulmonary:     Effort: Pulmonary effort is normal. No respiratory distress.     Breath sounds: Normal breath sounds. No wheezing, rhonchi or rales.  Chest:     Chest wall: No tenderness.  Abdominal:     General: Abdomen is flat. There is no distension.     Palpations: Abdomen is soft.  Musculoskeletal:     Right lower leg: No edema.     Left lower leg: No edema.  Skin:    General: Skin is warm and dry.     Findings: No erythema.  Neurological:     Mental Status: He is alert and oriented to person, place, and time.     ED Results / Procedures / Treatments   Labs (all labs ordered are listed, but only  abnormal results are displayed) Labs Reviewed  BASIC METABOLIC PANEL - Abnormal; Notable for the following components:      Result Value   Potassium 3.0 (*)    Glucose, Bld 173 (*)    Creatinine, Ser 1.37 (*)    Calcium 8.7 (*)    GFR, Estimated 57 (*)  All other components within normal limits  CBC  TROPONIN I (HIGH SENSITIVITY)    EKG EKG Interpretation  Date/Time:  Saturday December 29 2022 13:13:28 EST Ventricular Rate:  113 PR Interval:    QRS Duration: 86 QT Interval:  325 QTC Calculation: 446 R Axis:   55 Text Interpretation: Atrial fibrillation Borderline repolarization abnormality Minimal ST elevation, lateral leads Since prior ECG, rhythm no atrial fibrillation, rate increased Confirmed by Gareth Morgan [phone]) on 12/29/2022 1:28:46 PM  Radiology DG Chest 2 View  Result Date: 12/29/2022 CLINICAL DATA:  chest pain EXAM: CHEST - 2 VIEW COMPARISON:  06/18/2018 FINDINGS: Cardiac silhouette is unremarkable. No pneumothorax or pleural effusion. The lungs are clear. The visualized skeletal structures are unremarkable. IMPRESSION: No acute cardiopulmonary process. Electronically Signed   By: Sammie Bench M.D.   On: 12/29/2022 13:50    Procedures Procedures    Medications Ordered in ED Medications  potassium chloride SA (KLOR-CON M) CR tablet 40 mEq (40 mEq Oral Given 12/29/22 1423)    ED Course/ Medical Decision Making/ A&P                              68 year old male with a history of hypertension, hyperlipidemia, diabetes, paroxysmal atrial fibrillation, on Eliquis, OSA, who presents with concern for chest pain and dyspnea.  Differential diagnosis for chest pain includes pulmonary embolus, dissection, pneumothorax, pneumonia, ACS, myocarditis, pericarditis.    EKG was done and evaluate by me and showed no acute ST changes and no signs of pericarditis however does show atrial fibrillation with elevated rate .   Chest x-ray was done and evaluated by me and  radiology and showed no sign of pneumonia, pulmonary edema or pneumothorax.  Low suspicion clinically for PE.  Do not feel history or exam are consistent with aortic dissection.  Arrives in atrial fibrillation with an elevated rate.  His symptoms have now resolved, and at time of my exam he is back in normal sinus rhythm with a normal rate.  Suspect symptoms secondary to symptomatic atrial fibrillation which is now resolved.  Troponin is negative and have low suspicion for ACS given resolution of symptoms with resolution of A-fib and normal troponin in setting of elevated rate.  Suspect atrial fibrillation was triggered by likely viral respiratory infection.  Discussed that CHF is also on the differential, but does not have findings of volume overload at this time, no JVD, no lower extremity edema to suggest need for admission, and in setting of nasal congestion and yellow sputum viral etiology possible.  Also discussed in the setting of 2 weeks of symptoms, occult pneumonia is also on the differential.  He does not have signs of sepsis with no fever, no white blood cell count, but after discussion is interested in empiric antibiotics for treatment of possible pneumonia with persistent symptoms.  Recommend continued monitoring is of his heart rate, diltiazem as needed, and follow-up in the atrial fibrillation clinic.  Recommend adherence to his Eliquis and metoprolol.  For his cough, will give prescription for doxycycline and Tessalon.  Patient discharged in stable condition with understanding of reasons to return.   Labs completed and personally about interpreted by me show a normal white blood cell count, no anemia, mild hypokalemia of 3.  Given K, will give rx for 51mq for 2 days.         Final Clinical Impression(s) / ED Diagnoses Final diagnoses:  Paroxysmal atrial fibrillation ([facility]  Upper respiratory tract infection, unspecified type  Acute cough    Rx / DC Orders ED Discharge  Orders          Ordered    benzonatate (TESSALON) 100 MG capsule  3 times daily PRN        12/29/22 1432    doxycycline (VIBRAMYCIN) 100 MG capsule  2 times daily        12/29/22 1432    potassium chloride SA (KLOR-CON M) 20 MEQ tablet  Daily        12/29/22 1432    Ambulatory referral to Cardiology        12/29/22 1433              Gareth Morgan, MD 12/29/22 1436

## 2023-01-08 ENCOUNTER — Ambulatory Visit (HOSPITAL_COMMUNITY)
Admission: RE | Admit: 2023-01-08 | Discharge: 2023-01-08 | Disposition: A | Discharge status: Home / Self Care | Payer: 59 | Source: Ambulatory Visit | Attending: Nurse Practitioner | Admitting: Nurse Practitioner

## 2023-01-08 ENCOUNTER — Encounter (HOSPITAL_COMMUNITY): Payer: Self-pay | Admitting: Nurse Practitioner

## 2023-01-08 VITALS — BP 124/76 | HR 75 | Ht 72.0 in | Wt 264.0 lb

## 2023-01-08 DIAGNOSIS — E785 Hyperlipidemia, unspecified: Secondary | ICD-10-CM | POA: Insufficient documentation

## 2023-01-08 DIAGNOSIS — I480 Paroxysmal atrial fibrillation: Secondary | ICD-10-CM

## 2023-01-08 DIAGNOSIS — E119 Type 2 diabetes mellitus without complications: Secondary | ICD-10-CM | POA: Diagnosis not present

## 2023-01-08 DIAGNOSIS — Z7901 Long term (current) use of anticoagulants: Secondary | ICD-10-CM | POA: Insufficient documentation

## 2023-01-08 DIAGNOSIS — J069 Acute upper respiratory infection, unspecified: Secondary | ICD-10-CM | POA: Diagnosis not present

## 2023-01-08 DIAGNOSIS — D6869 Other thrombophilia: Secondary | ICD-10-CM

## 2023-01-08 DIAGNOSIS — I10 Essential (primary) hypertension: Secondary | ICD-10-CM | POA: Insufficient documentation

## 2023-01-08 NOTE — Progress Notes (Addendum)
Primary Care Physician: Sueanne Margarita, DO Referring Physician: [facility] ER f/u   Edwin Gallagher is a 67 y.o. male with a h/o  hypertension, hyperlipidemia, diabetes presented to [facility] ER f/u with  chest pressure, shortness of breath.  Patient states that he had intermittent chest pressure and shortness of breath for the last several weeks.  He did have sudden onset of chest pressure that lasted 30 minutes and felt dizzy and lightheaded.  He was noted to be in new onset atrial fibrillation per EMS.  He took aspirin 324 mg and then was pain-free in the ER. He did travel to [place] recently but denied any calf pain or history of blood clots.  Denied any history of coronary artery disease.  He was given 10 mg IV cardizem x one dose and converted pt. He was sent out with an eliquis starter pack but when appointment was made by afib clinic, he was told to not follow the instructions on the pack but just to take 5 mg bid.   He denies any alcohol use, moderate caffeine use, has OSA and uses CPAP,obese, and no regular exercise program. Chadsvasc score of 2 for HTN/DM.  F/u in afib clinic. Echo reviewed with pt, mild LVH, mild MR, mild left atrial enlargement. He has not had any issues with recurrent afib or bleeding issues from  eliquis.  F/u in afib clinic, 01/08/23. I have not seen sine 2019. He was seen in the ED 12/29/22 with c/o's of chest pain, dyspnea, cough. He was in afib with RV, in the setting of a vial illness, possible pneumonia, probably triggering AFib. Converted to  SR when in the ED and symptoms resolved. He had been taking his metoprolol daily.  Very rare episode of afib since 2019. He remains on eliquis. Was treated with antibiotics. K+ low at 3 and repleted. EKG today shows SR. His URI has cleared and he is feeling better. He has not noted any further afib. Now CHA2DS2VASc  is 3 (age, HTN, DM)  Today, he denies symptoms of palpitations, chest pain, shortness of breath, orthopnea, PND, lower  extremity edema, dizziness, presyncope, syncope, or neurologic sequela. The patient is tolerating medications without difficulties and is otherwise without complaint today.   Past Medical History:  Diagnosis Date   Abnormal cardiac CT angiography 2006   only abnormal with soft plaque in LAD calcium score is0    Abnormal LFTs    Balance problem    CAD (coronary artery disease)    a. has seen Dr. Lia Foyer in the past and had cardiac CT 2006 with calcium score of 0. He had soft plaque in LAD of about 50%.  He had a normal stress echo in 2012. He also had a normal nuc in 2016.   Chest discomfort    Diabetes mellitus    Forgetfulness    Hyperlipidemia    Hypertension    Obesity    OSA (obstructive sleep apnea)    Paroxysmal atrial fibrillation (HCC)    Tremor    Past Surgical History:  Procedure Laterality Date   ROTATOR CUFF REPAIR  2009   Right   TRIGGER FINGER RELEASE  02/12/2012   Procedure: MINOR RELEASE TRIGGER FINGER/A-1 PULLEY;  Surgeon: Cammie Sickle., MD;  Location: [facility];  Service: Orthopedics;  Laterality: Right;  right thumb    Current Outpatient Medications  Medication Sig Dispense Refill   amLODipine (NORVASC) 10 MG tablet Take 10 mg by mouth daily.  apixaban (ELIQUIS) 5 MG TABS tablet Take 1 tablet (5 mg total) by mouth 2 (two) times daily. 60 tablet 3   benzonatate (TESSALON) 100 MG capsule Take 1 capsule (100 mg total) by mouth 3 (three) times daily as needed for cough. 21 capsule 0   diltiazem (CARDIZEM) 30 MG tablet TAKE 1 TABLET EVERY 4 HOURS AS NEEDED FOR AFIB HEART RATE >100 45 tablet 1   doxazosin (CARDURA) 4 MG tablet Take 4 mg by mouth daily.     gabapentin (NEURONTIN) 100 MG capsule Start '100mg'$  HS.  Will make gradual increase to '300mg'$  as tolerated.  Written schedule given to patient.     glimepiride (AMARYL) 4 MG tablet Take 4 mg by mouth daily.     hydrocortisone 2.5 % cream Apply topically.     losartan-hydrochlorothiazide  (HYZAAR) 100-25 MG tablet Take 1 tablet by mouth daily. 1/2 tablet daily     metFORMIN (GLUCOPHAGE) 500 MG tablet Take 500 mg by mouth daily.     metoprolol succinate (TOPROL-XL) 100 MG 24 hr tablet Take 1 tablet (100 mg total) by mouth daily. 30 tablet 0   ONETOUCH ULTRA test strip USE TO TEST BLOOD SUGAR TWICE DAILY     potassium chloride SA (KLOR-CON M) 20 MEQ tablet Take 1 tablet (20 mEq total) by mouth daily for 2 days. 2 tablet 0   rotigotine (NEUPRO) 2 MG/24HR Place 1 patch onto the skin daily. 30 patch 12   simvastatin (ZOCOR) 20 MG tablet Take 20 mg by mouth daily.      traMADol (ULTRAM) 50 MG tablet Take 50 mg by mouth as needed.     No current facility-administered medications for this encounter.    No Known Allergies  Social History   Socioeconomic History   Marital status: Married    Spouse name: Not on file   Number of children: 9   Years of education: two years of college   Highest education level: Not on file  Occupational History   Occupation: Retired     Fish farm manager: [employer]  Tobacco Use   Smoking status: Never   Smokeless tobacco: Never  Vaping Use   Vaping Use: Never used  Substance and Sexual Activity   Alcohol use: No   Drug use: No   Sexual activity: Not on file  Other Topics Concern   Not on file  Social History Narrative   Lives at home with his wife.   Ambidextrous.   No daily use of caffeine, occasional soda.   Social Determinants of Health   Financial Resource Strain: Not on file  Food Insecurity: Not on file  Transportation Needs: Not on file  Physical Activity: Not on file  Stress: Not on file  Social Connections: Not on file  Intimate Partner Violence: Not on file    Family History  Problem Relation Age of Onset   Hypertension Mother    Hypertension Father    Stroke Father    Sudden death Sister    Hypertension Sister    Alcohol abuse Brother    Heart attack Neg Hx     ROS- All systems are reviewed and negative except as per the  HPI above  Physical Exam: Vitals:   01/08/23 1014  Height: 6' (1.829 m)   Wt Readings from Last 3 Encounters:  12/29/22 119.3 kg  03/30/22 122 kg  01/18/22 122.7 kg    Labs: Lab Results  Component Value Date   NA 137 12/29/2022   K 3.0 (L) 12/29/2022  CL 103 12/29/2022   CO2 27 12/29/2022   GLUCOSE 173 (H) 12/29/2022   BUN 13 12/29/2022   CREATININE 1.37 (H) 12/29/2022   CALCIUM 8.7 (L) 12/29/2022   PHOS 3.3 06/16/2008   MG 1.6 06/16/2008   No results found for: "INR" Lab Results  Component Value Date   CHOL 113 10/28/2018   HDL 46 10/28/2018   LDLCALC 50 10/28/2018   TRIG 83 10/28/2018     GEN- The patient is well appearing, alert and oriented x 3 today.   Head- normocephalic, atraumatic Eyes-  Sclera clear, conjunctiva pink Ears- hearing intact Oropharynx- clear Neck- supple, no JVP Lymph- no cervical lymphadenopathy Lungs- Clear to ausculation bilaterally, normal work of breathing Heart- Regular rate and rhythm, no murmurs, rubs or gallops, PMI not laterally displaced GI- soft, NT, ND, + BS Extremities- no clubbing, cyanosis, or edema MS- no significant deformity or atrophy Skin- no rash or lesion Psych- euthymic mood, full affect Neuro- strength and sensation are intact   EKG- Vent. rate 75 BPM PR interval 126 ms QRS duration 86 ms QT/QTcB 388/433 ms P-R-T axes 59 43 47 Normal sinus rhythm Nonspecific T wave abnormality Abnormal ECG When compared with ECG of 29-Dec-2022 14:22, PREVIOUS ECG IS PRESENT  Echo-- 2019 -Left ventricle: The cavity size was normal. There was mild   concentric hypertrophy. Systolic function was normal. The   estimated ejection fraction was in the range of 60% to 65%. Wall   motion was normal; there were no regional wall motion   abnormalities. Doppler parameters are consistent with abnormal   left ventricular relaxation (grade 1 diastolic dysfunction).   There was no evidence of elevated ventricular filling pressure  by   Doppler parameters. - Aortic valve: There was no regurgitation. - Mitral valve: There was mild regurgitation. Valve area by   pressure half-time: 2 cm^2. - Left atrium: The atrium was mildly dilated. - Right ventricle: The cavity size was normal. Wall thickness was   normal. Systolic function was normal. - Right atrium: The atrium was normal in size. - Tricuspid valve: There was no regurgitation. - Pulmonary arteries: Systolic pressure was within the normal   range. - Inferior vena cava: The vessel was normal in size. - Pericardium, extracardiac: There was no pericardial effusion.     Assessment and Plan: 1. New onset paroxysmal afib 2019 Has been very quiet over the years, seen briefly in the ED associated with URI Self converted to SR in the ED  and is in SR today, he has not been aware of any further afib  Continue metoprolol succinate 100 mg daily  Regular exercise and weight   loss encouraged  Continue to use cpap  2. Chadsvasc  score of 3 Continue Eliquis 5 mg bid   Reminded not to miss the pm dose   3. URI Pt states resolved    F/u afib clinic as needed   Butch Penny C. , [facility] [address] [place], [place] [id number] [phone]

## 2023-01-09 ENCOUNTER — Encounter: Payer: Self-pay | Admitting: Podiatry

## 2023-01-09 ENCOUNTER — Ambulatory Visit (INDEPENDENT_AMBULATORY_CARE_PROVIDER_SITE_OTHER): Payer: 59 | Admitting: Podiatry

## 2023-01-09 ENCOUNTER — Ambulatory Visit: Payer: 59 | Admitting: Podiatry

## 2023-01-09 DIAGNOSIS — B351 Tinea unguium: Secondary | ICD-10-CM

## 2023-01-09 DIAGNOSIS — E1140 Type 2 diabetes mellitus with diabetic neuropathy, unspecified: Secondary | ICD-10-CM

## 2023-01-09 DIAGNOSIS — E1149 Type 2 diabetes mellitus with other diabetic neurological complication: Secondary | ICD-10-CM

## 2023-01-09 DIAGNOSIS — M79675 Pain in left toe(s): Secondary | ICD-10-CM

## 2023-01-09 DIAGNOSIS — M79674 Pain in right toe(s): Secondary | ICD-10-CM

## 2023-01-09 NOTE — Progress Notes (Signed)
This patient returns to my office for at risk foot care.  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  This patient is unable to cut nails himself since the patient cannot reach his nails.These nails are painful walking and wearing shoes.  This patient presents for at risk foot care today.  General Appearance  Alert, conversant and in no acute stress.  Vascular  Dorsalis pedis and posterior tibial  pulses are palpable  bilaterally.  Capillary return is within normal limits  bilaterally. Temperature is within normal limits  bilaterally.  Neurologic  Senn-Weinstein monofilament wire test within normal limits  bilaterally. Muscle power within normal limits bilaterally.  Nails Thick disfigured discolored nails with subungual debris  from hallux to fifth toes bilaterally. No evidence of bacterial infection or drainage bilaterally.  Orthopedic  No limitations of motion  feet .  No crepitus or effusions noted.  No bony pathology or digital deformities noted. Mild HAV  B/L.  ADV 5th digit  B/L.  Skin  normotropic skin with no porokeratosis noted bilaterally.  No signs of infections or ulcers noted.     Onychomycosis  Pain in right toes  Pain in left toes  Consent was obtained for treatment procedures.   Mechanical debridement of nails 1-5  bilaterally performed with a nail nipper.  Filed with dremel without incident.    Return office visit     3 months                 Told patient to return for periodic foot care and evaluation due to potential at risk complications.   Gardiner Barefoot DPM

## 2023-01-11 ENCOUNTER — Ambulatory Visit: Payer: 59 | Admitting: Podiatry

## 2023-03-31 NOTE — Progress Notes (Unsigned)
HPI male never smoker followed for OSA, REM Behavior Disorder, complicated by Parkinson's,  Obesity, DM 2, HBP, PAFib, Hyperlipidemia, NPSG 2012 AHI 85/ hr, CPAP titrated to 14 HST-09/24/17-AHI 20.9/hour, desaturation to 84%, body weight 289 pounds  --------------------------------------   03/29/22-67 year old male never smoker followed for OSA, REM Behavior Disorder, complicated by Parkinson's, Obesity, DM 2, HTN, CAD, PAFib/ Eliquis, Hyperlipidemia,  CPAP titration study 07/14/20- 14 cwp, Parkinson's, + RBD with screaming, raising arms during REM CPAP auto 5-14/ [facility]      AirSense 10 AutoSet Download- compliance 83%, AHI 5.1/ hr   pressure range 8.5-11.8   max leak 24.8 Body weight today-269 lbs Covid vax-3 Phizer Flu vax-had [facility] for Parkinson's. -----Patient feels like he is doing good, no concerns Download reviewed.  He is sleeping well with current mask.  We think machine is past 66 years old and can start replacement.  Pressure setting is appropriate. Plan he is working with a neurologist at [facility] for his Parkinson's.  Breathing is comfortable.  04/02/23- 67 year old male never smoker followed for OSA, REM Behavior Disorder, complicated by Parkinson's, Obesity, DM 2, HTN, CAD, PAFib/ Eliquis, Hyperlipidemia,  CPAP titration study 07/14/20- 14 cwp, Parkinson's, + RBD with screaming, raising arms during REM CPAP auto 5-14/ [facility]      AirSense 10 AutoSet Download- compliance  Body weight today-   CXR 12/29/22- IMPRESSION: No acute cardiopulmonary process.  ROS-see HPI   + = positive Constitutional:    weight loss, night sweats, fevers, chills, fatigue, lassitude. HEENT:    headaches, difficulty swallowing, tooth/dental problems, sore throat,       sneezing, itching, ear ache, nasal congestion, post nasal drip, snoring CV:    chest pain, orthopnea, PND, swelling in lower extremities, anasarca,                                                          dizziness, palpitations Resp:    shortness of breath with exertion or at rest.                productive cough,   non-productive cough, coughing up of blood.              change in color of mucus.  wheezing.    Skin:    rash or lesions. GI:  No-   heartburn, indigestion, abdominal pain, nausea, vomiting, diarrhea,                 change in bowel habits, loss of appetite GU: dysuria, change in color of urine, + nocturia.   flank pain. MS:   joint pain, stiffness, decreased range of motion,+ back pain. Neuro-     + HPI Psych:  change in mood or affect.  depression or anxiety.   memory loss.  OBJ- Physical Exam General- Alert, Oriented, Affect-appropriate, Distress- none acute, + Obese Skin- rash-none, lesions- none, excoriation- none Lymphadenopathy- none Head- atraumatic            Eyes- Gross vision intact, PERRLA, conjunctivae and secretions clear            Ears- Hearing, canals-normal            Nose- Clear, no-Septal dev, mucus, polyps, erosion, perforation             Throat- Mallampati III , mucosa clear ,  drainage- none, tonsils- atrophic Neck- flexible , trachea midline, no stridor , thyroid nl, carotid no bruit Chest - symmetrical excursion , unlabored           Heart/CV- RRR at this visit , no murmur , no gallop  , no rub, nl s1 s2                           - JVD- none , edema- none, stasis changes- none, varices- none           Lung- clear to P&A, wheeze- none, cough- none , dullness-none, rub- none           Chest wall-  Abd-  Br/ Gen/ Rectal- Not done, not indicated Extrem- cyanosis- none, clubbing, none, atrophy- none, strength- nl Neuro-  + resting tremor L hand, speech a little slow

## 2023-04-02 ENCOUNTER — Ambulatory Visit (INDEPENDENT_AMBULATORY_CARE_PROVIDER_SITE_OTHER): Payer: 59 | Admitting: Internal Medicine

## 2023-04-02 ENCOUNTER — Encounter: Payer: Self-pay | Admitting: Internal Medicine

## 2023-04-02 VITALS — BP 128/88 | HR 82 | Ht 72.0 in | Wt 263.7 lb

## 2023-04-02 DIAGNOSIS — G4733 Obstructive sleep apnea (adult) (pediatric): Secondary | ICD-10-CM | POA: Diagnosis not present

## 2023-04-02 DIAGNOSIS — G4752 REM sleep behavior disorder: Secondary | ICD-10-CM | POA: Diagnosis not present

## 2023-04-02 NOTE — Patient Instructions (Signed)
Order- DME Christoper Allegra- please replace old CPAP machine, auto 5-15. Please refit mask for comfort. Needs extra long hose for movement and sleep position.  Humidifier, supplies, Airview/ card.  Try increasing melatonin at night to 15 mg (3 of the 5 mg tabs) to see if this helps the movement in sleep.

## 2023-04-03 ENCOUNTER — Encounter: Payer: Self-pay | Admitting: Internal Medicine

## 2023-04-03 NOTE — Assessment & Plan Note (Signed)
This is an aspect of his Parkinson's disease managed by Neurology.  Since he has not seen much benefit at 10 mg melatonin I did suggest he try increasing to 15 mg.  He will discuss results with Neurology.

## 2023-04-03 NOTE — Assessment & Plan Note (Signed)
He has benefited from CPAP when used. Plan-DME to replace old CPAP machine, continuing auto 5-15 and providing longer hose if possible to allow more movement in bed.  Change of mask style may help.

## 2023-04-09 ENCOUNTER — Encounter: Payer: Self-pay | Admitting: Podiatry

## 2023-04-09 ENCOUNTER — Ambulatory Visit (INDEPENDENT_AMBULATORY_CARE_PROVIDER_SITE_OTHER): Payer: 59 | Admitting: Podiatry

## 2023-04-09 DIAGNOSIS — M79675 Pain in left toe(s): Secondary | ICD-10-CM | POA: Diagnosis not present

## 2023-04-09 DIAGNOSIS — E1149 Type 2 diabetes mellitus with other diabetic neurological complication: Secondary | ICD-10-CM

## 2023-04-09 DIAGNOSIS — M79674 Pain in right toe(s): Secondary | ICD-10-CM

## 2023-04-09 DIAGNOSIS — B351 Tinea unguium: Secondary | ICD-10-CM | POA: Diagnosis not present

## 2023-04-09 DIAGNOSIS — E1140 Type 2 diabetes mellitus with diabetic neuropathy, unspecified: Secondary | ICD-10-CM

## 2023-04-09 NOTE — Progress Notes (Signed)
This patient returns to my office for at risk foot care.  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  This patient is unable to cut nails himself since the patient cannot reach his nails.These nails are painful walking and wearing shoes.  This patient presents for at risk foot care today.  General Appearance  Alert, conversant and in no acute stress.  Vascular  Dorsalis pedis and posterior tibial  pulses are palpable  bilaterally.  Capillary return is within normal limits  bilaterally. Temperature is within normal limits  bilaterally.  Neurologic  Senn-Weinstein monofilament wire test within normal limits  bilaterally. Muscle power within normal limits bilaterally.  Nails Thick disfigured discolored nails with subungual debris  from hallux to fifth toes bilaterally. No evidence of bacterial infection or drainage bilaterally.  Orthopedic  No limitations of motion  feet .  No crepitus or effusions noted.  No bony pathology or digital deformities noted. Mild HAV  B/L.  ADV 5th digit  B/L.  Skin  normotropic skin with no porokeratosis noted bilaterally.  No signs of infections or ulcers noted.     Onychomycosis  Pain in right toes  Pain in left toes  Consent was obtained for treatment procedures.   Mechanical debridement of nails 1-5  bilaterally performed with a nail nipper.  Filed with dremel without incident.    Return office visit     3 months                 Told patient to return for periodic foot care and evaluation due to potential at risk complications.     DPM  

## 2023-07-10 ENCOUNTER — Encounter: Payer: Self-pay | Admitting: Podiatry

## 2023-07-10 ENCOUNTER — Ambulatory Visit (INDEPENDENT_AMBULATORY_CARE_PROVIDER_SITE_OTHER): Payer: 59 | Admitting: Podiatry

## 2023-07-10 DIAGNOSIS — E1140 Type 2 diabetes mellitus with diabetic neuropathy, unspecified: Secondary | ICD-10-CM

## 2023-07-10 DIAGNOSIS — M79674 Pain in right toe(s): Secondary | ICD-10-CM | POA: Diagnosis not present

## 2023-07-10 DIAGNOSIS — B351 Tinea unguium: Secondary | ICD-10-CM

## 2023-07-10 DIAGNOSIS — E1149 Type 2 diabetes mellitus with other diabetic neurological complication: Secondary | ICD-10-CM | POA: Diagnosis not present

## 2023-07-10 DIAGNOSIS — M79675 Pain in left toe(s): Secondary | ICD-10-CM | POA: Diagnosis not present

## 2023-07-10 NOTE — Progress Notes (Signed)
This patient returns to my office for at risk foot care.  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  This patient is unable to cut nails himself since the patient cannot reach his nails.These nails are painful walking and wearing shoes.  This patient presents for at risk foot care today.  General Appearance  Alert, conversant and in no acute stress.  Vascular  Dorsalis pedis and posterior tibial  pulses are palpable  bilaterally.  Capillary return is within normal limits  bilaterally. Temperature is within normal limits  bilaterally.  Neurologic  Senn-Weinstein monofilament wire test within normal limits  bilaterally. Muscle power within normal limits bilaterally.  Nails Thick disfigured discolored nails with subungual debris  from hallux to fifth toes bilaterally. No evidence of bacterial infection or drainage bilaterally.  Orthopedic  No limitations of motion  feet .  No crepitus or effusions noted.  No bony pathology or digital deformities noted. Mild HAV  B/L.  ADV 5th digit  B/L.  Skin  normotropic skin with no porokeratosis noted bilaterally.  No signs of infections or ulcers noted.     Onychomycosis  Pain in right toes  Pain in left toes  Consent was obtained for treatment procedures.   Mechanical debridement of nails 1-5  bilaterally performed with a nail nipper.  Filed with dremel without incident.    Return office visit     3 months                 Told patient to return for periodic foot care and evaluation due to potential at risk complications.   Gregory Mayer DPM  

## 2023-07-13 ENCOUNTER — Emergency Department (HOSPITAL_BASED_OUTPATIENT_CLINIC_OR_DEPARTMENT_OTHER): Payer: 59

## 2023-07-13 ENCOUNTER — Encounter (HOSPITAL_BASED_OUTPATIENT_CLINIC_OR_DEPARTMENT_OTHER): Payer: Self-pay | Admitting: Emergency Medicine

## 2023-07-13 ENCOUNTER — Emergency Department (HOSPITAL_BASED_OUTPATIENT_CLINIC_OR_DEPARTMENT_OTHER)
Admission: EM | Admit: 2023-07-13 | Discharge: 2023-07-14 | Disposition: A | Discharge status: Home / Self Care | Payer: 59 | Source: Home / Self Care | Attending: Emergency Medicine | Admitting: Emergency Medicine

## 2023-07-13 ENCOUNTER — Other Ambulatory Visit: Payer: Self-pay

## 2023-07-13 DIAGNOSIS — N189 Chronic kidney disease, unspecified: Secondary | ICD-10-CM | POA: Diagnosis not present

## 2023-07-13 DIAGNOSIS — Z7901 Long term (current) use of anticoagulants: Secondary | ICD-10-CM | POA: Diagnosis not present

## 2023-07-13 DIAGNOSIS — R42 Dizziness and giddiness: Secondary | ICD-10-CM | POA: Diagnosis present

## 2023-07-13 DIAGNOSIS — E876 Hypokalemia: Secondary | ICD-10-CM | POA: Insufficient documentation

## 2023-07-13 DIAGNOSIS — E1122 Type 2 diabetes mellitus with diabetic chronic kidney disease: Secondary | ICD-10-CM | POA: Insufficient documentation

## 2023-07-13 DIAGNOSIS — G20C Parkinsonism, unspecified: Secondary | ICD-10-CM | POA: Insufficient documentation

## 2023-07-13 DIAGNOSIS — Z7984 Long term (current) use of oral hypoglycemic drugs: Secondary | ICD-10-CM | POA: Diagnosis not present

## 2023-07-13 LAB — PROTIME-INR
INR: 1.1 (ref 0.8–1.2)
Prothrombin Time: 13.9 seconds (ref 11.4–15.2)

## 2023-07-13 LAB — CBC
HCT: 40.7 % (ref 39.0–52.0)
Hemoglobin: 13.5 g/dL (ref 13.0–17.0)
MCH: 30.5 pg (ref 26.0–34.0)
MCHC: 33.2 g/dL (ref 30.0–36.0)
MCV: 91.9 fL (ref 80.0–100.0)
Platelets: 175 10*3/uL (ref 150–400)
RBC: 4.43 MIL/uL (ref 4.22–5.81)
RDW: 13.4 % (ref 11.5–15.5)
WBC: 7.3 10*3/uL (ref 4.0–10.5)
nRBC: 0 % (ref 0.0–0.2)

## 2023-07-13 LAB — DIFFERENTIAL
Abs Immature Granulocytes: 0.02 10*3/uL (ref 0.00–0.07)
Basophils Absolute: 0 10*3/uL (ref 0.0–0.1)
Basophils Relative: 1 %
Eosinophils Absolute: 0.1 10*3/uL (ref 0.0–0.5)
Eosinophils Relative: 2 %
Immature Granulocytes: 0 %
Lymphocytes Relative: 33 %
Lymphs Abs: 2.4 10*3/uL (ref 0.7–4.0)
Monocytes Absolute: 0.8 10*3/uL (ref 0.1–1.0)
Monocytes Relative: 10 %
Neutro Abs: 4 10*3/uL (ref 1.7–7.7)
Neutrophils Relative %: 54 %

## 2023-07-13 LAB — CBG MONITORING, ED: Glucose-Capillary: 135 mg/dL — ABNORMAL HIGH (ref 70–99)

## 2023-07-13 LAB — COMPREHENSIVE METABOLIC PANEL
ALT: 11 U/L (ref 0–44)
AST: 28 U/L (ref 15–41)
Albumin: 3.6 g/dL (ref 3.5–5.0)
Alkaline Phosphatase: 73 U/L (ref 38–126)
Anion gap: 10 (ref 5–15)
BUN: 23 mg/dL (ref 8–23)
CO2: 27 mmol/L (ref 22–32)
Calcium: 9.1 mg/dL (ref 8.9–10.3)
Chloride: 102 mmol/L (ref 98–111)
Creatinine, Ser: 1.79 mg/dL — ABNORMAL HIGH (ref 0.61–1.24)
GFR, Estimated: 41 mL/min — ABNORMAL LOW (ref >60)
Glucose, Bld: 134 mg/dL — ABNORMAL HIGH (ref 70–99)
Potassium: 3.1 mmol/L — ABNORMAL LOW (ref 3.5–5.1)
Sodium: 139 mmol/L (ref 135–145)
Total Bilirubin: 0.4 mg/dL (ref 0.3–1.2)
Total Protein: 7.8 g/dL (ref 6.5–8.1)

## 2023-07-13 LAB — TROPONIN I (HIGH SENSITIVITY): Troponin I (High Sensitivity): 8 ng/L (ref <18)

## 2023-07-13 LAB — APTT: aPTT: 32 seconds (ref 24–36)

## 2023-07-13 LAB — ETHANOL: Alcohol, Ethyl (B): <10 mg/dL (ref <10)

## 2023-07-13 MED ORDER — LACTATED RINGERS IV BOLUS
1000.0000 mL | Freq: Once | INTRAVENOUS | Status: AC
  Administered 2023-07-13: 1000 mL via INTRAVENOUS

## 2023-07-13 MED ORDER — POTASSIUM CHLORIDE 10 MEQ/100ML IV SOLN
10.0000 meq | Freq: Once | INTRAVENOUS | Status: AC
  Administered 2023-07-14: 10 meq via INTRAVENOUS
  Filled 2023-07-13: qty 100

## 2023-07-13 MED ORDER — MAGNESIUM SULFATE 2 GM/50ML IV SOLN
2.0000 g | Freq: Once | INTRAVENOUS | Status: AC
  Administered 2023-07-14: 2 g via INTRAVENOUS
  Filled 2023-07-13: qty 50

## 2023-07-13 MED ORDER — POTASSIUM CHLORIDE CRYS ER 20 MEQ PO TBCR
40.0000 meq | EXTENDED_RELEASE_TABLET | Freq: Once | ORAL | Status: AC
  Administered 2023-07-14: 40 meq via ORAL
  Filled 2023-07-13: qty 2

## 2023-07-13 NOTE — ED Triage Notes (Addendum)
Acute onset dizziness, elevated HR that started at 2030 and has been intermittent since. CBG 135. Pt reports personal hx of Afib. He reports some chest "discomfort" that he rates 3/10. He states the dizziness has occurred 3x since the initial onset. EDP at bedside. Pt has parkinson's and has baseline gait problems due to this. VSS, no acute distress. Pt is able to provide his own history without difficulty. No notable facial droop or drooling. Pt does take Eliquis for Afib. Ambulatory in waiting room but brought to room in wheelchair. Visitor stated gait was not different from baseline. Per EDP no activation of Code Stroke at this time.

## 2023-07-13 NOTE — ED Notes (Signed)
Lab notified of add on Magnesium level

## 2023-07-13 NOTE — ED Provider Notes (Signed)
[place] [facility]PART[facility] AT [facility] Provider Note   CSN: [id number] Arrival date & time: 07/13/23  2237     History {Add pertinent medical, surgical, social history, OB history to HPI:1} Chief Complaint  Patient presents with   Dizziness    Edwin Gallagher is a 67 y.o. male.  67 year old male with history of Parkinson's on Sinemet, paroxysmal A-fib on diltiazem and Eliquis, diabetes, pretension hyperlipidemia that presents the ER today secondary to dizziness and tachycardia.  Patient states he was working outside a lot today and while he was doing that he would have intermittent episodes of head and would be able to sit down and was slowly get better.  He thought this was likely related to working outside and being dehydrated.  However tonight when he was resting watching TV he had random episodes of his heart rate going up to 1 20-1 30 and it would bounce around between 130 and 100 for few minutes and go back to normal.  He states he has had 1 episode of atrial fibrillation according to his watch in the past but I did not want him of that tonight.  He was not excited or nervous or anxious at all when this was happening.  No other symptoms.  No neurologic symptoms that are new from baseline.  No recent illnesses.  Normal urination normal intake.   Dizziness      Home Medications Prior to Admission medications   Medication Sig Start Date End Date Taking? Authorizing Provider  amLODipine (NORVASC) 10 MG tablet Take 10 mg by mouth daily.    [provider]  apixaban (ELIQUIS) 5 MG TABS tablet Take 1 tablet (5 mg total) by mouth 2 (two) times daily. 06/25/18   Newman Nip, NP  carbidopa-levodopa (SINEMET IR) 25-250 MG tablet Take 1 tablet by mouth 3 (three) times daily. 12/31/22 12/31/23  [provider]  diltiazem (CARDIZEM) 30 MG tablet TAKE 1 TABLET EVERY 4 HOURS AS NEEDED FOR AFIB HEART RATE >100 07/14/19   Newman Nip, NP  doxazosin (CARDURA) 4  MG tablet Take 4 mg by mouth daily.    [provider]  gabapentin (NEURONTIN) 100 MG capsule Taking 200mg  by mouth as needed for pain 03/21/22   [provider]  glimepiride (AMARYL) 4 MG tablet Take 4 mg by mouth daily.    [provider]  hydrocortisone 2.5 % cream Apply topically. 09/06/20   [provider]  losartan-hydrochlorothiazide (HYZAAR) 100-25 MG tablet Take 0.5 tablets by mouth daily.    [provider]  metFORMIN (GLUCOPHAGE) 500 MG tablet Take 500 mg by mouth daily.    [provider]  metoprolol succinate (TOPROL-XL) 100 MG 24 hr tablet Take 1 tablet (100 mg total) by mouth daily. 01/07/20   Dunn, Tacey Ruiz, PA-C  ONETOUCH ULTRA test strip USE TO TEST BLOOD SUGAR TWICE DAILY 02/22/20   [provider]  simvastatin (ZOCOR) 20 MG tablet Take 20 mg by mouth daily.  06/14/17   [provider]  traMADol (ULTRAM) 50 MG tablet Take 50 mg by mouth as needed.    [provider]      Allergies    Patient has no known allergies.    Review of Systems   Review of Systems  Neurological:  Positive for dizziness.    Physical Exam Updated Vital Signs BP 136/75 (BP Location: Right Arm)   Pulse 78   Resp 20   Ht 6' (1.829 m)  Wt 119.3 kg   SpO2 98%   BMI 35.67 kg/m  Physical Exam Vitals and nursing note reviewed.  Constitutional:      Appearance: He is well-developed.  HENT:     Head: Normocephalic and atraumatic.  Cardiovascular:     Rate and Rhythm: Normal rate.  Pulmonary:     Effort: Pulmonary effort is normal. No respiratory distress.  Abdominal:     General: There is no distension.  Musculoskeletal:        General: Normal range of motion.     Cervical back: Normal range of motion.  Neurological:     Mental Status: He is alert. Mental status is at baseline.     Comments: Mild left hand tremor which apparently is his baseline with Parkinson's.     ED Results / Procedures / Treatments    Labs (all labs ordered are listed, but only abnormal results are displayed) Labs Reviewed  CBG MONITORING, ED - Abnormal; Notable for the following components:      Result Value   Glucose-Capillary 135 (*)    All other components within normal limits  ETHANOL  PROTIME-INR  APTT  CBC  DIFFERENTIAL  COMPREHENSIVE METABOLIC PANEL  RAPID URINE DRUG SCREEN, HOSP PERFORMED  URINALYSIS, ROUTINE W REFLEX MICROSCOPIC  TROPONIN I (HIGH SENSITIVITY)    EKG EKG Interpretation Date/Time:  Saturday July 13 2023 22:50:15 EDT Ventricular Rate:  76 PR Interval:  157 QRS Duration:  88 QT Interval:  405 QTC Calculation: 456 R Axis:   62  Text Interpretation: Sinus rhythm Confirmed by Marily Memos [phone]) on 07/13/2023 10:57:04 PM  Radiology No results found.  Procedures Procedures  {Document cardiac monitor, telemetry assessment procedure when appropriate:1}  Medications Ordered in ED Medications - No data to display  ED Course/ Medical Decision Making/ A&P   {   Click here for ABCD2, HEART and other calculatorsREFRESH Note before signing :1}                              Medical Decision Making  The way he describes a sudden spike and then a sudden normalization of his heart rate in the range that he had while it was elevated does make me think he was probably having proximal episodes of atrial fibrillation.  He appears to be in a sinus rhythm right now with PACs on his monitor and his EKG looks like sinus with some possible a flutter that is being suppressed.  Patient states that sometimes he does not take his Cardizem as prescribed but he did today.  Plan for CT to evaluate for any intracranial causes for his dizziness and electrolytes.  Will give him some fluid in the meantime presuming he might be slightly dehydrated with the outside work.  ***  {Document critical care time when appropriate:1} {Document review of labs and clinical decision tools ie heart score, Chads2Vasc2  etc:1}  {Document your independent review of radiology images, and any outside records:1} {Document your discussion with family members, caretakers, and with consultants:1} {Document social determinants of health affecting pt's care:1} {Document your decision making why or why not admission, treatments were needed:1} Final Clinical Impression(s) / ED Diagnoses Final diagnoses:  None    Rx / DC Orders ED Discharge Orders     None

## 2023-07-13 NOTE — ED Provider Notes (Signed)
MSE was initiated and I personally evaluated the patient and placed orders (if any) at  10:57 PM on July 13, 2023.  The patient appears stable so that the remainder of the MSE may be completed by another provider.  67 year old male history of A-fib on Eliquis, Parkinson's disease.  He is brought in by family member for evaluation of feeling dizzy lightheaded and also unsteady on his feet that started around 8 PM tonight.  He said his symptoms have come and gone.  He has not fallen.  He also feels a little bit of tightness in his chest and tightness in the back of his neck.  He wonders if his A-fib is acting up.  Initial EKG shows sinus rhythm rate of 76.  Normal clear STEMI he does not have any focal findings on neuroexam.  Will hold on stroke activation and initiate workup.   Terrilee Files, MD 07/14/23 [phone]

## 2023-07-13 NOTE — ED Notes (Signed)
Patient transported to CT 

## 2023-07-14 LAB — URINALYSIS, ROUTINE W REFLEX MICROSCOPIC
Bilirubin Urine: NEGATIVE
Glucose, UA: NEGATIVE mg/dL
Hgb urine dipstick: NEGATIVE
Ketones, ur: NEGATIVE mg/dL
Nitrite: NEGATIVE
Protein, ur: NEGATIVE mg/dL
Specific Gravity, Urine: 1.02 (ref 1.005–1.030)
pH: 6 (ref 5.0–8.0)

## 2023-07-14 LAB — RAPID URINE DRUG SCREEN, HOSP PERFORMED
Amphetamines: NOT DETECTED
Barbiturates: NOT DETECTED
Benzodiazepines: NOT DETECTED
Cocaine: NOT DETECTED
Opiates: NOT DETECTED
Tetrahydrocannabinol: NOT DETECTED

## 2023-07-14 LAB — URINALYSIS, MICROSCOPIC (REFLEX): Bacteria, UA: NONE SEEN

## 2023-07-14 LAB — MAGNESIUM: Magnesium: 2.1 mg/dL (ref 1.7–2.4)

## 2023-07-14 LAB — TROPONIN I (HIGH SENSITIVITY): Troponin I (High Sensitivity): 9 ng/L (ref <18)

## 2023-07-14 MED ORDER — SODIUM CHLORIDE 0.9 % IV SOLN: INTRAVENOUS | Status: DC | PRN

## 2023-07-14 MED ORDER — POTASSIUM CHLORIDE CRYS ER 20 MEQ PO TBCR: 20.0000 meq | EXTENDED_RELEASE_TABLET | Freq: Every day | ORAL | 0 refills | Status: AC

## 2023-07-14 NOTE — ED Notes (Signed)
Pt ambulatory with no issue.

## 2023-09-30 ENCOUNTER — Encounter: Payer: Self-pay | Admitting: Podiatry

## 2023-09-30 ENCOUNTER — Ambulatory Visit (INDEPENDENT_AMBULATORY_CARE_PROVIDER_SITE_OTHER): Payer: 59 | Admitting: Podiatry

## 2023-09-30 DIAGNOSIS — E1140 Type 2 diabetes mellitus with diabetic neuropathy, unspecified: Secondary | ICD-10-CM | POA: Diagnosis not present

## 2023-09-30 DIAGNOSIS — M79674 Pain in right toe(s): Secondary | ICD-10-CM

## 2023-09-30 DIAGNOSIS — D689 Coagulation defect, unspecified: Secondary | ICD-10-CM | POA: Diagnosis not present

## 2023-09-30 DIAGNOSIS — E1149 Type 2 diabetes mellitus with other diabetic neurological complication: Secondary | ICD-10-CM

## 2023-09-30 DIAGNOSIS — B351 Tinea unguium: Secondary | ICD-10-CM | POA: Diagnosis not present

## 2023-09-30 DIAGNOSIS — M79675 Pain in left toe(s): Secondary | ICD-10-CM

## 2023-09-30 NOTE — Progress Notes (Signed)
This patient returns to my office for at risk foot care.  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  This patient is unable to cut nails himself since the patient cannot reach his nails.These nails are painful walking and wearing shoes.  This patient presents for at risk foot care today.  General Appearance  Alert, conversant and in no acute stress.  Vascular  Dorsalis pedis and posterior tibial  pulses are palpable  bilaterally.  Capillary return is within normal limits  bilaterally. Temperature is within normal limits  bilaterally.  Neurologic  Senn-Weinstein monofilament wire test within normal limits  bilaterally. Muscle power within normal limits bilaterally.  Nails Thick disfigured discolored nails with subungual debris  from hallux to fifth toes bilaterally. No evidence of bacterial infection or drainage bilaterally.  Orthopedic  No limitations of motion  feet .  No crepitus or effusions noted.  No bony pathology or digital deformities noted. Mild HAV  B/L.  ADV 5th digit  B/L.  Skin  normotropic skin with no porokeratosis noted bilaterally.  No signs of infections or ulcers noted.     Onychomycosis  Pain in right toes  Pain in left toes  Consent was obtained for treatment procedures.   Mechanical debridement of nails 1-5  bilaterally performed with a nail nipper.  Filed with dremel without incident.    Return office visit     3 months                 Told patient to return for periodic foot care and evaluation due to potential at risk complications.     DPM  

## 2023-10-01 NOTE — Progress Notes (Unsigned)
HPI male never smoker followed for OSA, REM Behavior Disorder, complicated by Parkinson's,  Obesity, DM 2, HBP, PAFib, Hyperlipidemia, NPSG 2012 AHI 85/ hr, CPAP titrated to 14 HST-09/24/17-AHI 20.9/hour, desaturation to 84%, body weight 289 pounds  --------------------------------------  04/02/23- 67 year old male never smoker followed for OSA, REM Behavior Disorder, complicated by Parkinson's (Neurology), Obesity, DM 2, HTN, CAD, PAFib/ Eliquis, Hyperlipidemia,  CPAP titration study 07/14/20- 14 cwp, Parkinson's, + RBD with screaming, raising arms during REM -Sinemet IR 25-250, Melatonin 10 mg for RBD, CPAP auto 5-14/ [facility]      AirSense 10 AutoSet Download- compliance not using recently Body weight today- -----Hasn't used cpap machine in several weeks He is trying to cope with Parkinson issues and dropped off CPAP for a while during respiratory infection earlier this year.  Neurology is managing his Parkinson's/REM behavior disorder.  Melatonin has not made a big difference in parasomnias yet at 10 mg per night.  We discussed trying 15 mg. He asks for a longer hose for his CPAP machine to allow movement in bed and changes of sleep position more comfortably.  It is time to replace his old machine. CXR 12/29/22- IMPRESSION: No acute cardiopulmonary process.  10/03/23- 67 year old male never smoker followed for OSA, REM Behavior Disorder, complicated by Parkinson's (Neurology), Obesity, DM 2, HTN, CAD, PAFib/ Eliquis, Hyperlipidemia,  CPAP titration study 07/14/20- 14 cwp, Parkinson's, + RBD with screaming, raising arms during REM Neurology is managing his Parkinson's/REM behavior disorder. -Sinemet IR 25-250, Melatonin 10 mg for RBD, CPAP auto 5-14/ [facility]      AirSense 10 AutoSet Download- compliance- 30%, AHI 0.9/hr Body weight today- Had flu vax  CXR 07/14/23- IMPRESSION: No active disease.  ROS-see HPI   + = positive Constitutional:    weight loss, night sweats, fevers, chills, fatigue,  lassitude. HEENT:    headaches, difficulty swallowing, tooth/dental problems, sore throat,       sneezing, itching, ear ache, nasal congestion, post nasal drip, snoring CV:    chest pain, orthopnea, PND, swelling in lower extremities, anasarca,                                                          dizziness, palpitations Resp:   shortness of breath with exertion or at rest.                productive cough,   non-productive cough, coughing up of blood.              change in color of mucus.  wheezing.    Skin:    rash or lesions. GI:  No-   heartburn, indigestion, abdominal pain, nausea, vomiting, diarrhea,                 change in bowel habits, loss of appetite GU: dysuria, change in color of urine, + nocturia.   flank pain. MS:   joint pain, stiffness, decreased range of motion,+ back pain. Neuro-     + HPI Psych:  change in mood or affect.  depression or anxiety.   memory loss.  OBJ- Physical Exam General- Alert, Oriented, Affect-appropriate, Distress- none acute, + Obese Skin- rash-none, lesions- none, excoriation- none Lymphadenopathy- none Head- atraumatic            Eyes- Gross vision intact, PERRLA, conjunctivae and secretions clear  Ears- Hearing, canals-normal            Nose- Clear, no-Septal dev, mucus, polyps, erosion, perforation             Throat- Mallampati III , mucosa clear , drainage- none, tonsils- atrophic Neck- flexible , trachea midline, no stridor , thyroid nl, carotid no bruit Chest - symmetrical excursion , unlabored           Heart/CV- RRR at this visit , no murmur , no gallop  , no rub, nl s1 s2                           - JVD- none , edema- none, stasis changes- none, varices- none           Lung- clear to P&A, wheeze- none, cough- none , dullness-none, rub- none           Chest wall-  Abd-  Br/ Gen/ Rectal- Not done, not indicated Extrem- cyanosis- none, clubbing, none, atrophy- none, strength- nl Neuro-  + resting tremor L hand, speech a  little slow, +flat facies

## 2023-10-03 ENCOUNTER — Ambulatory Visit: Payer: 59 | Admitting: Internal Medicine

## 2023-10-03 ENCOUNTER — Encounter: Payer: Self-pay | Admitting: Internal Medicine

## 2023-10-03 VITALS — BP 118/68 | HR 70 | Ht 72.0 in | Wt 256.0 lb

## 2023-10-03 DIAGNOSIS — G4733 Obstructive sleep apnea (adult) (pediatric): Secondary | ICD-10-CM | POA: Diagnosis not present

## 2023-10-03 DIAGNOSIS — G20A1 Parkinson's disease without dyskinesia, without mention of fluctuations: Secondary | ICD-10-CM

## 2023-10-03 NOTE — Patient Instructions (Signed)
Order- DME Christoper Allegra- please change CPAP auto range to 4-10  If this adjustment isn't more comfortable. Please let me know.

## 2023-11-20 ENCOUNTER — Encounter: Payer: Self-pay | Admitting: Internal Medicine

## 2023-11-20 NOTE — Assessment & Plan Note (Addendum)
 Benefits from CPAP, but working on comfort and compliance Plan- reduce auto range to 4-10

## 2023-11-20 NOTE — Assessment & Plan Note (Signed)
 Managed by Neurology REM Behavior Disorder symptoms improved on Rx

## 2023-12-18 DIAGNOSIS — G4733 Obstructive sleep apnea (adult) (pediatric): Secondary | ICD-10-CM | POA: Diagnosis not present

## 2023-12-31 ENCOUNTER — Ambulatory Visit: Payer: 59 | Admitting: Podiatry

## 2024-01-15 DIAGNOSIS — G4733 Obstructive sleep apnea (adult) (pediatric): Secondary | ICD-10-CM | POA: Diagnosis not present

## 2024-01-16 DIAGNOSIS — I10 Essential (primary) hypertension: Secondary | ICD-10-CM | POA: Diagnosis not present

## 2024-01-16 DIAGNOSIS — I480 Paroxysmal atrial fibrillation: Secondary | ICD-10-CM | POA: Diagnosis not present

## 2024-01-16 DIAGNOSIS — N1831 Chronic kidney disease, stage 3a: Secondary | ICD-10-CM | POA: Diagnosis not present

## 2024-01-16 DIAGNOSIS — E1169 Type 2 diabetes mellitus with other specified complication: Secondary | ICD-10-CM | POA: Diagnosis not present

## 2024-01-16 DIAGNOSIS — L989 Disorder of the skin and subcutaneous tissue, unspecified: Secondary | ICD-10-CM | POA: Diagnosis not present

## 2024-01-16 DIAGNOSIS — R972 Elevated prostate specific antigen [PSA]: Secondary | ICD-10-CM | POA: Diagnosis not present

## 2024-01-16 DIAGNOSIS — G20A1 Parkinson's disease without dyskinesia, without mention of fluctuations: Secondary | ICD-10-CM | POA: Diagnosis not present

## 2024-01-16 DIAGNOSIS — N401 Enlarged prostate with lower urinary tract symptoms: Secondary | ICD-10-CM | POA: Diagnosis not present

## 2024-01-16 DIAGNOSIS — E785 Hyperlipidemia, unspecified: Secondary | ICD-10-CM | POA: Diagnosis not present

## 2024-01-16 DIAGNOSIS — R0789 Other chest pain: Secondary | ICD-10-CM | POA: Diagnosis not present

## 2024-01-16 DIAGNOSIS — R351 Nocturia: Secondary | ICD-10-CM | POA: Diagnosis not present

## 2024-01-17 ENCOUNTER — Ambulatory Visit: Payer: 59 | Admitting: Podiatry

## 2024-01-21 ENCOUNTER — Ambulatory Visit (INDEPENDENT_AMBULATORY_CARE_PROVIDER_SITE_OTHER): Admitting: Podiatry

## 2024-01-21 ENCOUNTER — Encounter: Payer: Self-pay | Admitting: Podiatry

## 2024-01-21 DIAGNOSIS — M79675 Pain in left toe(s): Secondary | ICD-10-CM

## 2024-01-21 DIAGNOSIS — B351 Tinea unguium: Secondary | ICD-10-CM | POA: Diagnosis not present

## 2024-01-21 DIAGNOSIS — E1140 Type 2 diabetes mellitus with diabetic neuropathy, unspecified: Secondary | ICD-10-CM | POA: Diagnosis not present

## 2024-01-21 DIAGNOSIS — E1149 Type 2 diabetes mellitus with other diabetic neurological complication: Secondary | ICD-10-CM | POA: Diagnosis not present

## 2024-01-21 DIAGNOSIS — D689 Coagulation defect, unspecified: Secondary | ICD-10-CM

## 2024-01-21 DIAGNOSIS — M79674 Pain in right toe(s): Secondary | ICD-10-CM

## 2024-01-21 NOTE — Progress Notes (Signed)
This patient returns to my office for at risk foot care.  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  This patient is unable to cut nails himself since the patient cannot reach his nails.These nails are painful walking and wearing shoes.  This patient presents for at risk foot care today.  General Appearance  Alert, conversant and in no acute stress.  Vascular  Dorsalis pedis and posterior tibial  pulses are palpable  bilaterally.  Capillary return is within normal limits  bilaterally. Temperature is within normal limits  bilaterally.  Neurologic  Senn-Weinstein monofilament wire test within normal limits  bilaterally. Muscle power within normal limits bilaterally.  Nails Thick disfigured discolored nails with subungual debris  from hallux to fifth toes bilaterally. No evidence of bacterial infection or drainage bilaterally.  Orthopedic  No limitations of motion  feet .  No crepitus or effusions noted.  No bony pathology or digital deformities noted. Mild HAV  B/L.  ADV 5th digit  B/L.  Skin  normotropic skin with no porokeratosis noted bilaterally.  No signs of infections or ulcers noted.     Onychomycosis  Pain in right toes  Pain in left toes  Consent was obtained for treatment procedures.   Mechanical debridement of nails 1-5  bilaterally performed with a nail nipper.  Filed with dremel without incident.    Return office visit     3 months                 Told patient to return for periodic foot care and evaluation due to potential at risk complications.     DPM  

## 2024-02-03 DIAGNOSIS — G4733 Obstructive sleep apnea (adult) (pediatric): Secondary | ICD-10-CM | POA: Diagnosis not present

## 2024-02-15 DIAGNOSIS — G4733 Obstructive sleep apnea (adult) (pediatric): Secondary | ICD-10-CM | POA: Diagnosis not present

## 2024-03-05 DIAGNOSIS — G20A1 Parkinson's disease without dyskinesia, without mention of fluctuations: Secondary | ICD-10-CM | POA: Diagnosis not present

## 2024-03-05 DIAGNOSIS — R1312 Dysphagia, oropharyngeal phase: Secondary | ICD-10-CM | POA: Diagnosis not present

## 2024-03-05 DIAGNOSIS — R2689 Other abnormalities of gait and mobility: Secondary | ICD-10-CM | POA: Diagnosis not present

## 2024-03-05 DIAGNOSIS — R278 Other lack of coordination: Secondary | ICD-10-CM | POA: Diagnosis not present

## 2024-03-05 DIAGNOSIS — R471 Dysarthria and anarthria: Secondary | ICD-10-CM | POA: Diagnosis not present

## 2024-03-05 DIAGNOSIS — G20B1 Parkinson's disease with dyskinesia, without mention of fluctuations: Secondary | ICD-10-CM | POA: Diagnosis not present

## 2024-03-16 DIAGNOSIS — G4733 Obstructive sleep apnea (adult) (pediatric): Secondary | ICD-10-CM | POA: Diagnosis not present

## 2024-03-20 DIAGNOSIS — E119 Type 2 diabetes mellitus without complications: Secondary | ICD-10-CM | POA: Diagnosis not present

## 2024-03-20 DIAGNOSIS — H524 Presbyopia: Secondary | ICD-10-CM | POA: Diagnosis not present

## 2024-03-20 DIAGNOSIS — H52223 Regular astigmatism, bilateral: Secondary | ICD-10-CM | POA: Diagnosis not present

## 2024-03-20 DIAGNOSIS — Z135 Encounter for screening for eye and ear disorders: Secondary | ICD-10-CM | POA: Diagnosis not present

## 2024-03-20 DIAGNOSIS — H5203 Hypermetropia, bilateral: Secondary | ICD-10-CM | POA: Diagnosis not present

## 2024-04-16 DIAGNOSIS — G4733 Obstructive sleep apnea (adult) (pediatric): Secondary | ICD-10-CM | POA: Diagnosis not present

## 2024-04-22 ENCOUNTER — Ambulatory Visit: Admitting: Podiatry

## 2024-04-23 DIAGNOSIS — R591 Generalized enlarged lymph nodes: Secondary | ICD-10-CM | POA: Diagnosis not present

## 2024-04-23 DIAGNOSIS — K1121 Acute sialoadenitis: Secondary | ICD-10-CM | POA: Diagnosis not present

## 2024-04-24 ENCOUNTER — Ambulatory Visit: Attending: Cardiology | Admitting: Cardiology

## 2024-04-24 ENCOUNTER — Other Ambulatory Visit: Payer: Self-pay

## 2024-04-24 ENCOUNTER — Encounter: Payer: Self-pay | Admitting: Cardiology

## 2024-04-24 VITALS — BP 114/68 | HR 91 | Ht 72.0 in | Wt 256.2 lb

## 2024-04-24 DIAGNOSIS — E782 Mixed hyperlipidemia: Secondary | ICD-10-CM | POA: Diagnosis not present

## 2024-04-24 DIAGNOSIS — R072 Precordial pain: Secondary | ICD-10-CM

## 2024-04-24 DIAGNOSIS — I480 Paroxysmal atrial fibrillation: Secondary | ICD-10-CM

## 2024-04-24 MED ORDER — ROSUVASTATIN CALCIUM 20 MG PO TABS: 20.0000 mg | ORAL_TABLET | Freq: Every day | ORAL | 3 refills | Status: AC

## 2024-04-24 MED ORDER — ASPIRIN 81 MG PO TBEC: 81.0000 mg | DELAYED_RELEASE_TABLET | Freq: Every day | ORAL | Status: AC

## 2024-04-24 MED ORDER — NITROGLYCERIN 0.4 MG SL SUBL: 0.4000 mg | SUBLINGUAL_TABLET | SUBLINGUAL | 3 refills | Status: AC | PRN

## 2024-04-24 NOTE — Patient Instructions (Signed)
 Medication Instructions:  Stop Zocor   START Crestor 20 mg daily  START AS NEEDED FOR CHEST PAIN: nitroglycerin   *If you need a refill on your cardiac medications before your next appointment, please call your pharmacy*  Testing/Procedures: Echo  Your physician has requested that you have an echocardiogram. Echocardiography is a painless test that uses sound waves to create images of your heart. It provides your doctor with information about the size and shape of your heart and how well your heart's chambers and valves are working. This procedure takes approximately one hour. There are no restrictions for this procedure. Please do NOT wear cologne, perfume, aftershave, or lotions (deodorant is allowed). Please arrive 15 minutes prior to your appointment time.  Please note: We ask at that you not bring children with you during ultrasound (echo/ vascular) testing. Due to room size and safety concerns, children are not allowed in the ultrasound rooms during exams. Our front office staff cannot provide observation of children in our lobby area while testing is being conducted. An adult accompanying a patient to their appointment will only be allowed in the ultrasound room at the discretion of the ultrasound technician under special circumstances. We apologize for any inconvenience.  Lexiscan   Your physician has requested that you have en exercise stress myoview . For further information please visit [email or web address] Please follow instruction sheet, as given.     Follow-Up: At [facility], you and your health needs are our priority.  As part of our continuing mission to provide you with exceptional heart care, our providers are all part of one team.  This team includes your primary Cardiologist (physician) and Advanced Practice Providers or APPs (Physician Assistants and Nurse Practitioners) who all work together to provide you with the care you need, when you need it.  Your next  appointment:   4-6 week(s)  Provider:   One of our Advanced Practice Providers (APPs) or Dr. Florestine Hurl, PA-C  Friddie Jetty, NP Evaline Hill, NP  Theotis Flake, PA-C Lawana Pray, NP  Willis Harter, PA-C Lovette Rud, PA-C  [place], PA-C Ernest Dick, NP  Marlana Silvan, NP Marcie Sever, PA-C  Laquita Plant, PA-C    Dayna Dunn, PA-C  Marlyse Single, PA-C Palmer Bobo, NP Katlyn West, NP Callie Goodrich, PA-C  Evan Williams, PA-C Sheng Haley, PA-C  Xika Zhao, NP Kathleen Johnson, PA-C

## 2024-04-24 NOTE — Addendum Note (Signed)
 Addended by: Miles Allan B on: 04/24/2024 07:45 PM   Modules accepted: Orders

## 2024-04-24 NOTE — Progress Notes (Addendum)
 Cardiology Office Note:  .   Date:  04/24/2024  ID:  Edwin Gallagher, DOB 1956/07/21, MRN [id number] PCP: Edwin Hasty, DO  [facility] Providers Cardiologist:  Edwin Ivy, MD PCP: Edwin Hasty, DO  Chief Complaint  Patient presents with   atypical chest pain    PAF     Edwin Gallagher is a 68 y.o. male with hypertension, hyperlipidemia, type 2 diabetes mellitus, PAF, Parkinson's disease, now with chest tightness  Discussed the use of AI scribe software for clinical note transcription with the patient, who gave verbal consent to proceed.  History of Present Illness Edwin Gallagher is a 68 year old male with hypertension, atrial fibrillation, and diabetes who presents with chest tightness during physical activity.  He experiences chest tightness during physical activity, which improves with rest. The last episode occurred while doing yard work in the heat. These episodes are sporadic. He takes diltiazem  as needed when his heart rate is elevated.  He has atrial fibrillation since 2019 and takes Eliquis  and diltiazem  for management. His heart rate can reach up to 120 bpm, but it has been a while since his last atrial fibrillation episode.  He manages diabetes with glimepiride and metformin, taking metformin based on blood sugar levels. His blood sugar was 146 after consuming sweet drinks but was 91 the following day without metformin.  He has hypertension and hyperlipidemia, for which he takes medications. No family history of heart disease. He does not currently smoke.      Vitals:   04/24/24 1406  BP: 114/68  Pulse: 91  SpO2: 95%      Review of Systems  Cardiovascular:  Positive for chest pain. Negative for dyspnea on exertion, leg swelling, palpitations and syncope.        Studies Reviewed: Edwin Gallagher    EKG 04/24/2024: Normal sinus rhythm Minimal voltage criteria for LVH, may be normal variant ( R in aVL ) Cannot rule out Inferior infarct , age undetermined When  compared with ECG of 13-Jul-2023 22:50,  criteria for inferior infarct are new    Independently interpreted 08/2023: Chol 167, TG 98, HDL 41, LDL 104 HbA1C 7.0% Hb 13.7 Cr 1.4 TSH 1.2  Risk Assessment/Calculations:    CHA2DS2-VASc Score = 3   This indicates a 3.2% annual risk of stroke. The patient's score is based upon: CHF History: 0 HTN History: 1 Diabetes History: 1 Stroke History: 0 Vascular Disease History: 0 Age Score: 1 Gender Score: 0     Physical Exam Vitals and nursing note reviewed.  Constitutional:      General: He is not in acute distress. Neck:     Vascular: No JVD.   Cardiovascular:     Rate and Rhythm: Normal rate and regular rhythm.     Heart sounds: Normal heart sounds. No murmur heard. Pulmonary:     Effort: Pulmonary effort is normal.     Breath sounds: Normal breath sounds. No wheezing or rales.   Musculoskeletal:     Right lower leg: No edema.     Left lower leg: No edema.      VISIT DIAGNOSES:   ICD-10-CM   1. Paroxysmal atrial fibrillation (HCC)  I48.0 EKG 12-Lead    ECHOCARDIOGRAM COMPLETE    2. Precordial pain  R07.2 Myocardial Perfusion Imaging       Edwin Gallagher is a 68 y.o. male with ypertension, hyperlipidemia, type 2 diabetes mellitus, PAF, Parkinson's disease, now with chest tightness Assessment & Plan Angina pectoris: Intermittent chest tightness with exertion,  suggestive of angina.  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  Recommend echocardiogram, and Lexiscan nuclear stress test in the setting of baseline abnormal EKG. Continue metoprolol  and diltiazem . Added aspirin 81 mg daily, and sublingual nitroglycerin as needed. Added Crestor 20 mg daily, goal LDL <70. Given that he is already on optimal medical management, if stress test shows significant abnormality, he may need cardiac catheterization in the near future.  PAF: Continue Eliquis , diltiazem  and  metoprolol .  Hypertension: Controlled.  Type 2 diabetes mellitus: Controlled.     Informed Consent   Shared Decision Making/Informed Consent The risks [chest pain, shortness of breath, cardiac arrhythmias, dizziness, blood pressure fluctuations, myocardial infarction, stroke/transient ischemic attack, nausea, vomiting, allergic reaction, radiation exposure, metallic taste sensation and life-threatening complications (estimated to be 1 in 10,000)], benefits (risk stratification, diagnosing coronary artery disease, treatment guidance) and alternatives of a nuclear stress test were discussed in detail with Edwin Gallagher and he agrees to proceed.       Meds ordered this encounter  Medications   rosuvastatin (CRESTOR) 20 MG tablet    Sig: Take 1 tablet (20 mg total) by mouth daily.    Dispense:  90 tablet    Refill:  3   nitroGLYCERIN (NITROSTAT) 0.4 MG SL tablet    Sig: Place 1 tablet (0.4 mg total) under the tongue every 5 (five) minutes as needed.    Dispense:  25 tablet    Refill:  3     F/u in 4-6 weeks  Signed, Cody Das, MD

## 2024-05-07 ENCOUNTER — Other Ambulatory Visit: Payer: Self-pay | Admitting: Cardiology

## 2024-05-07 DIAGNOSIS — R072 Precordial pain: Secondary | ICD-10-CM

## 2024-05-08 DIAGNOSIS — G4733 Obstructive sleep apnea (adult) (pediatric): Secondary | ICD-10-CM | POA: Diagnosis not present

## 2024-05-16 DIAGNOSIS — G4733 Obstructive sleep apnea (adult) (pediatric): Secondary | ICD-10-CM | POA: Diagnosis not present

## 2024-06-02 ENCOUNTER — Telehealth (HOSPITAL_COMMUNITY): Payer: Self-pay

## 2024-06-02 NOTE — Telephone Encounter (Signed)
 Spoke with the patient, detailed instructions given. S.Icela Glymph CCT

## 2024-06-09 ENCOUNTER — Ambulatory Visit (HOSPITAL_COMMUNITY)
Admission: RE | Admit: 2024-06-09 | Discharge: 2024-06-09 | Disposition: A | Discharge status: Home / Self Care | Source: Ambulatory Visit | Attending: Internal Medicine | Admitting: Internal Medicine

## 2024-06-09 ENCOUNTER — Ambulatory Visit (HOSPITAL_BASED_OUTPATIENT_CLINIC_OR_DEPARTMENT_OTHER)
Admission: RE | Admit: 2024-06-09 | Discharge: 2024-06-09 | Disposition: A | Discharge status: Home / Self Care | Source: Ambulatory Visit | Attending: Cardiology | Admitting: Cardiology

## 2024-06-09 DIAGNOSIS — R072 Precordial pain: Secondary | ICD-10-CM | POA: Diagnosis not present

## 2024-06-09 DIAGNOSIS — I480 Paroxysmal atrial fibrillation: Secondary | ICD-10-CM | POA: Diagnosis not present

## 2024-06-09 LAB — MYOCARDIAL PERFUSION IMAGING
LV dias vol: 94 mL (ref 62–150)
LV sys vol: 34 mL (ref 4.2–5.8)
Nuc Stress EF: 64 %
Peak HR: 88 {beats}/min
Rest HR: 68 {beats}/min
Rest Nuclear Isotope Dose: 12.6 mCi
SDS: 0
SRS: 0
SSS: 0
ST Depression (mm): 0 mm
Stress Nuclear Isotope Dose: 37 mCi
TID: 1.04

## 2024-06-09 LAB — ECHOCARDIOGRAM COMPLETE
Area-P 1/2: 2.99 cm2
S' Lateral: 2.9 cm

## 2024-06-09 MED ORDER — TECHNETIUM TC 99M TETROFOSMIN IV KIT
12.6000 | PACK | Freq: Once | INTRAVENOUS | Status: AC | PRN
  Administered 2024-06-09: 12.6 via INTRAVENOUS

## 2024-06-09 MED ORDER — REGADENOSON 0.4 MG/5ML IV SOLN
INTRAVENOUS | Status: AC
  Filled 2024-06-09: qty 5

## 2024-06-09 MED ORDER — TECHNETIUM TC 99M TETROFOSMIN IV KIT
37.0000 | PACK | Freq: Once | INTRAVENOUS | Status: AC | PRN
  Administered 2024-06-09: 37 via INTRAVENOUS

## 2024-06-09 MED ORDER — REGADENOSON 0.4 MG/5ML IV SOLN
0.4000 mg | Freq: Once | INTRAVENOUS | Status: AC
  Administered 2024-06-09: 0.4 mg via INTRAVENOUS

## 2024-06-11 ENCOUNTER — Ambulatory Visit: Attending: Nurse Practitioner | Admitting: Nurse Practitioner

## 2024-06-11 ENCOUNTER — Encounter: Payer: Self-pay | Admitting: Nurse Practitioner

## 2024-06-11 VITALS — BP 122/78 | HR 74 | Ht 72.0 in | Wt 257.0 lb

## 2024-06-11 DIAGNOSIS — E785 Hyperlipidemia, unspecified: Secondary | ICD-10-CM

## 2024-06-11 DIAGNOSIS — E119 Type 2 diabetes mellitus without complications: Secondary | ICD-10-CM

## 2024-06-11 DIAGNOSIS — G4733 Obstructive sleep apnea (adult) (pediatric): Secondary | ICD-10-CM | POA: Diagnosis not present

## 2024-06-11 DIAGNOSIS — I10 Essential (primary) hypertension: Secondary | ICD-10-CM

## 2024-06-11 DIAGNOSIS — I2510 Atherosclerotic heart disease of native coronary artery without angina pectoris: Secondary | ICD-10-CM | POA: Diagnosis not present

## 2024-06-11 DIAGNOSIS — I480 Paroxysmal atrial fibrillation: Secondary | ICD-10-CM

## 2024-06-11 NOTE — Patient Instructions (Signed)
 Medication Instructions:  Your physician recommends that you continue on your current medications as directed. Please refer to the Current Medication list given to you today.  *If you need a refill on your cardiac medications before your next appointment, please call your pharmacy*  Lab Work: CBC, BMET today  Testing/Procedures: NONE ordered at this time of appointment   Follow-Up: At [facility], you and your health needs are our priority.  As part of our continuing mission to provide you with exceptional heart care, our providers are all part of one team.  This team includes your primary Cardiologist (physician) and Advanced Practice Providers or APPs (Physician Assistants and Nurse Practitioners) who all work together to provide you with the care you need, when you need it.  Your next appointment:   6 month(s)  Provider:   Newman JINNY Lawrence, MD or Damien Braver, NP          We recommend signing up for the patient portal called MyChart.  Sign up information is provided on this After Visit Summary.  MyChart is used to connect with patients for Virtual Visits (Telemedicine).  Patients are able to view lab/test results, encounter notes, upcoming appointments, etc.  Non-urgent messages can be sent to your provider as well.   To learn more about what you can do with MyChart, go to [email or web address].

## 2024-06-11 NOTE — Progress Notes (Signed)
 Office Visit    Patient Name: Edwin Gallagher Date of Encounter: 06/11/2024  Primary Care Provider:  Valentin Skates, DO Primary Cardiologist:  Newman JINNY Lawrence, MD  Chief Complaint    68 year old male with a history of nonobstructive CAD, paroxysmal atrial fibrillation, hypertension, hyperlipidemia, type 2 diabetes, Parkinson's disease, OSA, and obesity who presents for follow-up related to CAD and atrial fibrillation.  Past Medical History    Past Medical History:  Diagnosis Date   Abnormal cardiac CT angiography 2006   only abnormal with soft plaque in LAD calcium  score is0    Abnormal LFTs    Balance problem    CAD (coronary artery disease)    a. has seen Dr. Morris in the past and had cardiac CT 2006 with calcium  score of 0. He had soft plaque in LAD of about 50%.  He had a normal stress echo in 2012. He also had a normal nuc in 2016.   Chest discomfort    Diabetes mellitus    Forgetfulness    Hyperlipidemia    Hypertension    Obesity    OSA (obstructive sleep apnea)    Paroxysmal atrial fibrillation (HCC)    Tremor    Past Surgical History:  Procedure Laterality Date   ROTATOR CUFF REPAIR  2009   Right   TRIGGER FINGER RELEASE  02/12/2012   Procedure: MINOR RELEASE TRIGGER FINGER/A-1 PULLEY;  Surgeon: Lamar LULLA Leonor Mickey., MD;  Location: [facility];  Service: Orthopedics;  Laterality: Right;  right thumb    Allergies  No Known Allergies   Labs/Other Studies Reviewed    The following studies were reviewed today:  Cardiac Studies & Procedures   ______________________________________________________________________________________________   STRESS TESTS  MYOCARDIAL PERFUSION IMAGING 06/09/2024  Interpretation Summary   The study is normal. The study is low risk.   No ST deviation was noted. The ECG was not diagnostic due to pharmacologic protocol.   LV perfusion is normal.   Left ventricular function is normal. End diastolic cavity size is  normal. End systolic cavity size is normal.   Prior study available for comparison from 06/10/2015. There are changes compared to prior study. The left ventricular ejection fraction has increased.   ECG rhythm shows normal sinus rhythm.  Normal perfusion. LVEF 64% with normal wall motion. This is a low risk study. No significant change compared to prior study in 2016.   ECHOCARDIOGRAM  ECHOCARDIOGRAM COMPLETE 06/09/2024  Narrative ECHOCARDIOGRAM REPORT    Patient Name:   Edwin Gallagher    Date of Exam: 06/09/2024 Medical Rec #:  [id number]     Height:       72.0 in Accession #:    [id number]    Weight:       256.2 lb Date of Birth:  May 02, 1956     BSA:          2.367 m Patient Age:    68 years      BP:           132/71 mmHg Patient Gender: M             HR:           73 bpm. Exam Location:  [address]  Procedure: 2D Echo, 3D Echo, Cardiac Doppler and Color Doppler (Both Spectral and Color Flow Doppler were utilized during procedure).  Indications:    I48.0 Atrial Fibrillation  History:        Patient has prior history of Echocardiogram examinations, most  recent 07/04/2018. Signs/Symptoms:Chest Pain; Risk Factors:Hypertension, Diabetes, HLD and Sleep Apnea.  Sonographer:    Waldo Guadalajara RCS Referring Phys: [id number] [facility] J PATWARDHAN  IMPRESSIONS   1. Left ventricular ejection fraction, by estimation, is 60 to 65%. Left ventricular ejection fraction by 3D volume is 61 %. The left ventricle has normal function. The left ventricle has no regional wall motion abnormalities. There is mild concentric left ventricular hypertrophy. Left ventricular diastolic parameters are consistent with Grade I diastolic dysfunction (impaired relaxation). 2. Right ventricular systolic function is normal. The right ventricular size is normal. There is normal pulmonary artery systolic pressure. 3. A small pericardial effusion is present. The pericardial effusion is circumferential. There is no evidence of  cardiac tamponade. 4. The mitral valve is normal in structure. No evidence of mitral valve regurgitation. No evidence of mitral stenosis. 5. The aortic valve is normal in structure. Aortic valve regurgitation is not visualized. No aortic stenosis is present. 6. The inferior vena cava is normal in size with greater than 50% respiratory variability, suggesting right atrial pressure of 3 mmHg.  FINDINGS Left Ventricle: Left ventricular ejection fraction, by estimation, is 60 to 65%. Left ventricular ejection fraction by 3D volume is 61 %. The left ventricle has normal function. The left ventricle has no regional wall motion abnormalities. The left ventricular internal cavity size was normal in size. There is mild concentric left ventricular hypertrophy. Left ventricular diastolic parameters are consistent with Grade I diastolic dysfunction (impaired relaxation).  Right Ventricle: The right ventricular size is normal. No increase in right ventricular wall thickness. Right ventricular systolic function is normal. There is normal pulmonary artery systolic pressure. The tricuspid regurgitant velocity is 2.42 m/s, and with an assumed right atrial pressure of 3 mmHg, the estimated right ventricular systolic pressure is 26.4 mmHg.  Left Atrium: Left atrial size was normal in size.  Right Atrium: Right atrial size was normal in size.  Pericardium: A small pericardial effusion is present. The pericardial effusion is circumferential. There is no evidence of cardiac tamponade. Presence of epicardial fat layer.  Mitral Valve: The mitral valve is normal in structure. No evidence of mitral valve regurgitation. No evidence of mitral valve stenosis.  Tricuspid Valve: The tricuspid valve is normal in structure. Tricuspid valve regurgitation is not demonstrated. No evidence of tricuspid stenosis.  Aortic Valve: The aortic valve is normal in structure. Aortic valve regurgitation is not visualized. No aortic stenosis  is present.  Pulmonic Valve: The pulmonic valve was normal in structure. Pulmonic valve regurgitation is trivial. No evidence of pulmonic stenosis.  Aorta: The aortic root is normal in size and structure.  Venous: The inferior vena cava is normal in size with greater than 50% respiratory variability, suggesting right atrial pressure of 3 mmHg.  IAS/Shunts: No atrial level shunt detected by color flow Doppler.  Additional Comments: 3D was performed not requiring image post processing on an independent workstation and was normal.   LEFT VENTRICLE PLAX 2D LVIDd:         4.20 cm         Diastology LVIDs:         2.90 cm         LV e' medial:    3.92 cm/s LV PW:         1.20 cm         LV E/e' medial:  18.2 LV IVS:        1.40 cm         LV  e' lateral:   6.20 cm/s LVOT diam:     2.00 cm         LV E/e' lateral: 11.5 LV SV:         64 LV SV Index:   27 LVOT Area:     3.14 cm        3D Volume EF LV 3D EF:    Left ventricul ar ejection fraction by 3D volume is 61 %.  3D Volume EF: 3D EF:        61 % LV EDV:       110 ml LV ESV:       42 ml LV SV:        67 ml  RIGHT VENTRICLE RV Basal diam:  3.20 cm RV S prime:     15.90 cm/s TAPSE (M-mode): 1.9 cm RVSP:           26.4 mmHg  LEFT ATRIUM             Index        RIGHT ATRIUM           Index LA diam:        4.00 cm 1.69 cm/m   RA Pressure: 3.00 mmHg LA Vol (A2C):   46.9 ml 19.81 ml/m  RA Area:     12.40 cm LA Vol (A4C):   33.4 ml 14.11 ml/m  RA Volume:   28.80 ml  12.17 ml/m LA Biplane Vol: 40.0 ml 16.90 ml/m AORTIC VALVE LVOT Vmax:   77.40 cm/s LVOT Vmean:  54.000 cm/s LVOT VTI:    0.205 m  AORTA Ao Root diam: 3.50 cm Ao Asc diam:  3.40 cm  MITRAL VALVE               TRICUSPID VALVE MV Area (PHT):             TR Peak grad:   23.4 mmHg MV Decel Time:             TR Vmax:        242.00 cm/s MV E velocity: 71.30 cm/s  Estimated RAP:  3.00 mmHg MV A velocity: 80.20 cm/s  RVSP:           26.4 mmHg MV E/A ratio:   0.89 SHUNTS Systemic VTI:  0.20 m Systemic Diam: 2.00 cm  Kardie Tobb DO Electronically signed by Dub Huntsman DO Signature Date/Time: 06/09/2024/4:53:31 PM    Final          ______________________________________________________________________________________________     Recent Labs: 07/13/2023: ALT 11; BUN 23; Creatinine, Ser 1.79; Hemoglobin 13.5; Magnesium  2.1; Platelets 175; Potassium 3.1; Sodium 139  Recent Lipid Panel    Component Value Date/Time   CHOL 113 10/28/2018 1200   TRIG 83 10/28/2018 1200   HDL 46 10/28/2018 1200   CHOLHDL 2.5 10/28/2018 1200   LDLCALC 50 10/28/2018 1200    History of Present Illness    68 year old male with the above past medical history including nonobstructive CAD, paroxysmal atrial fibrillation, hypertension, hyperlipidemia, type 2 diabetes, Parkinson's disease, OSA, and obesity.  He has a history of paroxysmal atrial fibrillation on chronic anticoagulation with Eliquis .  Coronary CTA in 2006 revealed calcium  score of 0. He had soft plaque in LAD of about 50%.  He had a normal stress echo in 2012. He also had a normal nuclear study in 2016. He was last seen in the office on 04/24/2024 and reported chest tightness with exertion.  Lexiscan  Myoview  on  06/09/2024 was negative for ischemia, EF 64%. Echocardiogram on 06/09/2024 showed EF 60 to 65%, normal LV function, no RWMA, mild concentric LVH, G1 DD, normal RV systolic function, small pericardial effusion with no evidence of cardiac tamponade, no significant valvular abnormalities.  He presents today for follow-up. Since his last visit he has done well from a cardiac standpoint.  He denies any chest pain, palpitations, dizziness, dyspnea, edema, PND, orthopnea, weight gain.  He reports that he sometimes forgets to take his evening dose of Eliquis .  He denies bleeding.  Additionally, he is concerned the Eliquis  continues to be expensive with his current insurance plan.  He is asking about  alternative options for anticoagulation.  Otherwise, he reports feeling well.  Home Medications    Current Outpatient Medications  Medication Sig Dispense Refill   amLODipine (NORVASC) 5 MG tablet Take 5 mg by mouth daily.     apixaban  (ELIQUIS ) 5 MG TABS tablet Take 1 tablet (5 mg total) by mouth 2 (two) times daily. 60 tablet 3   aspirin  EC 81 MG tablet Take 1 tablet (81 mg total) by mouth daily. Swallow whole.     diltiazem  (CARDIZEM ) 30 MG tablet TAKE 1 TABLET EVERY 4 HOURS AS NEEDED FOR AFIB HEART RATE >100 45 tablet 1   doxazosin (CARDURA) 4 MG tablet Take 4 mg by mouth daily. (Patient not taking: Reported on 04/24/2024)     gabapentin (NEURONTIN) 100 MG capsule Taking 200mg  by mouth as needed for pain     glimepiride (AMARYL) 4 MG tablet Take 4 mg by mouth daily.     hydrocortisone 2.5 % cream Apply topically.     losartan-hydrochlorothiazide (HYZAAR) 100-25 MG tablet Take 0.5 tablets by mouth daily.     metFORMIN (GLUCOPHAGE) 500 MG tablet Take 500 mg by mouth daily.     metoprolol  succinate (TOPROL -XL) 100 MG 24 hr tablet Take 1 tablet (100 mg total) by mouth daily. 30 tablet 0   nitroGLYCERIN  (NITROSTAT ) 0.4 MG SL tablet Place 1 tablet (0.4 mg total) under the tongue every 5 (five) minutes as needed. 25 tablet 3   ONETOUCH ULTRA test strip USE TO TEST BLOOD SUGAR TWICE DAILY     potassium chloride  SA (KLOR-CON  M) 20 MEQ tablet Take 1 tablet (20 mEq total) by mouth daily for 10 days. (Patient not taking: Reported on 04/24/2024) 10 tablet 0   rosuvastatin  (CRESTOR ) 20 MG tablet Take 1 tablet (20 mg total) by mouth daily. 90 tablet 3   SPIRONOLACTONE PO Take 1 tablet by mouth daily at 6 (six) AM.     traMADol (ULTRAM) 50 MG tablet Take 50 mg by mouth as needed.     No current facility-administered medications for this visit.     Review of Systems    He denies chest pain, palpitations, dyspnea, pnd, orthopnea, n, v, dizziness, syncope, edema, weight gain, or early satiety. All other  systems reviewed and are otherwise negative except as noted above.   Physical Exam    VS:  BP 122/78 (BP Location: Right Arm, Patient Position: Sitting, Cuff Size: Normal)   Pulse 74   Ht 6' (1.829 m)   Wt 257 lb (116.6 kg)   SpO2 94%   BMI 34.86 kg/m  GEN: Well nourished, well developed, in no acute distress. HEENT: normal. Neck: Supple, no JVD, carotid bruits, or masses. Cardiac: RRR, no murmurs, rubs, or gallops. No clubbing, cyanosis, edema.  Radials/DP/PT 2+ and equal bilaterally.  Respiratory:  Respirations regular and unlabored, clear to  auscultation bilaterally. GI: Soft, nontender, nondistended, BS + x 4. MS: no deformity or atrophy. Skin: warm and dry, no rash. Neuro:  Strength and sensation are intact. Psych: Normal affect.  Accessory Clinical Findings    ECG personally reviewed by me today -    - no EKG in office today.    Lab Results  Component Value Date   WBC 7.3 07/13/2023   HGB 13.5 07/13/2023   HCT 40.7 07/13/2023   MCV 91.9 07/13/2023   PLT 175 07/13/2023   Lab Results  Component Value Date   CREATININE 1.79 (H) 07/13/2023   BUN 23 07/13/2023   NA 139 07/13/2023   K 3.1 (L) 07/13/2023   CL 102 07/13/2023   CO2 27 07/13/2023   Lab Results  Component Value Date   ALT 11 07/13/2023   AST 28 07/13/2023   ALKPHOS 73 07/13/2023   BILITOT 0.4 07/13/2023   Lab Results  Component Value Date   CHOL 113 10/28/2018   HDL 46 10/28/2018   LDLCALC 50 10/28/2018   TRIG 83 10/28/2018   CHOLHDL 2.5 10/28/2018    Lab Results  Component Value Date   HGBA1C 7.0 (H) 01/18/2022    Assessment & Plan    1. Nonobstructive CAD:  Coronary CTA in 2006 revealed calcium  score of 0. He had soft plaque in LAD of about 50%.  He reported chest tightness with exertion at his office visit in 6/25. Lexiscan  Myoview  on 06/09/2024 was negative for ischemia, EF 64%. Echocardiogram on 06/09/2024 showed EF 60 to 65%, normal LV function, no RWMA, mild concentric LVH, G1 DD,  normal RV systolic function, small pericardial effusion with no evidence of cardiac tamponade, no significant valvular abnormalities.  Continue aspirin , metoprolol , amlodipine, losartan-HCTZ, diltiazem , and Crestor .  2. Paroxysmal atrial fibrillation: Maintaining sinus rhythm on exam. Denies any recent palpitations denies bleeding. He shares that he sometimes forgets to take his evening dose of Eliquis . Additionally, he states that Eliquis  has proved costly.  I will reach out to our pharmacy team to see if there are any opportunities for financial assistance. Continue diltiazem , metoprolol , Eliquis .  3. Hypertension: BP well controlled. Continue current antihypertensive regimen.   4. Hyperlipidemia: LDL was 104 in 08/2023, above goal.  He will have repeat fasting lipids, LFTs his PCP in 08/2024.  If LDL remains elevated above goal, consider escalation of statin therapy. Continue Crestor .  5. Type 2 diabetes: A1c was 6.9 in 04/2024.  Monitored and managed per PCP.  6. Parkinson's disease: Followed by neurology.  7. OSA: Mostly adherent to CPAP.  8. Disposition: Follow-up in 6 months, sooner if needed.      Damien JAYSON Braver, NP 06/11/2024, 11:00 AM

## 2024-06-12 ENCOUNTER — Ambulatory Visit: Payer: Self-pay | Admitting: Cardiology

## 2024-06-12 LAB — BASIC METABOLIC PANEL WITH GFR
BUN/Creatinine Ratio: 13 (ref 10–24)
BUN: 21 mg/dL (ref 8–27)
CO2: 21 mmol/L (ref 20–29)
Calcium: 9.6 mg/dL (ref 8.6–10.2)
Chloride: 102 mmol/L (ref 96–106)
Creatinine, Ser: 1.58 mg/dL — ABNORMAL HIGH (ref 0.76–1.27)
Glucose: 120 mg/dL — ABNORMAL HIGH (ref 70–99)
Potassium: 4.8 mmol/L (ref 3.5–5.2)
Sodium: 141 mmol/L (ref 134–144)
eGFR: 48 mL/min/1.73 — ABNORMAL LOW (ref >59)

## 2024-06-12 LAB — CBC
Hematocrit: 39.4 % (ref 37.5–51.0)
Hemoglobin: 12.9 g/dL — ABNORMAL LOW (ref 13.0–17.7)
MCH: 31.2 pg (ref 26.6–33.0)
MCHC: 32.7 g/dL (ref 31.5–35.7)
MCV: 95 fL (ref 79–97)
Platelets: 180 x10E3/uL (ref 150–450)
RBC: 4.14 x10E6/uL (ref 4.14–5.80)
RDW: 12.7 % (ref 11.6–15.4)
WBC: 5.2 x10E3/uL (ref 3.4–10.8)

## 2024-06-14 ENCOUNTER — Ambulatory Visit: Payer: Self-pay | Admitting: Nurse Practitioner

## 2024-06-16 DIAGNOSIS — G4733 Obstructive sleep apnea (adult) (pediatric): Secondary | ICD-10-CM | POA: Diagnosis not present

## 2024-06-16 NOTE — Telephone Encounter (Signed)
 Spoke with pt. Pt was notified of lab results and recommendations. Pt will continue current medications and f/u as planned.

## 2024-06-16 NOTE — Telephone Encounter (Signed)
-----   Message from Damien JAYSON Braver sent at 06/14/2024 10:59 AM EDT ----- Recent labs show stable mild anemia, decreased but stable kidney function, normal electrolytes.  Continue current medications and follow-up as planned.  Thank you-EM ----- Message ----- From: Rebecka Memos Lab Results In Sent: 06/12/2024   4:36 AM EDT To: Damien JAYSON Braver, NP

## 2024-07-10 ENCOUNTER — Encounter: Payer: Self-pay | Admitting: Podiatry

## 2024-07-10 ENCOUNTER — Ambulatory Visit (INDEPENDENT_AMBULATORY_CARE_PROVIDER_SITE_OTHER): Admitting: Podiatry

## 2024-07-10 DIAGNOSIS — E1149 Type 2 diabetes mellitus with other diabetic neurological complication: Secondary | ICD-10-CM | POA: Diagnosis not present

## 2024-07-10 DIAGNOSIS — E1140 Type 2 diabetes mellitus with diabetic neuropathy, unspecified: Secondary | ICD-10-CM | POA: Diagnosis not present

## 2024-07-10 DIAGNOSIS — M79674 Pain in right toe(s): Secondary | ICD-10-CM

## 2024-07-10 DIAGNOSIS — M79675 Pain in left toe(s): Secondary | ICD-10-CM | POA: Diagnosis not present

## 2024-07-10 DIAGNOSIS — D689 Coagulation defect, unspecified: Secondary | ICD-10-CM

## 2024-07-10 DIAGNOSIS — B351 Tinea unguium: Secondary | ICD-10-CM

## 2024-07-10 NOTE — Progress Notes (Addendum)
 This patient returns to my office for at risk foot care.  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  This patient is unable to cut nails himself since the patient cannot reach his nails.These nails are painful walking and wearing shoes.  This patient presents for at risk foot care today.  General Appearance  Alert, conversant and in no acute stress.  Vascular  Dorsalis pedis and posterior tibial  pulses are palpable  bilaterally.  Capillary return is within normal limits  bilaterally. Temperature is within normal limits  bilaterally.  Neurologic  Senn-Weinstein monofilament wire test within normal limits  bilaterally. Muscle power within normal limits bilaterally.  Nails Thick disfigured discolored nails with subungual debris  from hallux to fifth toes bilaterally. No evidence of bacterial infection or drainage bilaterally.  Orthopedic  No limitations of motion  feet .  No crepitus or effusions noted.  No bony pathology or digital deformities noted. Mild HAV  B/L.  ADV 5th digit  B/L.  Skin  normotropic skin with no porokeratosis noted bilaterally.  No signs of infections or ulcers noted.     Onychomycosis  Pain in right toes  Pain in left toes  Consent was obtained for treatment procedures.   Mechanical debridement of nails 1-5  bilaterally performed with a nail nipper.  Filed with dremel without incident.    Return office visit     3 months                 Told patient to return for periodic foot care and evaluation due to potential at risk complications.   Cordella Bold DPM tomma

## 2024-07-17 DIAGNOSIS — G4733 Obstructive sleep apnea (adult) (pediatric): Secondary | ICD-10-CM | POA: Diagnosis not present

## 2024-07-28 DIAGNOSIS — Z008 Encounter for other general examination: Secondary | ICD-10-CM | POA: Diagnosis not present

## 2024-09-07 DIAGNOSIS — R058 Other specified cough: Secondary | ICD-10-CM | POA: Diagnosis not present

## 2024-09-07 DIAGNOSIS — R490 Dysphonia: Secondary | ICD-10-CM | POA: Diagnosis not present

## 2024-09-07 DIAGNOSIS — F418 Other specified anxiety disorders: Secondary | ICD-10-CM | POA: Diagnosis not present

## 2024-09-07 DIAGNOSIS — R0989 Other specified symptoms and signs involving the circulatory and respiratory systems: Secondary | ICD-10-CM | POA: Diagnosis not present

## 2024-09-07 DIAGNOSIS — R350 Frequency of micturition: Secondary | ICD-10-CM | POA: Diagnosis not present

## 2024-09-07 DIAGNOSIS — G20A1 Parkinson's disease without dyskinesia, without mention of fluctuations: Secondary | ICD-10-CM | POA: Diagnosis not present

## 2024-09-07 DIAGNOSIS — R1312 Dysphagia, oropharyngeal phase: Secondary | ICD-10-CM | POA: Diagnosis not present

## 2024-09-07 DIAGNOSIS — G20B1 Parkinson's disease with dyskinesia, without mention of fluctuations: Secondary | ICD-10-CM | POA: Diagnosis not present

## 2024-09-15 DIAGNOSIS — E538 Deficiency of other specified B group vitamins: Secondary | ICD-10-CM | POA: Diagnosis not present

## 2024-09-16 DIAGNOSIS — G4733 Obstructive sleep apnea (adult) (pediatric): Secondary | ICD-10-CM | POA: Diagnosis not present

## 2024-09-16 DIAGNOSIS — R972 Elevated prostate specific antigen [PSA]: Secondary | ICD-10-CM | POA: Diagnosis not present

## 2024-09-16 DIAGNOSIS — I10 Essential (primary) hypertension: Secondary | ICD-10-CM | POA: Diagnosis not present

## 2024-09-16 DIAGNOSIS — N401 Enlarged prostate with lower urinary tract symptoms: Secondary | ICD-10-CM | POA: Diagnosis not present

## 2024-09-16 DIAGNOSIS — E785 Hyperlipidemia, unspecified: Secondary | ICD-10-CM | POA: Diagnosis not present

## 2024-09-22 ENCOUNTER — Encounter (HOSPITAL_COMMUNITY): Payer: Self-pay | Admitting: Internal Medicine

## 2024-09-22 ENCOUNTER — Other Ambulatory Visit (HOSPITAL_COMMUNITY): Payer: Self-pay | Admitting: *Deleted

## 2024-09-22 DIAGNOSIS — R1310 Dysphagia, unspecified: Secondary | ICD-10-CM

## 2024-09-22 DIAGNOSIS — Z0000 Encounter for general adult medical examination without abnormal findings: Secondary | ICD-10-CM | POA: Diagnosis not present

## 2024-09-22 DIAGNOSIS — L409 Psoriasis, unspecified: Secondary | ICD-10-CM | POA: Diagnosis not present

## 2024-09-22 DIAGNOSIS — E785 Hyperlipidemia, unspecified: Secondary | ICD-10-CM | POA: Diagnosis not present

## 2024-09-22 DIAGNOSIS — R351 Nocturia: Secondary | ICD-10-CM | POA: Diagnosis not present

## 2024-09-22 DIAGNOSIS — N1831 Chronic kidney disease, stage 3a: Secondary | ICD-10-CM | POA: Diagnosis not present

## 2024-09-22 DIAGNOSIS — R82998 Other abnormal findings in urine: Secondary | ICD-10-CM | POA: Diagnosis not present

## 2024-09-22 DIAGNOSIS — I480 Paroxysmal atrial fibrillation: Secondary | ICD-10-CM | POA: Diagnosis not present

## 2024-09-22 DIAGNOSIS — I10 Essential (primary) hypertension: Secondary | ICD-10-CM | POA: Diagnosis not present

## 2024-09-22 DIAGNOSIS — G20A1 Parkinson's disease without dyskinesia, without mention of fluctuations: Secondary | ICD-10-CM | POA: Diagnosis not present

## 2024-09-22 DIAGNOSIS — E1169 Type 2 diabetes mellitus with other specified complication: Secondary | ICD-10-CM | POA: Diagnosis not present

## 2024-09-22 DIAGNOSIS — Z6835 Body mass index (BMI) 35.0-35.9, adult: Secondary | ICD-10-CM | POA: Diagnosis not present

## 2024-09-22 DIAGNOSIS — L989 Disorder of the skin and subcutaneous tissue, unspecified: Secondary | ICD-10-CM | POA: Diagnosis not present

## 2024-10-02 ENCOUNTER — Ambulatory Visit: Payer: 59 | Admitting: Internal Medicine

## 2024-10-05 ENCOUNTER — Telehealth: Payer: Self-pay | Admitting: Internal Medicine

## 2024-10-05 ENCOUNTER — Ambulatory Visit: Payer: 59 | Admitting: Internal Medicine

## 2024-10-05 NOTE — Telephone Encounter (Signed)
 Noted.  Will inform Dr. Neysa.

## 2024-10-05 NOTE — Telephone Encounter (Signed)
 Copied from CRM [id number]. Topic: General - Other >> Oct 05, 2024  2:58 PM Corean SAUNDERS wrote: Reason for CRM: Patient calling to let Dr. Neysa and team know that he has not been consistently using his CPAP machine so he wont have anything to download before his appointment tomorrow but will still be coming in.

## 2024-10-05 NOTE — Progress Notes (Deleted)
 HPI male never smoker followed for OSA, REM Behavior Disorder, complicated by Parkinson's,  Obesity, DM 2, HBP, PAFib, Hyperlipidemia, NPSG 2012 AHI 85/ hr, CPAP titrated to 14 HST-09/24/17-AHI 20.9/hour, desaturation to 84%, body weight 289 pounds  --------------------------------------   10/03/23- 68 year old male never smoker followed for OSA, REM Behavior Disorder, complicated by Parkinson's (Neurology), Obesity, DM 2, HTN, CAD, PAFib/ Eliquis , Hyperlipidemia,  CPAP titration study 07/14/20- 14 cwp, Parkinson's, + RBD with screaming, raising arms during REM Neurology is managing his Parkinson's/REM behavior disorder. -Sinemet  IR 25-250, Melatonin 10 mg for RBD, CPAP auto 5-14/ [facility]      AirSense 10 AutoSet Download- compliance- 30%, AHI 0.9/hr Body weight today-256 lbs Had flu vax Parkinson's symptoms including RBD- screams and yelling in sleep- are better now on Rx from Neurology. He is waiting for in-line filter for CPAP before resuming regular use. Discussed.Pressure seems to high- will try reducing to auto 4-10. CXR 07/14/23- IMPRESSION: No active disease.  10/06/24- 68 year old male never smoker followed for OSA, REM Behavior Disorder, complicated by Parkinson's (Neurology), Obesity, DM 2, HTN, CAD, PAFib/ Eliquis , Hyperlipidemia,  CPAP titration study 07/14/20- 14 cwp, Parkinson's, + RBD with screaming, raising arms during REM Neurology is managing his Parkinson's/REM behavior disorder. -Sinemet  IR 25-250, Melatonin 10 mg for RBD, CPAP auto 5-14/ [facility]      AirSense 10 AutoSet Download- compliance- Body weight today-    ROS-see HPI   + = positive Constitutional:    weight loss, night sweats, fevers, chills, fatigue, lassitude. HEENT:    headaches, difficulty swallowing, tooth/dental problems, sore throat,       sneezing, itching, ear ache, nasal congestion, post nasal drip, snoring CV:    chest pain, orthopnea, PND, swelling in lower extremities, anasarca,                                                           dizziness, palpitations Resp:   shortness of breath with exertion or at rest.                productive cough,   non-productive cough, coughing up of blood.              change in color of mucus.  wheezing.    Skin:    rash or lesions. GI:  No-   heartburn, indigestion, abdominal pain, nausea, vomiting, diarrhea,                 change in bowel habits, loss of appetite GU: dysuria, change in color of urine, + nocturia.   flank pain. MS:   joint pain, stiffness, decreased range of motion,+ back pain. Neuro-     + HPI Psych:  change in mood or affect.  depression or anxiety.   memory loss.  OBJ- Physical Exam General- Alert, Oriented, Affect-appropriate, Distress- none acute, + Obese Skin- rash-none, lesions- none, excoriation- none Lymphadenopathy- none Head- atraumatic            Eyes- Gross vision intact, PERRLA, conjunctivae and secretions clear            Ears- Hearing, canals-normal            Nose- Clear, no-Septal dev, mucus, polyps, erosion, perforation             Throat- Mallampati III , mucosa clear ,  drainage- none, tonsils- atrophic Neck- flexible , trachea midline, no stridor , thyroid nl, carotid no bruit Chest - symmetrical excursion , unlabored           Heart/CV- RRR at this visit , no murmur , no gallop  , no rub, nl s1 s2                           - JVD- none , edema- none, stasis changes- none, varices- none           Lung- clear to P&A, wheeze- none, cough- none , dullness-none, rub- none           Chest wall-  Abd-  Br/ Gen/ Rectal- Not done, not indicated Extrem- cyanosis- none, clubbing, none, atrophy- none, strength- nl Neuro-  + resting tremor L hand, speech a little slow, +flat facies

## 2024-10-06 ENCOUNTER — Ambulatory Visit: Admitting: Internal Medicine

## 2024-10-06 ENCOUNTER — Encounter: Payer: Self-pay | Admitting: Internal Medicine

## 2024-10-07 ENCOUNTER — Encounter

## 2024-10-07 ENCOUNTER — Ambulatory Visit

## 2024-10-07 VITALS — BP 120/60 | HR 70 | Temp 97.6°F | Ht 72.0 in | Wt 257.2 lb

## 2024-10-07 DIAGNOSIS — G4752 REM sleep behavior disorder: Secondary | ICD-10-CM | POA: Diagnosis not present

## 2024-10-07 DIAGNOSIS — J301 Allergic rhinitis due to pollen: Secondary | ICD-10-CM | POA: Diagnosis not present

## 2024-10-07 DIAGNOSIS — G4733 Obstructive sleep apnea (adult) (pediatric): Secondary | ICD-10-CM | POA: Diagnosis not present

## 2024-10-07 MED ORDER — MELATONIN 10 MG PO TABS: 10.0000 mg | ORAL_TABLET | Freq: Every evening | ORAL | 3 refills | Status: AC

## 2024-10-07 MED ORDER — SALINE SPRAY 0.65 % NA SOLN: 1.0000 | NASAL | 5 refills | Status: AC | PRN

## 2024-10-07 MED ORDER — FLUTICASONE PROPIONATE 50 MCG/ACT NA SUSP: 1.0000 | Freq: Every day | NASAL | 2 refills | Status: AC

## 2024-10-07 MED ORDER — IPRATROPIUM BROMIDE 0.03 % NA SOLN: 2.0000 | Freq: Two times a day (BID) | NASAL | 12 refills | Status: AC

## 2024-10-07 NOTE — Patient Instructions (Signed)
  VISIT SUMMARY:  Today, you came in for a follow-up visit regarding your severe sleep apnea and related issues. We discussed your difficulties with the CPAP machine, particularly during episodes of sinus issues, and your symptoms of REM behavior disorder. We also reviewed your history of allergic rhinitis and how it affects your CPAP use.  YOUR PLAN:  -OBSTRUCTIVE SLEEP APNEA: Obstructive sleep apnea is a condition where your airway becomes blocked during sleep, causing breathing pauses. To manage this, continue using your CPAP machine after cleaning it. We have requested new supplies for your CPAP through the DME company. To help with nasal dryness, use the saline spray provided. For nasal inflammation and runny nose, use Flonase  as prescribed. If needed, you can also use the ipratropium nasal spray for your runny nose.  -REM SLEEP BEHAVIOR DISORDER: REM sleep behavior disorder is a condition where you act out your dreams during sleep. To help manage this, start taking melatonin 10 mg as prescribed. If your symptoms persist, we may consider increasing the dosage or exploring alternative treatments.  -ALLERGIC RHINITIS: Allergic rhinitis is an allergic reaction that causes sneezing, runny nose, and nasal congestion. To manage this, use Flonase  for nasal inflammation and runny nose 2 spray in each nostril x 7 days and then use 1 spray only, and the ipratropium nasal spray if needed for runny nose. The saline spray will help with nasal dryness and can be used as many times as you want. .  INSTRUCTIONS:  Please follow up with us  if you continue to have issues with your CPAP machine or if your symptoms do not improve. Make sure to use the prescribed medications as directed and let us  know if you experience any side effects or if your symptoms persist.                      Contains text generated by [employer].                                 Contains text  generated by [employer]bridge.

## 2024-10-07 NOTE — Addendum Note (Signed)
 Addended byBETHA JESSICA BOUCHARD S on: 10/07/2024 10:44 AM   Modules accepted: Orders

## 2024-10-07 NOTE — Progress Notes (Signed)
 Pulmonology Office Visit   Subjective:  Patient ID: Edwin Gallagher, male    DOB: 1956/06/02  MRN: [id number]  Referred by: Valentin Skates, DO  CC:  Chief Complaint  Patient presents with   Obstructive Sleep Apnea    Pt states he is no longer using CPAP machine because of congestion and runny nose. Last used CPAP in April.     HPI Edwin Gallagher is a 68 y.o. male with OSA, RBD, complicated by parkinsonism follows neurology, DM2, hypertension, CAD, paroxysmal A-fib on Eliquis  is here for follow-up.  Respective notes from provider reviewed as appropriate to gather relevant information for patient care.   Discussed the use of AI scribe software for clinical note transcription with the patient, who gave verbal consent to proceed.  History of Present Illness   Edwin Gallagher is a 68 year old male with severe sleep apnea who presents for a follow-up visit.  He has been experiencing difficulties with his CPAP machine, particularly during episodes of sinus issues. His nose runs frequently, making it difficult to breathe and tolerate the CPAP air. These episodes last two to three weeks. He has been using choroacetamol due to his blood pressure and has not used nasal sprays recently.  He has a history of severe sleep apnea, with a sleep study in 2011 indicating severe apnea. A more recent sleep study in 2018 showed only 30 minutes of sleep, and the last study in 2021 was conducted while using CPAP, so the severity of his apnea without CPAP is unclear. He has been using his current CPAP machine for over a year, with settings adjusted to 4 to 10, which he finds tolerable. He uses a nasal mask but sometimes experiences rawness in his sinuses.  He experiences symptoms of REM behavior disorder, with his wife noting that he occasionally talks or reaches out in his sleep, although he does not remember these events. He has not been taking melatonin recently, which he previously used to manage these  symptoms.  Regarding his sleep patterns, he typically goes to bed late, around 12 to 2 AM, and wakes up early between 6 and 8 AM to take his children to school. He wakes up once or twice a night without CPAP, but none to once with CPAP. He occasionally takes naps in the afternoon, particularly on weekends.  He is currently taking carbidopa -levodopa  for Parkinson's disease.         OSA history: Dx in 2012. On Cpap. Now not using CPAP.  Also, RBD w parkinsonism>on 10 melatonin. Follows neurology.   PAP download compliance data:   Encore/Airview resmed.  Pressure: 5-14 cm H2O>4-10 cm H2o. SABRA AHI 0.9/hr. Not using for last few months now.   PRIOR TESTS and IMAGING: NPSG 2012 AHI 85/ hr, CPAP titrated to 14 HST-09/24/17-AHI 20.9/hour (TIB 30 min), desaturation to 84%, body weight 289 pounds Echo July 2025: EF 60-65%, grade 1 diastolic dysfunction, normal PASP.     10/07/2024   10:00 AM  Results of the Epworth flowsheet  Sitting and reading 1  Watching TV 0  Sitting, inactive in a public place (e.g. a theatre or a meeting) 0  As a passenger in a car for an hour without a break 0  Lying down to rest in the afternoon when circumstances permit 0  Sitting and talking to someone 0  Sitting quietly after a lunch without alcohol 0  In a car, while stopped for a few minutes in traffic 0  Total score 1  Allergies: Patient has no known allergies.  Current Outpatient Medications:    amLODipine (NORVASC) 5 MG tablet, Take 5 mg by mouth daily. (Patient taking differently: Take 2.5 mg by mouth daily.), Disp: , Rfl:    apixaban  (ELIQUIS ) 5 MG TABS tablet, Take 1 tablet (5 mg total) by mouth 2 (two) times daily., Disp: 60 tablet, Rfl: 3   diltiazem  (CARDIZEM ) 30 MG tablet, TAKE 1 TABLET EVERY 4 HOURS AS NEEDED FOR AFIB HEART RATE >100, Disp: 45 tablet, Rfl: 1   fluticasone  (FLONASE ) 50 MCG/ACT nasal spray, Place 1 spray into both nostrils daily., Disp: 16 g, Rfl: 2   gabapentin (NEURONTIN)  100 MG capsule, Taking 200mg  by mouth as needed for pain, Disp: , Rfl:    glimepiride (AMARYL) 4 MG tablet, Take 4 mg by mouth daily., Disp: , Rfl:    hydrocortisone 2.5 % cream, Apply topically., Disp: , Rfl:    ipratropium (ATROVENT ) 0.03 % nasal spray, Place 2 sprays into both nostrils every 12 (twelve) hours., Disp: 30 mL, Rfl: 12   losartan-hydrochlorothiazide (HYZAAR) 100-25 MG tablet, Take 0.5 tablets by mouth daily., Disp: , Rfl:    Melatonin 10 MG TABS, Take 10 mg by mouth at bedtime., Disp: 30 tablet, Rfl: 3   metFORMIN (GLUCOPHAGE) 500 MG tablet, Take 500 mg by mouth daily., Disp: , Rfl:    metoprolol  succinate (TOPROL -XL) 100 MG 24 hr tablet, Take 1 tablet (100 mg total) by mouth daily., Disp: 30 tablet, Rfl: 0   nitroGLYCERIN  (NITROSTAT ) 0.4 MG SL tablet, Place 1 tablet (0.4 mg total) under the tongue every 5 (five) minutes as needed., Disp: 25 tablet, Rfl: 3   ONETOUCH ULTRA test strip, USE TO TEST BLOOD SUGAR TWICE DAILY, Disp: , Rfl:    rosuvastatin  (CRESTOR ) 20 MG tablet, Take 1 tablet (20 mg total) by mouth daily., Disp: 90 tablet, Rfl: 3   sodium chloride  (OCEAN) 0.65 % SOLN nasal spray, Place 1 spray into both nostrils as needed for congestion., Disp: 60 mL, Rfl: 5   SPIRONOLACTONE PO, Take 1 tablet by mouth daily at 6 (six) AM., Disp: , Rfl:    traMADol (ULTRAM) 50 MG tablet, Take 50 mg by mouth as needed., Disp: , Rfl:    aspirin  EC 81 MG tablet, Take 1 tablet (81 mg total) by mouth daily. Swallow whole. (Patient not taking: Reported on 10/07/2024), Disp: , Rfl:    doxazosin (CARDURA) 4 MG tablet, Take 4 mg by mouth daily. (Patient not taking: Reported on 10/07/2024), Disp: , Rfl:    potassium chloride  SA (KLOR-CON  M) 20 MEQ tablet, Take 1 tablet (20 mEq total) by mouth daily for 10 days. (Patient not taking: Reported on 10/07/2024), Disp: 10 tablet, Rfl: 0 Past Medical History:  Diagnosis Date   Abnormal cardiac CT angiography 2006   only abnormal with soft plaque in LAD  calcium  score is0    Abnormal LFTs    Balance problem    CAD (coronary artery disease)    a. has seen Dr. Morris in the past and had cardiac CT 2006 with calcium  score of 0. He had soft plaque in LAD of about 50%.  He had a normal stress echo in 2012. He also had a normal nuc in 2016.   Chest discomfort    Diabetes mellitus    Forgetfulness    Hyperlipidemia    Hypertension    Obesity    OSA (obstructive sleep apnea)    Paroxysmal atrial fibrillation (HCC)    Tremor  Past Surgical History:  Procedure Laterality Date   ROTATOR CUFF REPAIR  2009   Right   TRIGGER FINGER RELEASE  02/12/2012   Procedure: MINOR RELEASE TRIGGER FINGER/A-1 PULLEY;  Surgeon: Lamar LULLA Leonor Mickey., MD;  Location: [facility];  Service: Orthopedics;  Laterality: Right;  right thumb   Family History  Problem Relation Age of Onset   Hypertension Mother    Hypertension Father    Stroke Father    Sudden death Sister    Hypertension Sister    Alcohol abuse Brother    Heart attack Neg Hx    Social History   Socioeconomic History   Marital status: Married    Spouse name: Not on file   Number of children: 9   Years of education: two years of college   Highest education level: Not on file  Occupational History   Occupation: Retired     Associate Professor: [employer]  Tobacco Use   Smoking status: Never   Smokeless tobacco: Never  Vaping Use   Vaping status: Never Used  Substance and Sexual Activity   Alcohol use: No   Drug use: No   Sexual activity: Not on file  Other Topics Concern   Not on file  Social History Narrative   Lives at home with his wife.   Ambidextrous.   No daily use of caffeine, occasional soda.   Social Drivers of Corporate Investment Banker Strain: Not on file  Food Insecurity: Not on file  Transportation Needs: Not on file  Physical Activity: Not on file  Stress: Not on file  Social Connections: Not on file  Intimate Partner Violence: Not on file       Objective:   BP 120/60   Pulse 70   Temp 97.6 F (36.4 C)   Ht 6' (1.829 m) Comment: Per pt  Wt 257 lb 3.2 oz (116.7 kg)   SpO2 97% Comment: RA  BMI 34.88 kg/m  SpO2 Readings from Last 3 Encounters:  10/07/24 97%  06/11/24 94%  04/24/24 95%    Physical Exam: Physical Exam   ENT: Normal mucosa. No hypertrophy of inferior turbinates. Tonsils are normal sized. Modified Mallampati score is normal. PULMONARY: Lungs clear to auscultation bilaterally, no adventitious breath sounds. CARDIOVASCULAR: Regular rate and rhythm, S1 S2 normal, no murmurs. ABDOMEN: Abdomen soft, nontender. Bowel sounds are normal. EXTREMITIES: No peripheral edema.       Diagnostic Review:  Last metabolic panel Lab Results  Component Value Date   GLUCOSE 120 (H) 06/11/2024   NA 141 06/11/2024   K 4.8 06/11/2024   CL 102 06/11/2024   CO2 21 06/11/2024   BUN 21 06/11/2024   CREATININE 1.58 (H) 06/11/2024   EGFR 48 (L) 06/11/2024   CALCIUM  9.6 06/11/2024   PHOS 3.3 06/16/2008   PROT 7.8 07/13/2023   ALBUMIN 3.6 07/13/2023   LABGLOB 3.3 01/18/2022   AGRATIO 1.3 01/18/2022   BILITOT 0.4 07/13/2023   ALKPHOS 73 07/13/2023   AST 28 07/13/2023   ALT 11 07/13/2023   ANIONGAP 10 07/13/2023         Assessment & Plan:   Assessment & Plan OSA (obstructive sleep apnea)     RBD (REM behavioral disorder)     Allergic rhinitis due to pollen, unspecified seasonality        Assessment and Plan    Obstructive sleep apnea Severe obstructive sleep apnea with CPAP compliance issues due to nasal congestion and rhinorrhea. CPAP remains preferred due to  effectiveness. Discussed Inspire device and dental device; Inspire requires surgery and further sleep study, dental device unsuitable due to severity. Patient prefers not to be on inspire.  - Resume CPAP after cleaning the machine. - Requested new supplies through DME company. - Provided saline spray for nasal dryness. - Prescribed Flonase  for nasal inflammation  and rhinorrhea. - Prescribed ipratropium nasal spray for rhinorrhea if needed.  REM sleep behavior disorder Occasional dream enactment not well controlled without medication. Melatonin previously used but not recently. - Prescribed melatonin 10 mg. - Consider increasing melatonin dosage or alternative treatments if symptoms persist.  Allergic rhinitis Intermittent allergic rhinitis with rhinorrhea and nasal congestion affecting CPAP use. - Prescribed Flonase  for nasal inflammation and rhinorrhea. - Prescribed ipratropium nasal spray for rhinorrhea if needed. - Provided saline spray for nasal dryness.         Notes from Nov 2024 Dr Neysa reviewed as to gather relevant information for patient care and formulating plan.  He/She was counselled about not driving while drowsy which is common side effect of sleep related disorders.   Return in about 3 months (around 01/07/2025).   I personally spent a total of 30 minutes in the care of the patient today including preparing to see the patient, getting/reviewing separately obtained history, performing a medically appropriate exam/evaluation, counseling and educating, placing orders, documenting clinical information in the EHR, independently interpreting results, and communicating results.   Beautiful Pensyl, MD

## 2024-10-07 NOTE — Assessment & Plan Note (Signed)
 SABRA

## 2024-10-14 ENCOUNTER — Ambulatory Visit: Admitting: Podiatry

## 2024-10-14 DIAGNOSIS — B351 Tinea unguium: Secondary | ICD-10-CM

## 2024-10-14 DIAGNOSIS — D689 Coagulation defect, unspecified: Secondary | ICD-10-CM

## 2024-10-14 DIAGNOSIS — E1140 Type 2 diabetes mellitus with diabetic neuropathy, unspecified: Secondary | ICD-10-CM

## 2024-10-14 DIAGNOSIS — M79674 Pain in right toe(s): Secondary | ICD-10-CM

## 2024-10-14 DIAGNOSIS — E1149 Type 2 diabetes mellitus with other diabetic neurological complication: Secondary | ICD-10-CM

## 2024-10-14 DIAGNOSIS — M79675 Pain in left toe(s): Secondary | ICD-10-CM

## 2024-10-14 NOTE — Progress Notes (Signed)
 This patient returns to my office for at risk foot care.  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  This patient is unable to cut nails himself since the patient cannot reach his nails.These nails are painful walking and wearing shoes.  This patient presents for at risk foot care today.  General Appearance  Alert, conversant and in no acute stress.  Vascular  Dorsalis pedis and posterior tibial  pulses are palpable  bilaterally.  Capillary return is within normal limits  bilaterally. Temperature is within normal limits  bilaterally.  Neurologic  Senn-Weinstein monofilament wire test within normal limits  bilaterally. Muscle power within normal limits bilaterally.  Nails Thick disfigured discolored nails with subungual debris  from hallux to fifth toes bilaterally. No evidence of bacterial infection or drainage bilaterally.  Orthopedic  No limitations of motion  feet .  No crepitus or effusions noted.  No bony pathology or digital deformities noted. Mild HAV  B/L.  ADV 5th digit  B/L.  Skin  normotropic skin with no porokeratosis noted bilaterally.  No signs of infections or ulcers noted.     Onychomycosis  Pain in right toes  Pain in left toes  Consent was obtained for treatment procedures.   Mechanical debridement of nails 1-5  bilaterally performed with a nail nipper.  Filed with dremel without incident.    Return office visit     3 months                 Told patient to return for periodic foot care and evaluation due to potential at risk complications.   Cordella Bold DPM tomma

## 2024-10-15 ENCOUNTER — Encounter: Payer: Self-pay | Admitting: Physical Therapy

## 2024-10-15 ENCOUNTER — Ambulatory Visit: Attending: Internal Medicine | Admitting: Physical Therapy

## 2024-10-15 DIAGNOSIS — R278 Other lack of coordination: Secondary | ICD-10-CM | POA: Insufficient documentation

## 2024-10-15 DIAGNOSIS — M6281 Muscle weakness (generalized): Secondary | ICD-10-CM | POA: Insufficient documentation

## 2024-10-15 DIAGNOSIS — M25611 Stiffness of right shoulder, not elsewhere classified: Secondary | ICD-10-CM | POA: Diagnosis present

## 2024-10-15 DIAGNOSIS — M25622 Stiffness of left elbow, not elsewhere classified: Secondary | ICD-10-CM | POA: Diagnosis present

## 2024-10-15 DIAGNOSIS — M25621 Stiffness of right elbow, not elsewhere classified: Secondary | ICD-10-CM | POA: Insufficient documentation

## 2024-10-15 DIAGNOSIS — R41841 Cognitive communication deficit: Secondary | ICD-10-CM | POA: Insufficient documentation

## 2024-10-15 DIAGNOSIS — R262 Difficulty in walking, not elsewhere classified: Secondary | ICD-10-CM | POA: Insufficient documentation

## 2024-10-15 DIAGNOSIS — R29818 Other symptoms and signs involving the nervous system: Secondary | ICD-10-CM | POA: Insufficient documentation

## 2024-10-15 DIAGNOSIS — R471 Dysarthria and anarthria: Secondary | ICD-10-CM | POA: Diagnosis present

## 2024-10-15 DIAGNOSIS — M25612 Stiffness of left shoulder, not elsewhere classified: Secondary | ICD-10-CM | POA: Diagnosis present

## 2024-10-15 DIAGNOSIS — R2681 Unsteadiness on feet: Secondary | ICD-10-CM | POA: Insufficient documentation

## 2024-10-15 DIAGNOSIS — R1310 Dysphagia, unspecified: Secondary | ICD-10-CM | POA: Insufficient documentation

## 2024-10-15 NOTE — Therapy (Signed)
 OUTPATIENT PHYSICAL THERAPY NEURO EVALUATION   Patient Name: Edwin Gallagher MRN: [id number] DOB:1956/02/15, 68 y.o., male Today's Date: 10/15/2024   PCP: Valentin HAS REFERRING PROVIDER: Valentin, DO  END OF SESSION:  PT End of Session - 10/15/24 1103     Visit Number 1    Date for Recertification  01/14/24    Authorization Type aetna medicare    PT Start Time 1100    PT Stop Time 1145    PT Time Calculation (min) 45 min    Activity Tolerance Patient tolerated treatment well    Behavior During Therapy [facility] for tasks assessed/performed          Past Medical History:  Diagnosis Date   Abnormal cardiac CT angiography 2006   only abnormal with soft plaque in LAD calcium  score is0    Abnormal LFTs    Balance problem    CAD (coronary artery disease)    a. has seen Dr. Morris in the past and had cardiac CT 2006 with calcium  score of 0. He had soft plaque in LAD of about 50%.  He had a normal stress echo in 2012. He also had a normal nuc in 2016.   Chest discomfort    Diabetes mellitus    Forgetfulness    Hyperlipidemia    Hypertension    Obesity    OSA (obstructive sleep apnea)    Paroxysmal atrial fibrillation (HCC)    Tremor    Past Surgical History:  Procedure Laterality Date   ROTATOR CUFF REPAIR  2009   Right   TRIGGER FINGER RELEASE  02/12/2012   Procedure: MINOR RELEASE TRIGGER FINGER/A-1 PULLEY;  Surgeon: Lamar LULLA Leonor Mickey., MD;  Location: [facility];  Service: Orthopedics;  Laterality: Right;  right thumb   Patient Active Problem List   Diagnosis Date Noted   Parkinsonism (HCC) 01/18/2022   Parkinson's disease (HCC) 02/21/2021   Mild cognitive impairment 02/21/2021   Low back pain 02/21/2021   Bilateral low back pain without sciatica 01/25/2021   Tremor 01/05/2021   Gait abnormality 11/03/2020   Left-sided weakness 11/03/2020   Chronic left shoulder pain 11/03/2020   REM behavioral disorder 08/24/2020   Coagulation defect 07/06/2020    Paroxysmal atrial fibrillation (HCC) 01/06/2019   OSA (obstructive sleep apnea) 09/24/2011   Chest pain 05/11/2011   Hypertension 05/11/2011   Obesity 05/11/2011   Hyperlipidemia 05/11/2011    ONSET DATE: 09/14/24  REFERRING DIAG: Parkinson's, difficulty walking  THERAPY DIAG:  Difficulty in walking, not elsewhere classified  Unsteady gait  Muscle weakness (generalized)  Rationale for Evaluation and Treatment: Rehabilitation  SUBJECTIVE:  SUBJECTIVE STATEMENT: Patient denies fall recently has had in the past, reports at times he feels unsteady and off balance, fatigues easily and when fatigued he is more off balance Pt accompanied by: self  PERTINENT HISTORY: CAD, DM, HTN, A fib  PAIN:  Are you having pain? No pain now but reports occasional back pain  PRECAUTIONS: None  RED FLAGS: None   WEIGHT BEARING RESTRICTIONS: No  FALLS: Has patient fallen in last 6 months? No  LIVING ENVIRONMENT: Lives with: lives with their family Lives in: House/apartment Stairs: Yes: Internal: 16 steps; bilateral but cannot reach both Has following equipment at home: Single point cane  PLOF: Independent and does some house and yard work, member of [employer] goes less than one time a week  PATIENT GOALS: have better balance maybe do more at [employer]  OBJECTIVE:  Note: Objective measures were completed at Evaluation unless otherwise noted.  DIAGNOSTIC FINDINGS: none  COGNITION: Overall cognitive status: Within functional limits for tasks assessed   SENSATION: WFL  MUSCLE LENGTH: Very tight HS 45 degree SLR   POSTURE: rounded shoulders and forward head  LOWER EXTREMITY ROM:     Active  Right Eval Left Eval  Hip flexion 70 70  Hip extension    Hip abduction 10 10  Hip adduction    Hip  internal rotation    Hip external rotation    Knee flexion    Knee extension 10 10  Ankle dorsiflexion    Ankle plantarflexion    Ankle inversion    Ankle eversion     (Blank rows = not tested)  LOWER EXTREMITY MMT:    MMT Right Eval Left Eval  Hip flexion 4 4  Hip extension 4- 4-  Hip abduction 4- 4-  Hip adduction    Hip internal rotation    Hip external rotation    Knee flexion 4- 4-  Knee extension 4- 4-  Ankle dorsiflexion    Ankle plantarflexion    Ankle inversion    Ankle eversion    (Blank rows = not tested)  STAIRS: Uses hand rail, can do step over step but is slow, caught toe GAIT: Findings: no device, minor limp on the right, has significant pronation at the feet L>R, knees slightly flexed, has a tremor in the left hand, shuffles feet  FUNCTIONAL TESTS:  5 times sit to stand: 27 seconds Timed up and go (TUG): 18 seconds BERG 43/56                                                                                                                             TREATMENT DATE:  10/15/24 Evaluation and HEP gave information regarding inserts to help support foot   PATIENT EDUCATION: Education details: POC/HEP Person educated: Patient Education method: Programmer, Multimedia, Facilities Manager, Verbal cues, and Handouts Education comprehension: verbalized understanding  HOME EXERCISE PROGRAM: Access Code: [email or web address] URL: [email or web address] Date: 10/15/2024 Prepared by: Ozell Mainland  Exercises - Single Leg  Stance with Support  - 2 x daily - 7 x weekly - 1 sets - 10 reps - 10 hold - Standing Tandem Balance with Counter Support  - 2 x daily - 7 x weekly - 1 sets - 10 reps - 10 hold - Single Leg Balance with Clock Reach  - 2 x daily - 7 x weekly - 1 sets - 10 reps - 3 hold - Romberg Stance with Head Nods  - 2 x daily - 7 x weekly - 1 sets - 10 reps - 10 hold  GOALS: Goals reviewed with patient? Yes  SHORT TERM GOALS: Target date: 11/08/24  Independent  with initial HEP Baseline: Goal status: INITIAL  LONG TERM GOALS: Target date: 01/14/24  Independent with advanced HEP/gym Baseline:  Goal status: INITIAL  2.  Decrease TUG time to 12 seconds Baseline: 18 seconds Goal status: INITIAL  3.  Improve berg balance score to 48/56 Baseline: 43/56 Goal status: INITIAL  4.  Be able to stand SLS x 10 seconds on both feet Baseline:  Goal status: INITIAL  5.  Improve LE strength to 4+/5 Baseline:  Goal status: INITIAL  ASSESSMENT:  CLINICAL IMPRESSION: Patient is a 68 y.o. male who was seen today for physical therapy evaluation and treatment for parkinson's, weakness, and balance issues.  His TUG and BERG scores put him at an increased risk for falls.   He shuffles his feet and does not pick them up, caught toe when going up stairs  OBJECTIVE IMPAIRMENTS: Abnormal gait, cardiopulmonary status limiting activity, decreased activity tolerance, decreased balance, decreased coordination, decreased endurance, decreased mobility, difficulty walking, decreased ROM, decreased strength, increased muscle spasms, impaired flexibility, improper body mechanics, postural dysfunction, and pain.   REHAB POTENTIAL: Good  CLINICAL DECISION MAKING: Stable/uncomplicated  EVALUATION COMPLEXITY: Low  PLAN:  PT FREQUENCY: 1x/week  PT DURATION: 12 weeks  PLANNED INTERVENTIONS: 97164- PT Re-evaluation, 97110-Therapeutic exercises, 97530- Therapeutic activity, 97112- Neuromuscular re-education, 97535- Self Care, [id number]- Manual therapy, [phone]- Gait training, Patient/Family education, Balance training, Stair training, and Taping  PLAN FOR NEXT SESSION: work on balance, strength and gait   Wolfe Camarena W, PT 10/15/2024, 11:04 AM

## 2024-10-16 DIAGNOSIS — G4733 Obstructive sleep apnea (adult) (pediatric): Secondary | ICD-10-CM | POA: Diagnosis not present

## 2024-10-19 ENCOUNTER — Ambulatory Visit

## 2024-10-19 ENCOUNTER — Other Ambulatory Visit: Payer: Self-pay

## 2024-10-19 DIAGNOSIS — R262 Difficulty in walking, not elsewhere classified: Secondary | ICD-10-CM

## 2024-10-19 DIAGNOSIS — R2681 Unsteadiness on feet: Secondary | ICD-10-CM

## 2024-10-19 DIAGNOSIS — M6281 Muscle weakness (generalized): Secondary | ICD-10-CM

## 2024-10-19 NOTE — Therapy (Signed)
 OUTPATIENT PHYSICAL THERAPY NEURO TREATMENT   Patient Name: Edwin Gallagher MRN: [id number] DOB:1956/02/10, 68 y.o., male Today's Date: 10/19/2024   PCP: Valentin HAS REFERRING PROVIDER: Valentin, DO  END OF SESSION:  PT End of Session - 10/19/24 0908     Visit Number 2    Date for Recertification  01/14/24    Authorization Type aetna medicare    PT Start Time [phone]    PT Stop Time 0938    PT Time Calculation (min) 48 min    Activity Tolerance Patient tolerated treatment well    Behavior During Therapy [facility] for tasks assessed/performed           Past Medical History:  Diagnosis Date   Abnormal cardiac CT angiography 2006   only abnormal with soft plaque in LAD calcium  score is0    Abnormal LFTs    Balance problem    CAD (coronary artery disease)    a. has seen Dr. Morris in the past and had cardiac CT 2006 with calcium  score of 0. He had soft plaque in LAD of about 50%.  He had a normal stress echo in 2012. He also had a normal nuc in 2016.   Chest discomfort    Diabetes mellitus    Forgetfulness    Hyperlipidemia    Hypertension    Obesity    OSA (obstructive sleep apnea)    Paroxysmal atrial fibrillation (HCC)    Tremor    Past Surgical History:  Procedure Laterality Date   ROTATOR CUFF REPAIR  2009   Right   TRIGGER FINGER RELEASE  02/12/2012   Procedure: MINOR RELEASE TRIGGER FINGER/A-1 PULLEY;  Surgeon: Lamar LULLA Leonor Mickey., MD;  Location: [facility];  Service: Orthopedics;  Laterality: Right;  right thumb   Patient Active Problem List   Diagnosis Date Noted   Parkinsonism (HCC) 01/18/2022   Parkinson's disease (HCC) 02/21/2021   Mild cognitive impairment 02/21/2021   Low back pain 02/21/2021   Bilateral low back pain without sciatica 01/25/2021   Tremor 01/05/2021   Gait abnormality 11/03/2020   Left-sided weakness 11/03/2020   Chronic left shoulder pain 11/03/2020   REM behavioral disorder 08/24/2020   Coagulation defect 07/06/2020    Paroxysmal atrial fibrillation (HCC) 01/06/2019   OSA (obstructive sleep apnea) 09/24/2011   Chest pain 05/11/2011   Hypertension 05/11/2011   Obesity 05/11/2011   Hyperlipidemia 05/11/2011    ONSET DATE: 09/14/24  REFERRING DIAG: Parkinson's, difficulty walking  THERAPY DIAG:  Unsteady gait  Difficulty in walking, not elsewhere classified  Muscle weakness (generalized)  Rationale for Evaluation and Treatment: Rehabilitation  SUBJECTIVE:  SUBJECTIVE STATEMENT:no new complaints, has trialed home ex at home without difficulty Pt accompanied by: self  PERTINENT HISTORY: CAD, DM, HTN, A fib  PAIN:  Are you having pain? No pain now but reports occasional back pain  PRECAUTIONS: None  RED FLAGS: None   WEIGHT BEARING RESTRICTIONS: No  FALLS: Has patient fallen in last 6 months? No  LIVING ENVIRONMENT: Lives with: lives with their family Lives in: House/apartment Stairs: Yes: Internal: 16 steps; bilateral but cannot reach both Has following equipment at home: Single point cane  PLOF: Independent and does some house and yard work, [employer] of [employer] goes less than one time a week  PATIENT GOALS: have better balance maybe do more at [employer]  OBJECTIVE:  Note: Objective measures were completed at Evaluation unless otherwise noted.  DIAGNOSTIC FINDINGS: none  COGNITION: Overall cognitive status: Within functional limits for tasks assessed   SENSATION: WFL  MUSCLE LENGTH: Very tight HS 45 degree SLR   POSTURE: rounded shoulders and forward head  LOWER EXTREMITY ROM:     Active  Right Eval Left Eval  Hip flexion 70 70  Hip extension    Hip abduction 10 10  Hip adduction    Hip internal rotation    Hip external rotation    Knee flexion    Knee extension 10 10  Ankle  dorsiflexion    Ankle plantarflexion    Ankle inversion    Ankle eversion     (Blank rows = not tested)  LOWER EXTREMITY MMT:    MMT Right Eval Left Eval  Hip flexion 4 4  Hip extension 4- 4-  Hip abduction 4- 4-  Hip adduction    Hip internal rotation    Hip external rotation    Knee flexion 4- 4-  Knee extension 4- 4-  Ankle dorsiflexion    Ankle plantarflexion    Ankle inversion    Ankle eversion    (Blank rows = not tested)  STAIRS: Uses hand rail, can do step over step but is slow, caught toe GAIT: Findings: no device, minor limp on the right, has significant pronation at the feet L>R, knees slightly flexed, has a tremor in the left hand, shuffles feet  FUNCTIONAL TESTS:  5 times sit to stand: 27 seconds Timed up and go (TUG): 18 seconds BERG 43/56                                                                                                                             TREATMENT DATE:  10/19/24: pt used both hands for support for all of the below activities:  In ll bars for high marches, trying to match height of bar with each leg 15x In ll bars for heel toe rocks on airex 15x In ll bars, lateral travelling over airex, 15x In ll bars for four square, one block stepping clockwise and counterclockwise 5 reps each direction  Nustep L 6 x 6 min Supine  for hamstring stretch, pt using yoga strap to assist, therapist providing manual overpressure to isolate posterior knee, 2 reps each side 45 sec each Prone for post prox femur stabilization, for manual quads/hip flexor stretch 45 sec holds, 2 x each leg  Sit to stand with red mediball punches, B feet on airex, 10 x from hi/low table  Standing marching on airex Standing on incline board for B plantarflexor stretch, 30 sec holds, 3 x Tandem standing, 25 sec each leg   10/15/24 Evaluation and HEP gave information regarding inserts to help support foot   PATIENT EDUCATION: Education details: POC/HEP Person educated:  Patient Education method: Programmer, Multimedia, Facilities Manager, Verbal cues, and Handouts Education comprehension: verbalized understanding  HOME EXERCISE PROGRAM: Access Code: [email or web address] URL: [email or web address] Date: 10/15/2024 Prepared by: Ozell Mainland  Exercises - Single Leg Stance with Support  - 2 x daily - 7 x weekly - 1 sets - 10 reps - 10 hold - Standing Tandem Balance with Counter Support  - 2 x daily - 7 x weekly - 1 sets - 10 reps - 10 hold - Single Leg Balance with Clock Reach  - 2 x daily - 7 x weekly - 1 sets - 10 reps - 3 hold - Romberg Stance with Head Nods  - 2 x daily - 7 x weekly - 1 sets - 10 reps - 10 hold  GOALS: Goals reviewed with patient? Yes  SHORT TERM GOALS: Target date: 11/08/24  Independent with initial HEP Baseline: Goal status: INITIAL  LONG TERM GOALS: Target date: 01/14/24  Independent with advanced HEP/gym Baseline:  Goal status: INITIAL  2.  Decrease TUG time to 12 seconds Baseline: 18 seconds Goal status: INITIAL  3.  Improve berg balance score to 48/56 Baseline: 43/56 Goal status: INITIAL  4.  Be able to stand SLS x 10 seconds on both feet Baseline:  Goal status: INITIAL  5.  Improve LE strength to 4+/5 Baseline:  Goal status: INITIAL  ASSESSMENT:  CLINICAL IMPRESSION: Patient is a 68 y.o. male who participated today in physical therapy treatment for parkinson's, weakness, and balance issues. Today we incorporated activities to stretch his ant hips, hamstrings, as well as standing agility/coordination activities.   Noted marked overpronation L foot, ankle with gait.  Also quite flexed posture through B hips, knees, tried to address with specific stretching.  Will benefit from physical therapy to address his impairments and for comprehensive home routine.  OBJECTIVE IMPAIRMENTS: Abnormal gait, cardiopulmonary status limiting activity, decreased activity tolerance, decreased balance, decreased coordination, decreased  endurance, decreased mobility, difficulty walking, decreased ROM, decreased strength, increased muscle spasms, impaired flexibility, improper body mechanics, postural dysfunction, and pain.   REHAB POTENTIAL: Good  CLINICAL DECISION MAKING: Stable/uncomplicated  EVALUATION COMPLEXITY: Low  PLAN:  PT FREQUENCY: 1x/week  PT DURATION: 12 weeks  PLANNED INTERVENTIONS: 97164- PT Re-evaluation, 97110-Therapeutic exercises, 97530- Therapeutic activity, 97112- Neuromuscular re-education, 97535- Self Care, [id number]- Manual therapy, [phone]- Gait training, Patient/Family education, Balance training, Stair training, and Taping  PLAN FOR NEXT SESSION: work on balance, strength and gait   Mayola Mcbain L Yoshio Seliga, PT, DPT, OCS 10/19/2024, 9:56 AM

## 2024-10-20 ENCOUNTER — Ambulatory Visit (HOSPITAL_COMMUNITY)
Admission: RE | Admit: 2024-10-20 | Discharge: 2024-10-20 | Disposition: A | Source: Ambulatory Visit | Attending: Internal Medicine

## 2024-10-20 ENCOUNTER — Ambulatory Visit (HOSPITAL_COMMUNITY): Admission: RE | Admit: 2024-10-20 | Source: Ambulatory Visit

## 2024-10-20 ENCOUNTER — Encounter (HOSPITAL_COMMUNITY): Payer: Self-pay

## 2024-11-02 ENCOUNTER — Ambulatory Visit: Admitting: Speech Pathology

## 2024-11-02 ENCOUNTER — Encounter: Payer: Self-pay | Admitting: Speech Pathology

## 2024-11-02 DIAGNOSIS — R1310 Dysphagia, unspecified: Secondary | ICD-10-CM

## 2024-11-02 DIAGNOSIS — R471 Dysarthria and anarthria: Secondary | ICD-10-CM

## 2024-11-02 DIAGNOSIS — R262 Difficulty in walking, not elsewhere classified: Secondary | ICD-10-CM | POA: Diagnosis not present

## 2024-11-02 DIAGNOSIS — R41841 Cognitive communication deficit: Secondary | ICD-10-CM

## 2024-11-02 NOTE — Therapy (Unsigned)
 " OUTPATIENT SPEECH LANGUAGE PATHOLOGY EVALUATION   Patient Name: Edwin Gallagher MRN: [id number] DOB:1956-05-26, 68 y.o., male Today's Date: 11/02/2024  PCP: Valentin Skates, DO REFERRING PROVIDER: Valentin Skates, DO  END OF SESSION:  End of Session - 11/02/24 0938     Visit Number 1    Date for Recertification  01/03/25    SLP Start Time 0930    SLP Stop Time  1015    SLP Time Calculation (min) 45 min    Activity Tolerance Patient tolerated treatment well          Past Medical History:  Diagnosis Date   Abnormal cardiac CT angiography 2006   only abnormal with soft plaque in LAD calcium  score is0    Abnormal LFTs    Balance problem    CAD (coronary artery disease)    a. has seen Dr. Morris in the past and had cardiac CT 2006 with calcium  score of 0. He had soft plaque in LAD of about 50%.  He had a normal stress echo in 2012. He also had a normal nuc in 2016.   Chest discomfort    Diabetes mellitus    Forgetfulness    Hyperlipidemia    Hypertension    Obesity    OSA (obstructive sleep apnea)    Paroxysmal atrial fibrillation (HCC)    Tremor    Past Surgical History:  Procedure Laterality Date   ROTATOR CUFF REPAIR  2009   Right   TRIGGER FINGER RELEASE  02/12/2012   Procedure: MINOR RELEASE TRIGGER FINGER/A-1 PULLEY;  Surgeon: Lamar LULLA Leonor Mickey., MD;  Location: [facility];  Service: Orthopedics;  Laterality: Right;  right thumb   Patient Active Problem List   Diagnosis Date Noted   Parkinsonism (HCC) 01/18/2022   Parkinson's disease (HCC) 02/21/2021   Mild cognitive impairment 02/21/2021   Low back pain 02/21/2021   Bilateral low back pain without sciatica 01/25/2021   Tremor 01/05/2021   Gait abnormality 11/03/2020   Left-sided weakness 11/03/2020   Chronic left shoulder pain 11/03/2020   REM behavioral disorder 08/24/2020   Coagulation defect 07/06/2020   Paroxysmal atrial fibrillation (HCC) 01/06/2019   OSA (obstructive sleep apnea)  09/24/2011   Chest pain 05/11/2011   Hypertension 05/11/2011   Obesity 05/11/2011   Hyperlipidemia 05/11/2011    ONSET DATE: Referred on  09/23/24  REFERRING DIAG: G20.A1 (ICD-10-CM) - Parkinson's disease without dyskinesia, without mention of fluctuations   THERAPY DIAG:  Cognitive communication deficit  Dysarthria and anarthria  Dysphagia, unspecified type  Rationale for Evaluation and Treatment: Rehabilitation  SUBJECTIVE:   SUBJECTIVE STATEMENT: Pt was pleasant and cooperative throughout evaluation.   Pt accompanied by: self  PERTINENT HISTORY: History of nonobstructive CAD, paroxysmal atrial fibrillation, hypertension, hyperlipidemia, type 2 diabetes, Parkinson's disease, OSA   PAIN:  Are you having pain? No  FALLS: Has patient fallen in last 6 months?  No  LIVING ENVIRONMENT: Lives with: lives with their family; [place] Lives in: House/apartment  PLOF:  Level of assistance: Independent with ADLs, Independent with IADLs Employment: Retired; [employer]   PATIENT GOALS: swallowing, voice   OBJECTIVE:  Note: Objective measures were completed at Evaluation unless otherwise noted.  DIAGNOSTIC FINDINGS: Scheduled MBS on 1/15  COGNITION: Overall cognitive status: Impaired: Areas of impairment:  Memory: Impaired: Short term Prospective  Functional deficits: Need to complete cognitive PROM; may have some decreased awareness   MOTOR SPEECH: Overall motor speech: impaired Level of impairment: Sentence and Conversation Respiration: Normal Phonation: normal Resonance:  WFL Articulation: Appears intact Intelligibility: Intelligibility reduced Motor planning: Appears intact Intensity: Reduced (see voice)  Effective technique: increased vocal intensity  SOCIAL HISTORY: Occupation: Retired Counsellor intake: suboptimal; 2 bottles Caffeine/alcohol intake: minimal Daily voice use: moderate  PERCEPTUAL VOICE ASSESSMENT: Voice quality: low vocal intensity Vocal abuse:  NA Resonance: normal Respiratory function: Normal  OBJECTIVE VOICE ASSESSMENT: Maximum phonation time for sustained ah: - Conversational pitch average: 109 Hz Conversational pitch range: 92-128 Hz Conversational loudness average: 70 dB Conversational loudness range: 59-80 dB S/z ratio: - (Suggestive of dysfunction >1.0) /s/:  /z/:   ORAL MOTOR EXAMINATION: Overall status: WFL Comments:   RECOMMENDATIONS FROM OBJECTIVE SWALLOW STUDY (MBSS/FEES):    **EVALUATION SCHEDULED ON 11/27/23    CLINICAL SWALLOW ASSESSMENT:   Current diet: regular and thin liquids Dentition: adequate natural dentition Patient directly observed with POs: No Feeding: able to feed self Liquids provided by: Oral phase signs and symptoms: NA Pharyngeal phase signs and symptoms: NA Comments: plans for MBS on Jan 15  STANDARDIZED ASSESSMENTS: SLUMS: 26/30  PATIENT REPORTED OUTCOME MEASURES (PROM): EAT-10: 5/40  VHI-10: 7/40                                                                                                                            TREATMENT DATE:    PATIENT EDUCATION: Education details: PD and related changes Person educated: Patient Education method: Explanation Education comprehension: needs further education   GOALS: Goals reviewed with patient? No; pt to decide if he wants to pursue tx for decreased vocal volume  SHORT TERM GOALS: Target date: 12/05/23  Pt will complete voice assessment and goals will be updated per patient's wishes.  Baseline: Goal status: INITIAL  2.  Pt will participate in MBS for further insight into swallowing deficits Baseline:  Goal status: INITIAL  3.   Baseline:  Goal status: INITIAL  4.   Baseline:  Goal status: INITIAL  5.   Baseline:  Goal status: INITIAL  6.   Baseline:  Goal status: INITIAL  LONG TERM GOALS: Target date: 01/05/24  Pt will improve score on PROM Baseline:  Goal status: INITIAL  2.  Pt will report use of  external memory strategies  Baseline:  Goal status: INITIAL  3.   Baseline:  Goal status: INITIAL  4.   Baseline:  Goal status: INITIAL  5.   Baseline:  Goal status: INITIAL  6.   Baseline:  Goal status: INITIAL  ASSESSMENT:  CLINICAL IMPRESSION: Pt is a 68 yo male who presents to ST OP for evaluation for Parkinson's Disease. Pt endorses re: choking with liquids/solids and pills getting stuck and changes in thinking skills (memory). He reports he has always been soft spoken. Pt is responsible for medication management, wife helps with financial management. Pt was assessed using PROMs, SLUMS, and voice measures. SLP observed decreased vocal volume during today's assessment. SLP rec skilled ST services to address cognitive-communication, dysarthria and dysphagia.    OBJECTIVE IMPAIRMENTS: include  memory, dysarthria, and dysphagia. These impairments are limiting patient from managing medications, managing appointments, managing finances, household responsibilities, effectively communicating at home and in community, and safety when swallowing. Factors affecting potential to achieve goals and functional outcome are NA. Patient will benefit from skilled SLP services to address above impairments and improve overall function.  REHAB POTENTIAL: Good  PLAN:  SLP FREQUENCY: 1x/week  SLP DURATION: 8 weeks  PLANNED INTERVENTIONS: Aspiration precaution training, Pharyngeal strengthening exercises, Diet toleration management , Environmental controls, Cueing hierachy, SLP instruction and feedback, Compensatory strategies, Patient/family education, [phone] Treatment of speech (30 or 45 min) , and [id number] Treatment of swallowing function    [employer], CCC-SLP 11/02/2024, 9:40 AM           "

## 2024-11-04 ENCOUNTER — Ambulatory Visit

## 2024-11-09 ENCOUNTER — Encounter: Payer: Self-pay | Admitting: Physical Therapy

## 2024-11-09 ENCOUNTER — Ambulatory Visit: Admitting: Occupational Therapy

## 2024-11-09 ENCOUNTER — Ambulatory Visit: Admitting: Physical Therapy

## 2024-11-09 ENCOUNTER — Other Ambulatory Visit: Payer: Self-pay

## 2024-11-09 DIAGNOSIS — R262 Difficulty in walking, not elsewhere classified: Secondary | ICD-10-CM | POA: Diagnosis not present

## 2024-11-09 DIAGNOSIS — M25622 Stiffness of left elbow, not elsewhere classified: Secondary | ICD-10-CM

## 2024-11-09 DIAGNOSIS — R2681 Unsteadiness on feet: Secondary | ICD-10-CM

## 2024-11-09 DIAGNOSIS — R278 Other lack of coordination: Secondary | ICD-10-CM

## 2024-11-09 DIAGNOSIS — M25612 Stiffness of left shoulder, not elsewhere classified: Secondary | ICD-10-CM

## 2024-11-09 DIAGNOSIS — M6281 Muscle weakness (generalized): Secondary | ICD-10-CM

## 2024-11-09 DIAGNOSIS — M25621 Stiffness of right elbow, not elsewhere classified: Secondary | ICD-10-CM

## 2024-11-09 DIAGNOSIS — M25611 Stiffness of right shoulder, not elsewhere classified: Secondary | ICD-10-CM

## 2024-11-09 DIAGNOSIS — R29818 Other symptoms and signs involving the nervous system: Secondary | ICD-10-CM

## 2024-11-09 NOTE — Therapy (Signed)
 "  OUTPATIENT OCCUPATIONAL THERAPY PARKINSON'S EVALUATION  Patient Name: Edwin Gallagher MRN: [id number] DOB:1956/04/03, 68 y.o., male Today's Date: 11/09/2024  PCP: Valentin Skates DO REFERRING PROVIDER: Valentin Skates DO  END OF SESSION:  OT End of Session - 11/09/24 1330     Visit Number 1    Number of Visits 12    Date for Recertification  02/01/25    Authorization Type [employer]    Authorization - Visit Number 1    Progress Note Due on Visit 10    OT Start Time 1233    OT Stop Time 1313    OT Time Calculation (min) 40 min    Behavior During Therapy [facility] for tasks assessed/performed          Past Medical History:  Diagnosis Date   Abnormal cardiac CT angiography 2006   only abnormal with soft plaque in LAD calcium  score is0    Abnormal LFTs    Balance problem    CAD (coronary artery disease)    a. has seen Dr. Morris in the past and had cardiac CT 2006 with calcium  score of 0. He had soft plaque in LAD of about 50%.  He had a normal stress echo in 2012. He also had a normal nuc in 2016.   Chest discomfort    Diabetes mellitus    Forgetfulness    Hyperlipidemia    Hypertension    Obesity    OSA (obstructive sleep apnea)    Paroxysmal atrial fibrillation (HCC)    Tremor    Past Surgical History:  Procedure Laterality Date   ROTATOR CUFF REPAIR  2009   Right   TRIGGER FINGER RELEASE  02/12/2012   Procedure: MINOR RELEASE TRIGGER FINGER/A-1 PULLEY;  Surgeon: Lamar LULLA Leonor Mickey., MD;  Location: [facility];  Service: Orthopedics;  Laterality: Right;  right thumb   Patient Active Problem List   Diagnosis Date Noted   Parkinsonism (HCC) 01/18/2022   Parkinson's disease (HCC) 02/21/2021   Mild cognitive impairment 02/21/2021   Low back pain 02/21/2021   Bilateral low back pain without sciatica 01/25/2021   Tremor 01/05/2021   Gait abnormality 11/03/2020   Left-sided weakness 11/03/2020   Chronic left shoulder pain 11/03/2020   REM behavioral disorder  08/24/2020   Coagulation defect 07/06/2020   Paroxysmal atrial fibrillation (HCC) 01/06/2019   OSA (obstructive sleep apnea) 09/24/2011   Chest pain 05/11/2011   Hypertension 05/11/2011   Obesity 05/11/2011   Hyperlipidemia 05/11/2011    ONSET DATE: 09/23/24  REFERRING DIAG: Parkinson's disease  THERAPY DIAG:  Other symptoms and signs involving the nervous system - Plan: Ot plan of care cert/re-cert  Other lack of coordination - Plan: Ot plan of care cert/re-cert  Stiffness of left shoulder, not elsewhere classified - Plan: Ot plan of care cert/re-cert  Stiffness of right shoulder, not elsewhere classified - Plan: Ot plan of care cert/re-cert  Stiffness of left elbow, not elsewhere classified - Plan: Ot plan of care cert/re-cert  Stiffness of right elbow, not elsewhere classified - Plan: Ot plan of care cert/re-cert  Rationale for Evaluation and Treatment: Rehabilitation  SUBJECTIVE:   SUBJECTIVE STATEMENT: Pt reports no falls Pt accompanied by: self  PERTINENT HISTORY: Parkinson's sdisease diagnosed 3 years ago per pt.History of nonobstructive CAD, paroxysmal atrial fibrillation, hypertension, hyperlipidemia, type 2 diabetes,  OSA  PRECAUTIONS: Fall, swallowing issues, has MBS scheduled  WEIGHT BEARING RESTRICTIONS: No  PAIN:  Are you having pain? No  FALLS: Has patient fallen in  last 6 months? No  LIVING ENVIRONMENT: Lives with: lives with their family Lives in: House/apartment Stairs: internal   PLOF: Independent  PATIENT GOALS: maintain independence   OBJECTIVE:  Note: Objective measures were completed at Evaluation unless otherwise noted.  HAND DOMINANCE: Right  ADLs: Overall ADLs: increased time Transfers/ambulation related to ADLs:mod I Eating: mod I, swallowing issues Grooming: mod I UB Dressing: mod I LB Dressing: difficulty with shoes and socks Toileting: mod I  Bathing: mod I Tub Shower transfers:mod I- walk in  shower  IADLs: Shopping: mod I Light housekeeping: light cleaning only Meal Prep: minimal cooking only Community mobility: mod I Medication management: pt handles own meds Financial management: pt/ wife handle together Handwriting: 100% legible  MOBILITY STATUS: Independent   FUNCTIONAL OUTCOME MEASURES: Fastening/unfastening 3 buttons: 33.50 secs Physical performance test: PPT#2 (simulated eating) NT & PPT#4 (donning/doffing jacket): 17.13  COORDINATION: 9 Hole Peg test: Right: 28.39 sec; Left: 32.70 sec Box and Blocks:  Right 47blocks, Left 45 blocks Tremors: Resting for LUE  UE ROM:  Shoulder flexion RUE 110-hx of RC repair, LUE 115, elbow extension: RUE -30, LUE -30, RUE decreased wrist ext 40    SENSATION: Not tested  MUSCLE TONE: LUE: Mild and Rigidity  COGNITION: Overall cognitive status: not formally tested, will further assess in a functional context                                                                                                                     TREATMENT DATE: 11/09/24    PATIENT EDUCATION: Education details: role of OT, potential goals Person educated: Patient Education method: Explanation Education comprehension: verbalized understanding  HOME EXERCISE PROGRAM: n/a  GOALS: Goals reviewed with patient? Yes  SHORT TERM GOALS: Target date: 12/10/24  I with PD specific HEP Baseline: Goal status: INITIAL  2.  I with adapted strategies to maximize pt's safety and I with ADLs/I ADLs Baseline:  Goal status: INITIAL  3.  Pt will demontrate improved ease with dressing as eviednced by decreasing PPT# 4 to 15 secs or less. Baseline:  Goal status: INITIAL  4.  Pt will demonstrate improved bilateral UE functional use as evidenced by decreasing 3 button/ unbutton by 3 secs. Baseline:  Goal status: INITIAL   LONG TERM GOALS: Target date: 01/04/25 Pt will demonstrate improved bilateral functional use as evidenced by decreasing 3  button/ unbutton score by 3 secs. Baseline:  Goal status: INITIAL  2.  Pt will demonstrate improved bilateral UE functional use as evidenced by increasing bilateral box/ blocks score by 3 blocks Baseline:  Goal status: INITIAL  3.  Pt will increase functional reach with LUE to 120 shoulder flexion with -25 elbow extension for increased functional use. Baseline:  Goal status: INITIAL  4.  Pt. will demonstrate ability to retrieve a lightweight object at 115 shoulder lexion and -25 elbow extnesion with RUE Baseline:  Goal status: INITIAL  5.Pt will verbalize understanding of ways to prevent future PD related complications and PD community resources.  Baseline:  Goal status: INITIAL  ASSESSMENT:  CLINICAL IMPRESSION: Patient is a 68 y.o. male who was seen today for occupational therapy evaluation for Parkinson's disease. Pt presents with the performance deficits below. He can benefit from skilled occupational therapy to address these deficits in order to maximize pt's safety and I with ADLs/ IADLs.  PERFORMANCE DEFICITS: in functional skills including ADLs, IADLs, coordination, dexterity, tone, ROM, strength, flexibility, Fine motor control, Gross motor control, mobility, balance, endurance, decreased knowledge of precautions, decreased knowledge of use of DME, and UE functional use, mild cognitive deficits and psychosocial skills including coping strategies, environmental adaptation, habits, interpersonal interactions, and routines and behaviors.   IMPAIRMENTS: are limiting patient from ADLs, IADLs, play, leisure, and social participation.   COMORBIDITIES:  may have co-morbidities  that affects occupational performance. Patient will benefit from skilled OT to address above impairments and improve overall function.  MODIFICATION OR ASSISTANCE TO COMPLETE EVALUATION: No modification of tasks or assist necessary to complete an evaluation.  OT OCCUPATIONAL PROFILE AND HISTORY: Detailed  assessment: Review of records and additional review of physical, cognitive, psychosocial history related to current functional performance.  CLINICAL DECISION MAKING: LOW - limited treatment options, no task modification necessary  REHAB POTENTIAL: Good  EVALUATION COMPLEXITY: Low    PLAN:  OT FREQUENCY: 1-2x/week  OT DURATION: 12 weeks  PLANNED INTERVENTIONS: 97168 OT Re-evaluation, 97535 self care/ADL training, 02889 therapeutic exercise, 97530 therapeutic activity, 97112 neuromuscular re-education, 97140 manual therapy, 97035 ultrasound, 97018 paraffin, 02989 moist heat, 97129 Cognitive training (first 15 min), 02869 Cognitive training(each additional 15 min), passive range of motion, balance training, functional mobility training, energy conservation, coping strategies training, patient/family education, and DME and/or AE instructions  RECOMMENDED OTHER SERVICES: n/a  CONSULTED AND AGREED WITH PLAN OF CARE: Patient  PLAN FOR NEXT SESSION: inital HEP   Ishaan Villamar, OT 11/09/2024, 5:15 PM   "

## 2024-11-09 NOTE — Therapy (Signed)
 " OUTPATIENT PHYSICAL THERAPY NEURO TREATMENT   Patient Name: Rynell Ciotti MRN: [id number] DOB:May 01, 1956, 68 y.o., male Today's Date: 11/09/2024   PCP: Valentin HAS REFERRING PROVIDER: Valentin, DO  END OF SESSION:  PT End of Session - 11/09/24 1316     Visit Number 3    Date for Recertification  01/14/24    Authorization Type [employer] medicare    PT Start Time 1315    PT Stop Time 1357    PT Time Calculation (min) 42 min    Activity Tolerance Patient tolerated treatment well    Behavior During Therapy [facility] for tasks assessed/performed            Past Medical History:  Diagnosis Date   Abnormal cardiac CT angiography 2006   only abnormal with soft plaque in LAD calcium  score is0    Abnormal LFTs    Balance problem    CAD (coronary artery disease)    a. has seen Dr. Morris in the past and had cardiac CT 2006 with calcium  score of 0. He had soft plaque in LAD of about 50%.  He had a normal stress echo in 2012. He also had a normal nuc in 2016.   Chest discomfort    Diabetes mellitus    Forgetfulness    Hyperlipidemia    Hypertension    Obesity    OSA (obstructive sleep apnea)    Paroxysmal atrial fibrillation (HCC)    Tremor    Past Surgical History:  Procedure Laterality Date   ROTATOR CUFF REPAIR  2009   Right   TRIGGER FINGER RELEASE  02/12/2012   Procedure: MINOR RELEASE TRIGGER FINGER/A-1 PULLEY;  Surgeon: Lamar LULLA Leonor Mickey., MD;  Location: [facility];  Service: Orthopedics;  Laterality: Right;  right thumb   Patient Active Problem List   Diagnosis Date Noted   Parkinsonism (HCC) 01/18/2022   Parkinson's disease (HCC) 02/21/2021   Mild cognitive impairment 02/21/2021   Low back pain 02/21/2021   Bilateral low back pain without sciatica 01/25/2021   Tremor 01/05/2021   Gait abnormality 11/03/2020   Left-sided weakness 11/03/2020   Chronic left shoulder pain 11/03/2020   REM behavioral disorder 08/24/2020   Coagulation defect 07/06/2020    Paroxysmal atrial fibrillation (HCC) 01/06/2019   OSA (obstructive sleep apnea) 09/24/2011   Chest pain 05/11/2011   Hypertension 05/11/2011   Obesity 05/11/2011   Hyperlipidemia 05/11/2011    ONSET DATE: 09/14/24  REFERRING DIAG: Parkinson's, difficulty walking  THERAPY DIAG:  Muscle weakness (generalized)  Unsteady gait  Difficulty in walking, not elsewhere classified  Rationale for Evaluation and Treatment: Rehabilitation  SUBJECTIVE:  SUBJECTIVE STATEMENT: Pt states that he has been doing well since his previous session, reports compliance with HEP. Denies pain.   Pt accompanied by: self  PERTINENT HISTORY: CAD, DM, HTN, A fib  PAIN:  Are you having pain? No pain now but reports occasional back pain  PRECAUTIONS: None  RED FLAGS: None   WEIGHT BEARING RESTRICTIONS: No  FALLS: Has patient fallen in last 6 months? No  LIVING ENVIRONMENT: Lives with: lives with their family Lives in: House/apartment Stairs: Yes: Internal: 16 steps; bilateral but cannot reach both Has following equipment at home: Single point cane  PLOF: Independent and does some house and yard work, [employer] of [employer] goes less than one time a week  PATIENT GOALS: have better balance maybe do more at [employer]  OBJECTIVE:  Note: Objective measures were completed at Evaluation unless otherwise noted.  DIAGNOSTIC FINDINGS: none  COGNITION: Overall cognitive status: Within functional limits for tasks assessed   SENSATION: WFL  MUSCLE LENGTH: Very tight HS 45 degree SLR   POSTURE: rounded shoulders and forward head  LOWER EXTREMITY ROM:     Active  Right Eval Left Eval  Hip flexion 70 70  Hip extension    Hip abduction 10 10  Hip adduction    Hip internal rotation    Hip external rotation    Knee  flexion    Knee extension 10 10  Ankle dorsiflexion    Ankle plantarflexion    Ankle inversion    Ankle eversion     (Blank rows = not tested)  LOWER EXTREMITY MMT:    MMT Right Eval Left Eval  Hip flexion 4 4  Hip extension 4- 4-  Hip abduction 4- 4-  Hip adduction    Hip internal rotation    Hip external rotation    Knee flexion 4- 4-  Knee extension 4- 4-  Ankle dorsiflexion    Ankle plantarflexion    Ankle inversion    Ankle eversion    (Blank rows = not tested)  STAIRS: Uses hand rail, can do step over step but is slow, caught toe GAIT: Findings: no device, minor limp on the right, has significant pronation at the feet L>R, knees slightly flexed, has a tremor in the left hand, shuffles feet  FUNCTIONAL TESTS:  5 times sit to stand: 27 seconds Timed up and go (TUG): 18 seconds BERG 43/56                                                                                                                             TREATMENT DATE:  11/09/24 -NuStep x7 mins level 5 resistance seat 11, UE 11 -seated physioball press x30 reps, 3 sec hold -knee flexion extension on physioball x15 BLE -3 way hip in standing 2x10 BLE with HHA in // bars -Standing PNF D1 LE pattern with HHA in // bars, 2x10 BLE alternating between reps. - step ups x15 BLE lead at 6 inch  step with HHA   10/19/24: pt used both hands for support for all of the below activities:  In ll bars for high marches, trying to match height of bar with each leg 15x In ll bars for heel toe rocks on airex 15x In ll bars, lateral travelling over airex, 15x In ll bars for four square, one block stepping clockwise and counterclockwise 5 reps each direction  Nustep L 6 x 6 min Supine for hamstring stretch, pt using yoga strap to assist, therapist providing manual overpressure to isolate posterior knee, 2 reps each side 45 sec each Prone for post prox femur stabilization, for manual quads/hip flexor stretch 45 sec holds, 2 x  each leg  Sit to stand with red mediball punches, B feet on airex, 10 x from hi/low table  Standing marching on airex Standing on incline board for B plantarflexor stretch, 30 sec holds, 3 x Tandem standing, 25 sec each leg   10/15/24 Evaluation and HEP gave information regarding inserts to help support foot   PATIENT EDUCATION: Education details: POC/HEP Person educated: Patient Education method: Programmer, Multimedia, Facilities Manager, Verbal cues, and Handouts Education comprehension: verbalized understanding  HOME EXERCISE PROGRAM: Access Code: [email or web address] URL: [email or web address] Date: 10/15/2024 Prepared by: Ozell Mainland  Exercises - Single Leg Stance with Support  - 2 x daily - 7 x weekly - 1 sets - 10 reps - 10 hold - Standing Tandem Balance with Counter Support  - 2 x daily - 7 x weekly - 1 sets - 10 reps - 10 hold - Single Leg Balance with Clock Reach  - 2 x daily - 7 x weekly - 1 sets - 10 reps - 3 hold - Romberg Stance with Head Nods  - 2 x daily - 7 x weekly - 1 sets - 10 reps - 10 hold  GOALS: Goals reviewed with patient? Yes  SHORT TERM GOALS: Target date: 11/08/24  Independent with initial HEP Baseline: Goal status: INITIAL  LONG TERM GOALS: Target date: 01/14/24  Independent with advanced HEP/gym Baseline:  Goal status: INITIAL  2.  Decrease TUG time to 12 seconds Baseline: 18 seconds Goal status: INITIAL  3.  Improve berg balance score to 48/56 Baseline: 43/56 Goal status: INITIAL  4.  Be able to stand SLS x 10 seconds on both feet Baseline:  Goal status: INITIAL  5.  Improve LE strength to 4+/5 Baseline:  Goal status: INITIAL  ASSESSMENT:  CLINICAL IMPRESSION: Patient is a 68 y.o. male who participated today in physical therapy treatment for parkinson's, weakness, and balance issues. Today we incorporated activities to progress coordination, strength, stability, and endurance through functional movement patterns. Pt has dec tolerance  to L hip flexor activity with onset of soreness noted, resolves with rest. Will benefit from physical therapy to address his impairments and for comprehensive home routine.  OBJECTIVE IMPAIRMENTS: Abnormal gait, cardiopulmonary status limiting activity, decreased activity tolerance, decreased balance, decreased coordination, decreased endurance, decreased mobility, difficulty walking, decreased ROM, decreased strength, increased muscle spasms, impaired flexibility, improper body mechanics, postural dysfunction, and pain.   REHAB POTENTIAL: Good  CLINICAL DECISION MAKING: Stable/uncomplicated  EVALUATION COMPLEXITY: Low  PLAN:  PT FREQUENCY: 1x/week  PT DURATION: 12 weeks  PLANNED INTERVENTIONS: 97164- PT Re-evaluation, 97110-Therapeutic exercises, 97530- Therapeutic activity, 97112- Neuromuscular re-education, 97535- Self Care, 02859- Manual therapy, [id number]- Gait training, Patient/Family education, Balance training, Stair training, and Taping  PLAN FOR NEXT SESSION: work on balance, strength and gait   Stann DELENA Ohara, PT 11/09/2024, 1:59 PM        "

## 2024-11-11 ENCOUNTER — Encounter: Payer: Self-pay | Admitting: Speech Pathology

## 2024-11-11 ENCOUNTER — Ambulatory Visit: Admitting: Speech Pathology

## 2024-11-11 DIAGNOSIS — R262 Difficulty in walking, not elsewhere classified: Secondary | ICD-10-CM | POA: Diagnosis not present

## 2024-11-11 DIAGNOSIS — R41841 Cognitive communication deficit: Secondary | ICD-10-CM

## 2024-11-11 DIAGNOSIS — R1310 Dysphagia, unspecified: Secondary | ICD-10-CM

## 2024-11-11 DIAGNOSIS — R471 Dysarthria and anarthria: Secondary | ICD-10-CM

## 2024-11-11 NOTE — Therapy (Signed)
 " OUTPATIENT SPEECH LANGUAGE PATHOLOGY TREATMENT   Patient Name: Edwin Gallagher MRN: [id number] DOB:[birth year], 68 y.o., male Today's Date: 11/11/2024  PCP: Valentin Skates, DO REFERRING PROVIDER: Valentin Skates, DO  END OF SESSION:  End of Session - 11/11/24 0937     Visit Number 2    Date for Recertification  01/03/25    SLP Start Time 0930    SLP Stop Time  1010    SLP Time Calculation (min) 40 min    Activity Tolerance Patient tolerated treatment well          Past Medical History:  Diagnosis Date   Abnormal cardiac CT angiography 2006   only abnormal with soft plaque in LAD calcium  score is0    Abnormal LFTs    Balance problem    CAD (coronary artery disease)    a. has seen Dr. Morris in the past and had cardiac CT 2006 with calcium  score of 0. He had soft plaque in LAD of about 50%.  He had a normal stress echo in 2012. He also had a normal nuc in 2016.   Chest discomfort    Diabetes mellitus    Forgetfulness    Hyperlipidemia    Hypertension    Obesity    OSA (obstructive sleep apnea)    Paroxysmal atrial fibrillation (HCC)    Tremor    Past Surgical History:  Procedure Laterality Date   ROTATOR CUFF REPAIR  2009   Right   TRIGGER FINGER RELEASE  02/12/2012   Procedure: MINOR RELEASE TRIGGER FINGER/A-1 PULLEY;  Surgeon: Lamar LULLA Leonor Mickey., MD;  Location: [facility];  Service: Orthopedics;  Laterality: Right;  right thumb   Patient Active Problem List   Diagnosis Date Noted   Parkinsonism (HCC) 01/18/2022   Parkinson's disease (HCC) 02/21/2021   Mild cognitive impairment 02/21/2021   Low back pain 02/21/2021   Bilateral low back pain without sciatica 01/25/2021   Tremor 01/05/2021   Gait abnormality 11/03/2020   Left-sided weakness 11/03/2020   Chronic left shoulder pain 11/03/2020   REM behavioral disorder 08/24/2020   Coagulation defect 07/06/2020   Paroxysmal atrial fibrillation (HCC) 01/06/2019   OSA (obstructive sleep apnea)  09/24/2011   Chest pain 05/11/2011   Hypertension 05/11/2011   Obesity 05/11/2011   Hyperlipidemia 05/11/2011    ONSET DATE: Referred on  09/23/24  REFERRING DIAG: G20.A1 (ICD-10-CM) - Parkinson's disease without dyskinesia, without mention of fluctuations   THERAPY DIAG:  Dysphagia, unspecified type  Cognitive communication deficit  Dysarthria and anarthria  Rationale for Evaluation and Treatment: Rehabilitation  SUBJECTIVE:   SUBJECTIVE STATEMENT: I forgot to ask my wife to come.  Pt accompanied by: self  PERTINENT HISTORY: History of nonobstructive CAD, paroxysmal atrial fibrillation, hypertension, hyperlipidemia, type 2 diabetes, Parkinson's disease, OSA   PAIN:  Are you having pain? No  FALLS: Has patient fallen in last 6 months?  No  LIVING ENVIRONMENT: Lives with: lives with their family; [place] Lives in: House/apartment  PLOF:  Level of assistance: Independent with ADLs, Independent with IADLs Employment: Retired; [employer]   PATIENT GOALS: swallowing, voice   OBJECTIVE:  Note: Objective measures were completed at Evaluation unless otherwise noted.  DIAGNOSTIC FINDINGS: Scheduled MBS on 1/15  COGNITION: Overall cognitive status: Impaired: Areas of impairment:  Memory: Impaired: Short term Prospective  Functional deficits: Need to complete cognitive PROM; may have some decreased awareness   MOTOR SPEECH: Overall motor speech: impaired Level of impairment: Sentence and Conversation Respiration: Normal Phonation: normal Resonance:  WFL Articulation: Appears intact Intelligibility: Intelligibility reduced Motor planning: Appears intact Intensity: Reduced (see voice)  Effective technique: increased vocal intensity  SOCIAL HISTORY: Occupation: Retired Counsellor intake: suboptimal; 2 bottles Caffeine/alcohol intake: minimal Daily voice use: moderate  PERCEPTUAL VOICE ASSESSMENT: Voice quality: low vocal intensity Vocal abuse: NA Resonance:  normal Respiratory function: Normal  OBJECTIVE VOICE ASSESSMENT: Maximum phonation time for sustained ah: 15 s Conversational pitch average: 109 Hz Conversational pitch range: 92-128 Hz Conversational loudness average: 70 dB Conversational loudness range: 59-80 dB S/z ratio: 1.0 (Suggestive of dysfunction >1.0) /s/: 15 s /z/: 15 s  ORAL MOTOR EXAMINATION: Overall status: WFL Comments:   RECOMMENDATIONS FROM OBJECTIVE SWALLOW STUDY (MBSS/FEES):    **EVALUATION SCHEDULED ON 11/27/23    CLINICAL SWALLOW ASSESSMENT:   Current diet: regular and thin liquids Dentition: adequate natural dentition Patient directly observed with POs: Yes: thin liquids  Feeding: able to feed self Liquids provided by: bottle ([facility] Swallow Protocol)  Oral phase signs and symptoms: None noted today Pharyngeal phase signs and symptoms: None noted today Comments: plans for MBS on Jan 15 due to reported symptoms  STANDARDIZED ASSESSMENTS: SLUMS: 26/30  PATIENT REPORTED OUTCOME MEASURES (PROM): EAT-10: 5/40  VHI-10: 7/40                                                                                                                            TREATMENT DATE:   11/11/24: **insurance changing to [employer] 1/1 - Cont same POC. Pt was seen for skilled ST services targeting continued assessment and dysphagia follow up. He reports he is very cognizant of his swallow to not choke.  He reports he over chews his food and eats at a slower rate. He has to make sure his pills are facing straight in his mouth to facilitate easier transition. SLP suggested trying pills in puree to see if this is more comfortable and reduces symptoms. SLP educated on Think Loud and Speak Out/with Intent for Parkinsons and directed him to [email or web address] for further information on sx associated with PD.    **[facility] is closest - if wanting to transition due to SLP maternity leave.   PATIENT EDUCATION: Education details: PD and  related changes Person educated: Patient Education method: Explanation Education comprehension: needs further education   GOALS: Goals reviewed with patient? No  SHORT TERM GOALS: Target date: 12/05/23  Pt will complete voice assessment and goals will be updated per patient's wishes.  Baseline: Goal status: MET  2.  Pt will participate in MBS for further insight into swallowing deficits and goals will be updated to include exercises as warranted.  Baseline:  Goal status: INITIAL  3.  Pt will participate in SPEAK OUT! Or LSVT Loud to improve vocal volume.  Baseline:  Goal status: INITIAL    LONG TERM GOALS: Target date: 01/05/24  Pt will improve score on PROM Baseline:  Goal status: INITIAL  2.  Pt will report use of external memory strategies  Baseline:  Goal status: INITIAL  3.  Pt will demonstrate improved vocal volume .  Baseline: 70 dB Goal status: INITIAL    ASSESSMENT:  CLINICAL IMPRESSION: Pt is a 68 yo male who presents to ST OP for evaluation for Parkinson's Disease. Pt endorses re: choking with liquids/solids and pills getting stuck and changes in thinking skills (memory). He reports he has always been soft spoken. Pt is responsible for medication management, wife helps with financial management. Pt was assessed using PROMs, SLUMS, and voice measures. SLP observed decreased vocal volume during today's assessment. SLP rec skilled ST services to address cognitive-communication, dysarthria and dysphagia.    OBJECTIVE IMPAIRMENTS: include memory, dysarthria, and dysphagia. These impairments are limiting patient from managing medications, managing appointments, managing finances, household responsibilities, effectively communicating at home and in community, and safety when swallowing. Factors affecting potential to achieve goals and functional outcome are NA. Patient will benefit from skilled SLP services to address above impairments and improve overall  function.  REHAB POTENTIAL: Good  PLAN:  SLP FREQUENCY: 1x/week  SLP DURATION: 8 weeks  PLANNED INTERVENTIONS: Aspiration precaution training, Pharyngeal strengthening exercises, Diet toleration management , Environmental controls, Cueing hierachy, SLP instruction and feedback, Compensatory strategies, Patient/family education, [phone] Treatment of speech (30 or 45 min) , and [id number] Treatment of swallowing function    [employer], CCC-SLP 11/11/2024, 9:38 AM           "

## 2024-11-16 ENCOUNTER — Encounter: Payer: Self-pay | Admitting: Speech Pathology

## 2024-11-16 ENCOUNTER — Encounter: Payer: Self-pay | Admitting: Occupational Therapy

## 2024-11-16 ENCOUNTER — Ambulatory Visit: Attending: Internal Medicine | Admitting: Speech Pathology

## 2024-11-16 ENCOUNTER — Other Ambulatory Visit: Payer: Self-pay

## 2024-11-16 ENCOUNTER — Ambulatory Visit: Admitting: Occupational Therapy

## 2024-11-16 ENCOUNTER — Ambulatory Visit

## 2024-11-16 DIAGNOSIS — R41841 Cognitive communication deficit: Secondary | ICD-10-CM | POA: Insufficient documentation

## 2024-11-16 DIAGNOSIS — R2681 Unsteadiness on feet: Secondary | ICD-10-CM

## 2024-11-16 DIAGNOSIS — M25622 Stiffness of left elbow, not elsewhere classified: Secondary | ICD-10-CM | POA: Insufficient documentation

## 2024-11-16 DIAGNOSIS — M6281 Muscle weakness (generalized): Secondary | ICD-10-CM

## 2024-11-16 DIAGNOSIS — R1310 Dysphagia, unspecified: Secondary | ICD-10-CM | POA: Insufficient documentation

## 2024-11-16 DIAGNOSIS — M25621 Stiffness of right elbow, not elsewhere classified: Secondary | ICD-10-CM

## 2024-11-16 DIAGNOSIS — R278 Other lack of coordination: Secondary | ICD-10-CM

## 2024-11-16 DIAGNOSIS — M25611 Stiffness of right shoulder, not elsewhere classified: Secondary | ICD-10-CM

## 2024-11-16 DIAGNOSIS — R262 Difficulty in walking, not elsewhere classified: Secondary | ICD-10-CM

## 2024-11-16 DIAGNOSIS — R29818 Other symptoms and signs involving the nervous system: Secondary | ICD-10-CM

## 2024-11-16 DIAGNOSIS — R471 Dysarthria and anarthria: Secondary | ICD-10-CM | POA: Insufficient documentation

## 2024-11-16 DIAGNOSIS — M25612 Stiffness of left shoulder, not elsewhere classified: Secondary | ICD-10-CM

## 2024-11-16 NOTE — Therapy (Signed)
 "  OUTPATIENT OCCUPATIONAL THERAPY PARKINSON'S treatment  Patient Name: Edwin Gallagher MRN: [id number] DOB:1956-04-22, 69 y.o., male Today's Date: 11/16/2024  PCP: Valentin Skates DO REFERRING PROVIDER: Valentin Skates DO  END OF SESSION:  OT End of Session - 11/16/24 0808     Visit Number 2    Date for Recertification  02/01/25    Authorization Type [employer]    Authorization - Visit Number 2    Progress Note Due on Visit 10    OT Start Time 0804    OT Stop Time 0845    OT Time Calculation (min) 41 min          Past Medical History:  Diagnosis Date   Abnormal cardiac CT angiography 2006   only abnormal with soft plaque in LAD calcium  score is0    Abnormal LFTs    Balance problem    CAD (coronary artery disease)    a. has seen Dr. Morris in the past and had cardiac CT 2006 with calcium  score of 0. He had soft plaque in LAD of about 50%.  He had a normal stress echo in 2012. He also had a normal nuc in 2016.   Chest discomfort    Diabetes mellitus    Forgetfulness    Hyperlipidemia    Hypertension    Obesity    OSA (obstructive sleep apnea)    Paroxysmal atrial fibrillation (HCC)    Tremor    Past Surgical History:  Procedure Laterality Date   ROTATOR CUFF REPAIR  2009   Right   TRIGGER FINGER RELEASE  02/12/2012   Procedure: MINOR RELEASE TRIGGER FINGER/A-1 PULLEY;  Surgeon: Lamar LULLA Leonor Mickey., MD;  Location: [facility];  Service: Orthopedics;  Laterality: Right;  right thumb   Patient Active Problem List   Diagnosis Date Noted   Parkinsonism (HCC) 01/18/2022   Parkinson's disease (HCC) 02/21/2021   Mild cognitive impairment 02/21/2021   Low back pain 02/21/2021   Bilateral low back pain without sciatica 01/25/2021   Tremor 01/05/2021   Gait abnormality 11/03/2020   Left-sided weakness 11/03/2020   Chronic left shoulder pain 11/03/2020   REM behavioral disorder 08/24/2020   Coagulation defect 07/06/2020   Paroxysmal atrial fibrillation (HCC)  01/06/2019   OSA (obstructive sleep apnea) 09/24/2011   Chest pain 05/11/2011   Hypertension 05/11/2011   Obesity 05/11/2011   Hyperlipidemia 05/11/2011    ONSET DATE: 09/23/24  REFERRING DIAG: Parkinson's disease  THERAPY DIAG:  Muscle weakness (generalized)  Other lack of coordination  Stiffness of left shoulder, not elsewhere classified  Stiffness of right shoulder, not elsewhere classified  Stiffness of left elbow, not elsewhere classified  Stiffness of right elbow, not elsewhere classified  Other symptoms and signs involving the nervous system  Rationale for Evaluation and Treatment: Rehabilitation  SUBJECTIVE:   SUBJECTIVE STATEMENT: No reports of pain Pt accompanied by: self  PERTINENT HISTORY: Parkinson's sdisease diagnosed 3 years ago per pt.History of nonobstructive CAD, paroxysmal atrial fibrillation, hypertension, hyperlipidemia, type 2 diabetes,  OSA  PRECAUTIONS: Fall, swallowing issues, has MBS scheduled  WEIGHT BEARING RESTRICTIONS: No  PAIN:  Are you having pain? No  FALLS: Has patient fallen in last 6 months? No  LIVING ENVIRONMENT: Lives with: lives with their family Lives in: House/apartment Stairs: internal   PLOF: Independent  PATIENT GOALS: maintain independence   OBJECTIVE:  Note: Objective measures were completed at Evaluation unless otherwise noted.  HAND DOMINANCE: Right  ADLs: Overall ADLs: increased time Transfers/ambulation related to ADLs:mod  I Eating: mod I, swallowing issues Grooming: mod I UB Dressing: mod I LB Dressing: difficulty with shoes and socks Toileting: mod I  Bathing: mod I Tub Shower transfers:mod I- walk in shower  IADLs: Shopping: mod I Light housekeeping: light cleaning only Meal Prep: minimal cooking only Community mobility: mod I Medication management: pt handles own meds Financial management: pt/ wife handle together Handwriting: 100% legible  MOBILITY STATUS:  Independent   FUNCTIONAL OUTCOME MEASURES: Fastening/unfastening 3 buttons: 33.50 secs Physical performance test: PPT#2 (simulated eating) NT & PPT#4 (donning/doffing jacket): 17.13  COORDINATION: 9 Hole Peg test: Right: 28.39 sec; Left: 32.70 sec Box and Blocks:  Right 47blocks, Left 45 blocks Tremors: Resting for LUE  UE ROM:  Shoulder flexion RUE 110-hx of RC repair, LUE 115, elbow extension: RUE -30, LUE -30, RUE decreased wrist ext 40    SENSATION: Not tested  MUSCLE TONE: LUE: Mild and Rigidity  COGNITION: Overall cognitive status: not formally tested, will further assess in a functional context                                                                                                                     TREATMENT DATE: 11/16/24- UBE x 6 mins level 1 for conditioning, min v.c to maintain 40 rpm, Pt was instructed in PWR! basic 4 seated 10 reps each, PWR! hands basic 4 5-10 reps each, demonstration, mod v.c/ facilitation for amplitude and postioning Flipping and dealing playing cards with big movments for increased fine motor coordiantion, mod v.c and demonstration   11/09/24 eval   PATIENT EDUCATION: Education details: PWR! basic 4 seated 10 reps each, PWR! hands basic 4 5-10 reps each, demonstration, mod v.c/ facilitation for amplitude and postioning Flipping and dealing playing cards with big movments for increased fine motor coordiantion, mod v.c and demonstration Person educated: Patient Education method: Explanation, demonstration, verbal and tactile cues, handout Education comprehension: verbalized understanding, retruend demonstration, will benefit from further review  HOME EXERCISE PROGRAM: 11/16/24- PWR! seated, PWR! hands, flipping/ dealing cards  GOALS: Goals reviewed with patient? Yes  SHORT TERM GOALS: Target date: 12/10/24  I with PD specific HEP Baseline: Goal status: ongoing issued 11/16/24  2.  I with adapted strategies to maximize pt's  safety and I with ADLs/I ADLs Baseline:  Goal status: INITIAL  3.  Pt will demontrate improved ease with dressing as eviednced by decreasing PPT# 4 to 15 secs or less. Baseline:  Goal status: INITIAL  4.  Pt will demonstrate improved bilateral UE functional use as evidenced by decreasing 3 button/ unbutton by 3 secs. Baseline:  Goal status: INITIAL   LONG TERM GOALS: Target date: 01/04/25 Pt will demonstrate improved bilateral functional use as evidenced by decreasing 3 button/ unbutton score by 3 secs. Baseline:  Goal status: INITIAL  2.  Pt will demonstrate improved bilateral UE functional use as evidenced by increasing bilateral box/ blocks score by 3 blocks Baseline:  Goal status: INITIAL  3.  Pt will increase functional reach with LUE  to 120 shoulder flexion with -25 elbow extension for increased functional use. Baseline:  Goal status: INITIAL  4.  Pt. will demonstrate ability to retrieve a lightweight object at 115 shoulder lexion and -25 elbow extnesion with RUE Baseline:  Goal status: INITIAL  5.Pt will verbalize understanding of ways to prevent future PD related complications and PD community resources. Baseline:  Goal status: INITIAL  ASSESSMENT:  CLINICAL IMPRESSION: Patient is progressing towards goals. He demonstrates understanding of inital HEP however he can benefit from review for amplitude and positioning. PERFORMANCE DEFICITS: in functional skills including ADLs, IADLs, coordination, dexterity, tone, ROM, strength, flexibility, Fine motor control, Gross motor control, mobility, balance, endurance, decreased knowledge of precautions, decreased knowledge of use of DME, and UE functional use, mild cognitive deficits and psychosocial skills including coping strategies, environmental adaptation, habits, interpersonal interactions, and routines and behaviors.   IMPAIRMENTS: are limiting patient from ADLs, IADLs, play, leisure, and social participation.    COMORBIDITIES:  may have co-morbidities  that affects occupational performance. Patient will benefit from skilled OT to address above impairments and improve overall function.  MODIFICATION OR ASSISTANCE TO COMPLETE EVALUATION: No modification of tasks or assist necessary to complete an evaluation.  OT OCCUPATIONAL PROFILE AND HISTORY: Detailed assessment: Review of records and additional review of physical, cognitive, psychosocial history related to current functional performance.  CLINICAL DECISION MAKING: LOW - limited treatment options, no task modification necessary  REHAB POTENTIAL: Good  EVALUATION COMPLEXITY: Low    PLAN:  OT FREQUENCY: 1-2x/week  OT DURATION: 12 weeks  PLANNED INTERVENTIONS: 97168 OT Re-evaluation, 97535 self care/ADL training, 02889 therapeutic exercise, 97530 therapeutic activity, 97112 neuromuscular re-education, 97140 manual therapy, 97035 ultrasound, 97018 paraffin, 02989 moist heat, 97129 Cognitive training (first 15 min), 02869 Cognitive training(each additional 15 min), passive range of motion, balance training, functional mobility training, energy conservation, coping strategies training, patient/family education, and DME and/or AE instructions  RECOMMENDED OTHER SERVICES: n/a  CONSULTED AND AGREED WITH PLAN OF CARE: Patient  PLAN FOR NEXT SESSION: review HEP, ADL strategies   Kiela Shisler, OT 11/16/2024, 9:09 AM   "

## 2024-11-16 NOTE — Patient Instructions (Signed)
" ° ° ° ° °  PWR! Hands  With arms stretched out in front of you (elbows straight), perform the following: PWR! Rock: Move wrists up and down [place]! Twist: Twist palms up and down BIG  Then, start with elbows bent and hands closed. PWR! Step: Touch index finger to thumb while keeping other fingers straight. Flick fingers out BIG (thumb out/straighten fingers). Repeat with other fingers. (Step your thumb to each finger). PWR! Hands: Push hands out BIG. Elbows straight, wrists up, fingers open and spread apart BIG. (Can also perform by pushing down on table, chair, knees. Push above head, out to the side, behind you, in front of you.)   ** Make each movement big and deliberate so that you feel the movement.  Perform at least 10 repetitions 1x/day, but perform PWR! hands throughout the day when you are having trouble using your hands (picking up/manipulating small objects, writing, eating, typing, sewing, buttoning, etc.).        Coordination Exercises  Perform the following exercises for 10-20 minutes 1 times per day. Perform with both hand(s). Perform using big movements. Flipping Cards: Place deck of cards on the table. Flip cards over by opening your hand big to grasp and then turn your palm up big. Deal cards: Hold 1/2 or whole deck in your hand. Use thumb to push card off top of deck with one big push. Practice writing: Slow down, write big, and focus on forming each letter. Perform Flicks/hand stretches (PWR! Hands): Close hands then flick out your fingers with focus on opening hands, pulling wrists back, and extending elbows like you are pushing. "

## 2024-11-16 NOTE — Therapy (Signed)
 " OUTPATIENT PHYSICAL THERAPY NEURO TREATMENT   Patient Name: Edwin Gallagher MRN: [id number] DOB:1956/06/05, 69 y.o., male Today's Date: 11/16/2024   PCP: Valentin HAS REFERRING PROVIDER: Valentin, DO  END OF SESSION:  PT End of Session - 11/16/24 1024     Visit Number 4    Date for Recertification  01/14/24    Authorization Type [employer]na medicare    PT Start Time 1016    PT Stop Time 1100    PT Time Calculation (min) 44 min             Past Medical History:  Diagnosis Date   Abnormal cardiac CT angiography 2006   only abnormal with soft plaque in LAD calcium  score is0    Abnormal LFTs    Balance problem    CAD (coronary artery disease)    a. has seen Dr. Morris in the past and had cardiac CT 2006 with calcium  score of 0. He had soft plaque in LAD of about 50%.  He had a normal stress echo in 2012. He also had a normal nuc in 2016.   Chest discomfort    Diabetes mellitus    Forgetfulness    Hyperlipidemia    Hypertension    Obesity    OSA (obstructive sleep apnea)    Paroxysmal atrial fibrillation (HCC)    Tremor    Past Surgical History:  Procedure Laterality Date   ROTATOR CUFF REPAIR  2009   Right   TRIGGER FINGER RELEASE  02/12/2012   Procedure: MINOR RELEASE TRIGGER FINGER/A-1 PULLEY;  Surgeon: Lamar LULLA Leonor Mickey., MD;  Location: [facility];  Service: Orthopedics;  Laterality: Right;  right thumb   Patient Active Problem List   Diagnosis Date Noted   Parkinsonism (HCC) 01/18/2022   Parkinson's disease (HCC) 02/21/2021   Mild cognitive impairment 02/21/2021   Low back pain 02/21/2021   Bilateral low back pain without sciatica 01/25/2021   Tremor 01/05/2021   Gait abnormality 11/03/2020   Left-sided weakness 11/03/2020   Chronic left shoulder pain 11/03/2020   REM behavioral disorder 08/24/2020   Coagulation defect 07/06/2020   Paroxysmal atrial fibrillation (HCC) 01/06/2019   OSA (obstructive sleep apnea) 09/24/2011   Chest pain 05/11/2011    Hypertension 05/11/2011   Obesity 05/11/2011   Hyperlipidemia 05/11/2011    ONSET DATE: 09/14/24  REFERRING DIAG: Parkinson's, difficulty walking  THERAPY DIAG:  Muscle weakness (generalized)  Other lack of coordination  Unsteady gait  Difficulty in walking, not elsewhere classified  Rationale for Evaluation and Treatment: Rehabilitation  SUBJECTIVE:  SUBJECTIVE STATEMENT: No new reports today  Pt accompanied by: self  PERTINENT HISTORY: CAD, DM, HTN, A fib  PAIN:  Are you having pain? No pain now but reports occasional back pain  PRECAUTIONS: None  RED FLAGS: None   WEIGHT BEARING RESTRICTIONS: No  FALLS: Has patient fallen in last 6 months? No  LIVING ENVIRONMENT: Lives with: lives with their family Lives in: House/apartment Stairs: Yes: Internal: 16 steps; bilateral but cannot reach both Has following equipment at home: Single point cane  PLOF: Independent and does some house and yard work, [employer] of [employer] goes less than one time a week  PATIENT GOALS: have better balance maybe do more at [employer]  OBJECTIVE:  Note: Objective measures were completed at Evaluation unless otherwise noted.  DIAGNOSTIC FINDINGS: none  COGNITION: Overall cognitive status: Within functional limits for tasks assessed   SENSATION: WFL  MUSCLE LENGTH: Very tight HS 45 degree SLR   POSTURE: rounded shoulders and forward head  LOWER EXTREMITY ROM:     Active  Right Eval Left Eval  Hip flexion 70 70  Hip extension    Hip abduction 10 10  Hip adduction    Hip internal rotation    Hip external rotation    Knee flexion    Knee extension 10 10  Ankle dorsiflexion    Ankle plantarflexion    Ankle inversion    Ankle eversion     (Blank rows = not tested)  LOWER EXTREMITY MMT:     MMT Right Eval Left Eval  Hip flexion 4 4  Hip extension 4- 4-  Hip abduction 4- 4-  Hip adduction    Hip internal rotation    Hip external rotation    Knee flexion 4- 4-  Knee extension 4- 4-  Ankle dorsiflexion    Ankle plantarflexion    Ankle inversion    Ankle eversion    (Blank rows = not tested)  STAIRS: Uses hand rail, can do step over step but is slow, caught toe GAIT: Findings: no device, minor limp on the right, has significant pronation at the feet L>R, knees slightly flexed, has a tremor in the left hand, shuffles feet  FUNCTIONAL TESTS:  5 times sit to stand: 27 seconds Timed up and go (TUG): 18 seconds BERG 43/56                                                                                                                             TREATMENT DATE:  11/16/24: Nustep L 5 x 6 min In ll bars, with 1 1/2# cuff wts ankles, for high marching, using height of bars as target to achieve large amplitude movement In ll bars for lateral step over airex balance beam with 1 1/2# cuff wts ankles In ll bars post steps over airex balance beam  On hi low table in prone for post stabilization of prox femur, prone hip and knee flexion stretch to open ant  hip tissues Supine stretch each hamstring therapist providing overpressure and pt using yoga strap to assist , mutiple reps each leg Standing on wedge for B ankle dorsiflexion stretch frequent cues to extend hips  knee flexion extension on physioball x15 BLE Lateral step ups onto 4 step, 10 x each leg, light touch hands for support, frequent cues for achieving upright posture  11/09/24 -NuStep x7 mins level 5 resistance seat 11, UE 11 -seated physioball press x30 reps, 3 sec hold -knee flexion extension on physioball x15 BLE -3 way hip in standing 2x10 BLE with HHA in // bars -Standing PNF D1 LE pattern with HHA in // bars, 2x10 BLE alternating between reps. - step ups x15 BLE lead at 6 inch step with HHA   10/19/24:  pt used both hands for support for all of the below activities:  In ll bars for high marches, trying to match height of bar with each leg 15x In ll bars for heel toe rocks on airex 15x In ll bars, lateral travelling over airex, 15x In ll bars for four square, one block stepping clockwise and counterclockwise 5 reps each direction  Nustep L 6 x 6 min Supine for hamstring stretch, pt using yoga strap to assist, therapist providing manual overpressure to isolate posterior knee, 2 reps each side 45 sec each Prone for post prox femur stabilization, for manual quads/hip flexor stretch 45 sec holds, 2 x each leg  Sit to stand with red mediball punches, B feet on airex, 10 x from hi/low table  Standing marching on airex Standing on incline board for B plantarflexor stretch, 30 sec holds, 3 x Tandem standing, 25 sec each leg   10/15/24 Evaluation and HEP gave information regarding inserts to help support foot   PATIENT EDUCATION: Education details: POC/HEP Person educated: Patient Education method: Programmer, Multimedia, Facilities Manager, Verbal cues, and Handouts Education comprehension: verbalized understanding  HOME EXERCISE PROGRAM: Access Code: [email or web address] URL: [email or web address] Date: 10/15/2024 Prepared by: Ozell Mainland  Exercises - Single Leg Stance with Support  - 2 x daily - 7 x weekly - 1 sets - 10 reps - 10 hold - Standing Tandem Balance with Counter Support  - 2 x daily - 7 x weekly - 1 sets - 10 reps - 10 hold - Single Leg Balance with Clock Reach  - 2 x daily - 7 x weekly - 1 sets - 10 reps - 3 hold - Romberg Stance with Head Nods  - 2 x daily - 7 x weekly - 1 sets - 10 reps - 10 hold  GOALS: Goals reviewed with patient? Yes  SHORT TERM GOALS: Target date: 11/08/24  Independent with initial HEP Baseline: Goal status: INITIAL  LONG TERM GOALS: Target date: 01/14/24  Independent with advanced HEP/gym Baseline:  Goal status: INITIAL  2.  Decrease TUG time to  12 seconds Baseline: 18 seconds Goal status: INITIAL  3.  Improve berg balance score to 48/56 Baseline: 43/56 Goal status: INITIAL  4.  Be able to stand SLS x 10 seconds on both feet Baseline:  Goal status: INITIAL  5.  Improve LE strength to 4+/5 Baseline:  Goal status: INITIAL  ASSESSMENT:  CLINICAL IMPRESSION: Patient is a 69 y.o. male who participated today in physical therapy treatment for Parkinson's, weakness, and balance issues. Today we incorporated activities to progress coordination, strength, stability, and endurance through functional movement patterns. Encouraged to get in prone at home and work on achieving a less flexed posture. He continues to need education and  encouragement to address his changes and movement patterns related to his Parkinson's diagnosis.  OBJECTIVE IMPAIRMENTS: Abnormal gait, cardiopulmonary status limiting activity, decreased activity tolerance, decreased balance, decreased coordination, decreased endurance, decreased mobility, difficulty walking, decreased ROM, decreased strength, increased muscle spasms, impaired flexibility, improper body mechanics, postural dysfunction, and pain.   REHAB POTENTIAL: Good  CLINICAL DECISION MAKING: Stable/uncomplicated  EVALUATION COMPLEXITY: Low  PLAN:  PT FREQUENCY: 1x/week  PT DURATION: 12 weeks  PLANNED INTERVENTIONS: 97164- PT Re-evaluation, 97110-Therapeutic exercises, 97530- Therapeutic activity, 97112- Neuromuscular re-education, 97535- Self Care, 02859- Manual therapy, [phone]- Gait training, Patient/Family education, Balance training, Stair training, and Taping  PLAN FOR NEXT SESSION: work on balance, strength and gait   Dean Goldner L Mikaylee Arseneau, PT, DPT, OCS 11/16/2024, 4:04 PM        "

## 2024-11-18 NOTE — Therapy (Signed)
 " OUTPATIENT SPEECH LANGUAGE PATHOLOGY TREATMENT   Patient Name: Edwin Gallagher MRN: [id number] DOB:November 01, 1956, 69 y.o., male Today's Date: 11/18/2024  PCP: Valentin Skates, DO REFERRING PROVIDER: Valentin Skates, DO  END OF SESSION:    Past Medical History:  Diagnosis Date   Abnormal cardiac CT angiography 2006   only abnormal with soft plaque in LAD calcium  score is0    Abnormal LFTs    Balance problem    CAD (coronary artery disease)    a. has seen Dr. Morris in the past and had cardiac CT 2006 with calcium  score of 0. He had soft plaque in LAD of about 50%.  He had a normal stress echo in 2012. He also had a normal nuc in 2016.   Chest discomfort    Diabetes mellitus    Forgetfulness    Hyperlipidemia    Hypertension    Obesity    OSA (obstructive sleep apnea)    Paroxysmal atrial fibrillation (HCC)    Tremor    Past Surgical History:  Procedure Laterality Date   ROTATOR CUFF REPAIR  2009   Right   TRIGGER FINGER RELEASE  02/12/2012   Procedure: MINOR RELEASE TRIGGER FINGER/A-1 PULLEY;  Surgeon: Lamar LULLA Leonor Mickey., MD;  Location: [facility];  Service: Orthopedics;  Laterality: Right;  right thumb   Patient Active Problem List   Diagnosis Date Noted   Parkinsonism (HCC) 01/18/2022   Parkinson's disease (HCC) 02/21/2021   Mild cognitive impairment 02/21/2021   Low back pain 02/21/2021   Bilateral low back pain without sciatica 01/25/2021   Tremor 01/05/2021   Gait abnormality 11/03/2020   Left-sided weakness 11/03/2020   Chronic left shoulder pain 11/03/2020   REM behavioral disorder 08/24/2020   Coagulation defect 07/06/2020   Paroxysmal atrial fibrillation (HCC) 01/06/2019   OSA (obstructive sleep apnea) 09/24/2011   Chest pain 05/11/2011   Hypertension 05/11/2011   Obesity 05/11/2011   Hyperlipidemia 05/11/2011    ONSET DATE: Referred on  09/23/24  REFERRING DIAG: G20.A1 (ICD-10-CM) - Parkinson's disease without dyskinesia, without mention of  fluctuations   THERAPY DIAG:  Dysphagia, unspecified type  Dysarthria and anarthria  Rationale for Evaluation and Treatment: Rehabilitation  SUBJECTIVE:   SUBJECTIVE STATEMENT: I forgot to ask my wife to come.  Pt accompanied by: self  PERTINENT HISTORY: History of nonobstructive CAD, paroxysmal atrial fibrillation, hypertension, hyperlipidemia, type 2 diabetes, Parkinson's disease, OSA   PAIN:  Are you having pain? No  FALLS: Has patient fallen in last 6 months?  No  LIVING ENVIRONMENT: Lives with: lives with their family; [place] Lives in: House/apartment  PLOF:  Level of assistance: Independent with ADLs, Independent with IADLs Employment: Retired; [employer]   PATIENT GOALS: swallowing, voice   OBJECTIVE:  Note: Objective measures were completed at Evaluation unless otherwise noted.  DIAGNOSTIC FINDINGS: Scheduled MBS on 1/15  COGNITION: Overall cognitive status: Impaired: Areas of impairment:  Memory: Impaired: Short term Prospective  Functional deficits: Need to complete cognitive PROM; may have some decreased awareness   MOTOR SPEECH: Overall motor speech: impaired Level of impairment: Sentence and Conversation Respiration: Normal Phonation: normal Resonance: WFL Articulation: Appears intact Intelligibility: Intelligibility reduced Motor planning: Appears intact Intensity: Reduced (see voice)  Effective technique: increased vocal intensity  SOCIAL HISTORY: Occupation: Retired Counsellor intake: suboptimal; 2 bottles Caffeine/alcohol intake: minimal Daily voice use: moderate  PERCEPTUAL VOICE ASSESSMENT: Voice quality: low vocal intensity Vocal abuse: NA Resonance: normal Respiratory function: Normal  OBJECTIVE VOICE ASSESSMENT: Maximum phonation time for sustained ah:  15 s Conversational pitch average: 109 Hz Conversational pitch range: 92-128 Hz Conversational loudness average: 70 dB Conversational loudness range: 59-80 dB S/z ratio: 1.0  (Suggestive of dysfunction >1.0) /s/: 15 s /z/: 15 s  ORAL MOTOR EXAMINATION: Overall status: WFL Comments:   RECOMMENDATIONS FROM OBJECTIVE SWALLOW STUDY (MBSS/FEES):    **EVALUATION SCHEDULED ON 11/27/23    CLINICAL SWALLOW ASSESSMENT:   Current diet: regular and thin liquids Dentition: adequate natural dentition Patient directly observed with POs: Yes: thin liquids  Feeding: able to feed self Liquids provided by: bottle ([facility] Swallow Protocol)  Oral phase signs and symptoms: None noted today Pharyngeal phase signs and symptoms: None noted today Comments: plans for MBS on Jan 15 due to reported symptoms  STANDARDIZED ASSESSMENTS: SLUMS: 26/30  PATIENT REPORTED OUTCOME MEASURES (PROM): EAT-10: 5/40  VHI-10: 7/40                                                                                                                            TREATMENT DATE:   11/19/23: Pt was seen for skilled ST services. Pt called wife in today's session for further discusssion with therapist regarding perceived deficits at home. Wife reports some mild difficulty with memory (the little things) and a softer voice; however, she reports she can hear him fine with/without noise. SLP facilitated pt being scheduled over at [facility] for MBS/tx follow up. At this time, pt does not care to work on voice exercises. SLP initiated education on external memory strategies. To cont next session.   11/11/24: **insurance changing to [employer] 1/1 - Cont same POC. Pt was seen for skilled ST services targeting continued assessment and dysphagia follow up. He reports he is very cognizant of his swallow to not choke.  He reports he over chews his food and eats at a slower rate. He has to make sure his pills are facing straight in his mouth to facilitate easier transition. SLP suggested trying pills in puree to see if this is more comfortable and reduces symptoms. SLP educated on Think Loud and Speak Out/with Intent  for Parkinsons and directed him to [email or web address] for further information on sx associated with PD.    **[facility] is closest - if wanting to transition due to SLP maternity leave.   PATIENT EDUCATION: Education details: PD and related changes Person educated: Patient Education method: Explanation Education comprehension: needs further education   GOALS: Goals reviewed with patient? No  SHORT TERM GOALS: Target date: 12/05/23  Pt will complete voice assessment and goals will be updated per patient's wishes.  Baseline: Goal status: MET  2.  Pt will participate in MBS for further insight into swallowing deficits and goals will be updated to include exercises as warranted.  Baseline:  Goal status: INITIAL  3.  Pt will participate in SPEAK OUT! Or LSVT Loud to improve vocal volume.  Baseline:  Goal status: INITIAL    LONG TERM GOALS: Target date: 01/05/24  Pt will improve score on  PROM Baseline:  Goal status: INITIAL  2.  Pt will report use of external memory strategies  Baseline:  Goal status: INITIAL  3.  Pt will demonstrate improved vocal volume .  Baseline: 70 dB Goal status: INITIAL    ASSESSMENT:  CLINICAL IMPRESSION: Pt is a 69 yo male who presents to ST OP for evaluation for Parkinson's Disease. Pt endorses re: choking with liquids/solids and pills getting stuck and changes in thinking skills (memory). He reports he has always been soft spoken. Pt is responsible for medication management, wife helps with financial management. Pt was assessed using PROMs, SLUMS, and voice measures. SLP observed decreased vocal volume during today's assessment. SLP rec skilled ST services to address cognitive-communication, dysarthria and dysphagia.    OBJECTIVE IMPAIRMENTS: include memory, dysarthria, and dysphagia. These impairments are limiting patient from managing medications, managing appointments, managing finances, household responsibilities, effectively communicating at  home and in community, and safety when swallowing. Factors affecting potential to achieve goals and functional outcome are NA. Patient will benefit from skilled SLP services to address above impairments and improve overall function.  REHAB POTENTIAL: Good  PLAN:  SLP FREQUENCY: 1x/week  SLP DURATION: 8 weeks  PLANNED INTERVENTIONS: Aspiration precaution training, Pharyngeal strengthening exercises, Diet toleration management , Environmental controls, Cueing hierachy, SLP instruction and feedback, Compensatory strategies, Patient/family education, [phone] Treatment of speech (30 or 45 min) , and [id number] Treatment of swallowing function    [employer], CCC-SLP 11/18/2024, 9:25 AM           "

## 2024-11-23 ENCOUNTER — Ambulatory Visit: Admitting: Occupational Therapy

## 2024-11-23 ENCOUNTER — Encounter: Payer: Self-pay | Admitting: Speech Pathology

## 2024-11-23 ENCOUNTER — Ambulatory Visit: Admitting: Speech Pathology

## 2024-11-23 DIAGNOSIS — R1310 Dysphagia, unspecified: Secondary | ICD-10-CM

## 2024-11-23 DIAGNOSIS — R41841 Cognitive communication deficit: Secondary | ICD-10-CM

## 2024-11-23 DIAGNOSIS — R471 Dysarthria and anarthria: Secondary | ICD-10-CM

## 2024-11-23 NOTE — Therapy (Signed)
 " OUTPATIENT SPEECH LANGUAGE PATHOLOGY TREATMENT   Patient Name: Edwin Gallagher MRN: [id number] DOB:11-Mar-1956, 69 y.o., male Today's Date: 11/23/2024  PCP: Valentin Skates, DO REFERRING PROVIDER: Valentin Skates, DO  END OF SESSION:  End of Session - 11/23/24 0826     Visit Number 4    Date for Recertification  01/03/25    SLP Start Time 0820    SLP Stop Time  0845    SLP Time Calculation (min) 25 min    Activity Tolerance Patient tolerated treatment well           Past Medical History:  Diagnosis Date   Abnormal cardiac CT angiography 2006   only abnormal with soft plaque in LAD calcium  score is0    Abnormal LFTs    Balance problem    CAD (coronary artery disease)    a. has seen Dr. Morris in the past and had cardiac CT 2006 with calcium  score of 0. He had soft plaque in LAD of about 50%.  He had a normal stress echo in 2012. He also had a normal nuc in 2016.   Chest discomfort    Diabetes mellitus    Forgetfulness    Hyperlipidemia    Hypertension    Obesity    OSA (obstructive sleep apnea)    Paroxysmal atrial fibrillation (HCC)    Tremor    Past Surgical History:  Procedure Laterality Date   ROTATOR CUFF REPAIR  2009   Right   TRIGGER FINGER RELEASE  02/12/2012   Procedure: MINOR RELEASE TRIGGER FINGER/A-1 PULLEY;  Surgeon: Lamar LULLA Leonor Mickey., MD;  Location: [facility];  Service: Orthopedics;  Laterality: Right;  right thumb   Patient Active Problem List   Diagnosis Date Noted   Parkinsonism (HCC) 01/18/2022   Parkinson's disease (HCC) 02/21/2021   Mild cognitive impairment 02/21/2021   Low back pain 02/21/2021   Bilateral low back pain without sciatica 01/25/2021   Tremor 01/05/2021   Gait abnormality 11/03/2020   Left-sided weakness 11/03/2020   Chronic left shoulder pain 11/03/2020   REM behavioral disorder 08/24/2020   Coagulation defect 07/06/2020   Paroxysmal atrial fibrillation (HCC) 01/06/2019   OSA (obstructive sleep apnea)  09/24/2011   Chest pain 05/11/2011   Hypertension 05/11/2011   Obesity 05/11/2011   Hyperlipidemia 05/11/2011    ONSET DATE: Referred on  09/23/24  REFERRING DIAG: G20.A1 (ICD-10-CM) - Parkinson's disease without dyskinesia, without mention of fluctuations   THERAPY DIAG:  Cognitive communication deficit  Dysarthria and anarthria  Dysphagia, unspecified type  Rationale for Evaluation and Treatment: Rehabilitation  SUBJECTIVE:   SUBJECTIVE STATEMENT: I forgot to ask my wife to come.  Pt accompanied by: self  PERTINENT HISTORY: History of nonobstructive CAD, paroxysmal atrial fibrillation, hypertension, hyperlipidemia, type 2 diabetes, Parkinson's disease, OSA   PAIN:  Are you having pain? No  FALLS: Has patient fallen in last 6 months?  No  LIVING ENVIRONMENT: Lives with: lives with their family; [place] Lives in: House/apartment  PLOF:  Level of assistance: Independent with ADLs, Independent with IADLs Employment: Retired; [employer]   PATIENT GOALS: swallowing, voice   OBJECTIVE:  Note: Objective measures were completed at Evaluation unless otherwise noted.  DIAGNOSTIC FINDINGS: Scheduled MBS on 1/15  COGNITION: Overall cognitive status: Impaired: Areas of impairment:  Memory: Impaired: Short term Prospective  Functional deficits: Need to complete cognitive PROM; may have some decreased awareness   MOTOR SPEECH: Overall motor speech: impaired Level of impairment: Sentence and Conversation Respiration: Normal Phonation: normal  Resonance: WFL Articulation: Appears intact Intelligibility: Intelligibility reduced Motor planning: Appears intact Intensity: Reduced (see voice)  Effective technique: increased vocal intensity  SOCIAL HISTORY: Occupation: Retired Counsellor intake: suboptimal; 2 bottles Caffeine/alcohol intake: minimal Daily voice use: moderate  PERCEPTUAL VOICE ASSESSMENT: Voice quality: low vocal intensity Vocal abuse: NA Resonance:  normal Respiratory function: Normal  OBJECTIVE VOICE ASSESSMENT: Maximum phonation time for sustained ah: 15 s Conversational pitch average: 109 Hz Conversational pitch range: 92-128 Hz Conversational loudness average: 70 dB Conversational loudness range: 59-80 dB S/z ratio: 1.0 (Suggestive of dysfunction >1.0) /s/: 15 s /z/: 15 s  ORAL MOTOR EXAMINATION: Overall status: WFL Comments:   RECOMMENDATIONS FROM OBJECTIVE SWALLOW STUDY (MBSS/FEES):    **EVALUATION SCHEDULED ON 11/27/23    CLINICAL SWALLOW ASSESSMENT:   Current diet: regular and thin liquids Dentition: adequate natural dentition Patient directly observed with POs: Yes: thin liquids  Feeding: able to feed self Liquids provided by: bottle ([facility] Swallow Protocol)  Oral phase signs and symptoms: None noted today Pharyngeal phase signs and symptoms: None noted today Comments: plans for MBS on Jan 15 due to reported symptoms  STANDARDIZED ASSESSMENTS: SLUMS: 26/30  PATIENT REPORTED OUTCOME MEASURES (PROM): EAT-10: 5/40  VHI-10: 7/40                                                                                                                            TREATMENT DATE:   11/24/23: Pt was seen for skilled ST services. Pt showed up for session at the wrong time today. He required additional explanation regarding calendar and had difficulty keeping dates straight when discussing his anniversary trip. SLP assisted pt in organizing and clarifying calendar re: highlighting, writing in days of the week for points of reference. SLP educated on alarms and assisted pt in setting alarms for his medications (8:30, 1:30, 6:30, 8:30). Pt shares that he will snooze and forget to take his medications sometimes. Recommend continued follow up on memory strategies. Pt to follow up with [facility] at next visit. His MBS is on 1/15. No overt s/sx of aspiration noted with water in today's session.   11/19/23: Pt was seen for skilled ST  services. Pt called wife in today's session for further discusssion with therapist regarding perceived deficits at home. Wife reports some mild difficulty with memory (the little things) and a softer voice; however, she reports she can hear him fine with/without noise. SLP facilitated pt being scheduled over at [facility] for MBS/tx follow up. At this time, pt does not care to work on voice exercises. SLP initiated education on external memory strategies re: calendar routine, writing information down (notebook or notes app) and lists. To cont next session.   11/11/24: **insurance changing to [employer] 1/1 - Cont same POC. Pt was seen for skilled ST services targeting continued assessment and dysphagia follow up. He reports he is very cognizant of his swallow to not choke.  He reports he over chews his food and eats at  a slower rate. He has to make sure his pills are facing straight in his mouth to facilitate easier transition. SLP suggested trying pills in puree to see if this is more comfortable and reduces symptoms. SLP educated on Think Loud and Speak Out/with Intent for Parkinsons and directed him to [email or web address] for further information on sx associated with PD.    **[facility] is closest - if wanting to transition due to SLP maternity leave.   PATIENT EDUCATION: Education details: PD and related changes Person educated: Patient Education method: Explanation Education comprehension: needs further education   GOALS: Goals reviewed with patient? No  SHORT TERM GOALS: Target date: 12/05/23  Pt will complete voice assessment and goals will be updated per patient's wishes.  Baseline: Goal status: MET  2.  Pt will participate in MBS for further insight into swallowing deficits and goals will be updated to include exercises as warranted.  Baseline:  Goal status: INITIAL  3.  Pt will participate in SPEAK OUT! Or LSVT Loud to improve vocal volume.  Baseline:  Goal status:  INITIAL    LONG TERM GOALS: Target date: 01/05/24  Pt will improve score on PROM Baseline:  Goal status: INITIAL  2.  Pt will report use of external memory strategies  Baseline:  Goal status: INITIAL  3.  Pt will demonstrate improved vocal volume .  Baseline: 70 dB Goal status: INITIAL    ASSESSMENT:  CLINICAL IMPRESSION: Pt is a 69 yo male who presents to ST OP for evaluation for Parkinson's Disease. Pt endorses re: choking with liquids/solids and pills getting stuck and changes in thinking skills (memory). He reports he has always been soft spoken. Pt is responsible for medication management, wife helps with financial management. Pt was assessed using PROMs, SLUMS, and voice measures. SLP observed decreased vocal volume during today's assessment. SLP rec skilled ST services to address cognitive-communication, dysarthria and dysphagia.    OBJECTIVE IMPAIRMENTS: include memory, dysarthria, and dysphagia. These impairments are limiting patient from managing medications, managing appointments, managing finances, household responsibilities, effectively communicating at home and in community, and safety when swallowing. Factors affecting potential to achieve goals and functional outcome are NA. Patient will benefit from skilled SLP services to address above impairments and improve overall function.  REHAB POTENTIAL: Good  PLAN:  SLP FREQUENCY: 1x/week  SLP DURATION: 8 weeks  PLANNED INTERVENTIONS: Aspiration precaution training, Pharyngeal strengthening exercises, Diet toleration management , Environmental controls, Cueing hierachy, SLP instruction and feedback, Compensatory strategies, Patient/family education, [phone] Treatment of speech (30 or 45 min) , and [id number] Treatment of swallowing function    [employer], CCC-SLP 11/23/2024, 8:26 AM           "

## 2024-11-26 ENCOUNTER — Ambulatory Visit (HOSPITAL_COMMUNITY)
Admission: RE | Admit: 2024-11-26 | Discharge: 2024-11-26 | Disposition: A | Source: Ambulatory Visit | Attending: Internal Medicine

## 2024-11-26 ENCOUNTER — Ambulatory Visit (HOSPITAL_COMMUNITY)
Admission: RE | Admit: 2024-11-26 | Discharge: 2024-11-26 | Disposition: A | Discharge status: Home / Self Care | Source: Ambulatory Visit | Attending: Internal Medicine | Admitting: Internal Medicine

## 2024-11-26 DIAGNOSIS — G20A1 Parkinson's disease without dyskinesia, without mention of fluctuations: Secondary | ICD-10-CM | POA: Diagnosis present

## 2024-11-26 DIAGNOSIS — R059 Cough, unspecified: Secondary | ICD-10-CM | POA: Diagnosis not present

## 2024-11-26 DIAGNOSIS — R1312 Dysphagia, oropharyngeal phase: Secondary | ICD-10-CM | POA: Insufficient documentation

## 2024-11-26 DIAGNOSIS — R1310 Dysphagia, unspecified: Secondary | ICD-10-CM

## 2024-11-26 DIAGNOSIS — M2578 Osteophyte, vertebrae: Secondary | ICD-10-CM | POA: Insufficient documentation

## 2024-11-26 NOTE — Therapy (Incomplete)
 "  OUTPATIENT OCCUPATIONAL THERAPY PARKINSON'S treatment  Patient Name: Edwin Gallagher MRN: [id number] DOB:30-Jan-1956, 69 y.o., male Today's Date: 11/26/2024  PCP: Valentin Skates DO REFERRING PROVIDER: Valentin Skates DO  END OF SESSION:    Past Medical History:  Diagnosis Date   Abnormal cardiac CT angiography 2006   only abnormal with soft plaque in LAD calcium  score is0    Abnormal LFTs    Balance problem    CAD (coronary artery disease)    a. has seen Dr. Morris in the past and had cardiac CT 2006 with calcium  score of 0. He had soft plaque in LAD of about 50%.  He had a normal stress echo in 2012. He also had a normal nuc in 2016.   Chest discomfort    Diabetes mellitus    Forgetfulness    Hyperlipidemia    Hypertension    Obesity    OSA (obstructive sleep apnea)    Paroxysmal atrial fibrillation (HCC)    Tremor    Past Surgical History:  Procedure Laterality Date   ROTATOR CUFF REPAIR  2009   Right   TRIGGER FINGER RELEASE  02/12/2012   Procedure: MINOR RELEASE TRIGGER FINGER/A-1 PULLEY;  Surgeon: Lamar LULLA Leonor Mickey., MD;  Location: [facility];  Service: Orthopedics;  Laterality: Right;  right thumb   Patient Active Problem List   Diagnosis Date Noted   Parkinsonism (HCC) 01/18/2022   Parkinson's disease (HCC) 02/21/2021   Mild cognitive impairment 02/21/2021   Low back pain 02/21/2021   Bilateral low back pain without sciatica 01/25/2021   Tremor 01/05/2021   Gait abnormality 11/03/2020   Left-sided weakness 11/03/2020   Chronic left shoulder pain 11/03/2020   REM behavioral disorder 08/24/2020   Coagulation defect 07/06/2020   Paroxysmal atrial fibrillation (HCC) 01/06/2019   OSA (obstructive sleep apnea) 09/24/2011   Chest pain 05/11/2011   Hypertension 05/11/2011   Obesity 05/11/2011   Hyperlipidemia 05/11/2011    ONSET DATE: 09/23/24  REFERRING DIAG: Parkinson's disease  THERAPY DIAG:  No diagnosis found.  Rationale for Evaluation  and Treatment: Rehabilitation  SUBJECTIVE:   SUBJECTIVE STATEMENT: *** No reports of pain  Pt accompanied by: self  PERTINENT HISTORY: Parkinson's s disease diagnosed 3 years ago per pt.  History of nonobstructive CAD, paroxysmal atrial fibrillation, hypertension, hyperlipidemia, type 2 diabetes,  OSA   PRECAUTIONS: Fall, swallowing issues, has MBS scheduled  WEIGHT BEARING RESTRICTIONS: No  PAIN:  Are you having pain? No  FALLS: Has patient fallen in last 6 months? No  LIVING ENVIRONMENT: Lives with: lives with their family Lives in: House/apartment Stairs: internal   PLOF: Independent  PATIENT GOALS: maintain independence   OBJECTIVE:  Note: Objective measures were completed at Evaluation unless otherwise noted.  HAND DOMINANCE: Right  ADLs: Overall ADLs: increased time Transfers/ambulation related to ADLs:mod I Eating: mod I, swallowing issues Grooming: mod I UB Dressing: mod I LB Dressing: difficulty with shoes and socks Toileting: mod I  Bathing: mod I Tub Shower transfers:mod I- walk in shower  IADLs: Shopping: mod I Light housekeeping: light cleaning only Meal Prep: minimal cooking only Community mobility: mod I Medication management: pt handles own meds Financial management: pt/ wife handle together Handwriting: 100% legible  MOBILITY STATUS: Independent   FUNCTIONAL OUTCOME MEASURES: Fastening/unfastening 3 buttons: 33.50 secs Physical performance test: PPT#2 (simulated eating) NT & PPT#4 (donning/doffing jacket): 17.13  COORDINATION: 9 Hole Peg test: Right: 28.39 sec; Left: 32.70 sec Box and Blocks:  Right 47blocks, Left 45 blocks  Tremors: Resting for LUE  UE ROM:  Shoulder flexion RUE 110-hx of RC repair, LUE 115, elbow extension: RUE -30, LUE -30, RUE decreased wrist ext 40  SENSATION: Not tested  MUSCLE TONE: LUE: Mild and Rigidity  COGNITION: Overall cognitive status: not formally tested, will further assess in a functional  context                                                                                                                     TREATMENT DATE:   ***:  ***  11/16/24- UBE x 6 mins level 1 for conditioning, min v.c to maintain 40 rpm, Pt was instructed in PWR! basic 4 seated 10 reps each, PWR! hands basic 4 5-10 reps each, demonstration, mod v.c/ facilitation for amplitude and postioning Flipping and dealing playing cards with big movments for increased fine motor coordiantion, mod v.c and demonstration  11/09/24 eval   PATIENT EDUCATION: Education details: *** PWR! basic 4 seated 10 reps each, PWR! hands basic 4 5-10 reps each, demonstration, mod v.c/ facilitation for amplitude and postioning Flipping and dealing playing cards with big movments for increased fine motor coordiantion, mod v.c and demonstration Person educated: Patient Education method: Explanation, demonstration, verbal and tactile cues, handout Education comprehension: verbalized understanding, retruend demonstration, will benefit from further review  HOME EXERCISE PROGRAM: 11/16/24- PWR! seated, PWR! hands, flipping/ dealing cards  GOALS: Goals reviewed with patient? Yes  SHORT TERM GOALS: Target date: 12/10/24  I with PD specific HEP Goal status: ongoing issued 11/16/24  2.  I with adapted strategies to maximize pt's safety and I with ADLs/I ADLs Goal status: INITIAL  3.  Pt will demontrate improved ease with dressing as eviednced by decreasing PPT# 4 to 15 secs or less. Goal status: INITIAL  4.  Pt will demonstrate improved bilateral UE functional use as evidenced by decreasing 3 button/ unbutton by 3 secs. Goal status: INITIAL   LONG TERM GOALS: Target date: 01/04/25  Pt will demonstrate improved bilateral functional use as evidenced by decreasing 3 button/ unbutton score by 3 secs. Goal status: INITIAL  2.  Pt will demonstrate improved bilateral UE functional use as evidenced by increasing bilateral box/  blocks score by 3 blocks Goal status: INITIAL  3.  Pt will increase functional reach with LUE to 120 shoulder flexion with -25 elbow extension for increased functional use. Goal status: INITIAL  4.  Pt. will demonstrate ability to retrieve a lightweight object at 115 shoulder lexion and -25 elbow extnesion with RUE Goal status: INITIAL  5.Pt will verbalize understanding of ways to prevent future PD related complications and PD community resources. Goal status: INITIAL  ASSESSMENT:  CLINICAL IMPRESSION: *** Patient is progressing towards goals. He demonstrates understanding of inital HEP however he can benefit from review for amplitude and positioning.  PERFORMANCE DEFICITS: in functional skills including ADLs, IADLs, coordination, dexterity, tone, ROM, strength, flexibility, Fine motor control, Gross motor control, mobility, balance, endurance, decreased knowledge of precautions, decreased knowledge of use of DME,  and UE functional use, mild cognitive deficits and psychosocial skills including coping strategies, environmental adaptation, habits, interpersonal interactions, and routines and behaviors.   IMPAIRMENTS: are limiting patient from ADLs, IADLs, play, leisure, and social participation.   COMORBIDITIES:  may have co-morbidities  that affects occupational performance. Patient will benefit from skilled OT to address above impairments and improve overall function.  MODIFICATION OR ASSISTANCE TO COMPLETE EVALUATION: No modification of tasks or assist necessary to complete an evaluation.  OT OCCUPATIONAL PROFILE AND HISTORY: Detailed assessment: Review of records and additional review of physical, cognitive, psychosocial history related to current functional performance.  CLINICAL DECISION MAKING: LOW - limited treatment options, no task modification necessary  REHAB POTENTIAL: Good  EVALUATION COMPLEXITY: Low   PLAN:  OT FREQUENCY: 1-2x/week  OT DURATION: 12 weeks  PLANNED  INTERVENTIONS: 97168 OT Re-evaluation, 97535 self care/ADL training, 02889 therapeutic exercise, 97530 therapeutic activity, 97112 neuromuscular re-education, 97140 manual therapy, 97035 ultrasound, 97018 paraffin, 02989 moist heat, 97129 Cognitive training (first 15 min), 02869 Cognitive training(each additional 15 min), passive range of motion, balance training, functional mobility training, energy conservation, coping strategies training, patient/family education, and DME and/or AE instructions  RECOMMENDED OTHER SERVICES: n/a  CONSULTED AND AGREED WITH PLAN OF CARE: Patient  PLAN FOR NEXT SESSION: *** review HEP, ADL strategies   Yvonda Fouty, OTR/L 11/26/2024, 5:38 PM   "

## 2024-11-27 ENCOUNTER — Ambulatory Visit: Admitting: Physical Therapy

## 2024-11-27 ENCOUNTER — Ambulatory Visit: Admitting: Occupational Therapy

## 2024-11-27 NOTE — Progress Notes (Signed)
 Modified Barium Swallow Study  Patient Details  Name: Edwin Gallagher MRN: [id number] Date of Birth: 06-17-1956  Today's Date: 11/27/2024  Modified Barium Swallow completed.  Full report located under Chart Review in the Imaging Section.  History of Present Illness Pt is a 69 year old male arriving for an OP MBS, currently in OP SLP therapy targeting motor speech and cognition secondary to Parkinsons's disease. He has reported some difficulty swallowing and MBS recommended. Pt reports occasional coughing, not all the time. Has to take meals very slowly, chew carefully so he doesnt choke.   Clinical Impression Pt demonstrates mild oropharyngeal dysphagia with no vestibular or airway invasion on this study. No evidence of weakness or decreased ROM. Pt does initiate swallow at the pyriform sinuses with liquids despite adequate oral containment. Additionally pt has appearance of osteophyte at C5-7 that restricted passage of bolus through the PES with a pill. Pt able to pass pills with consecutive sips of thin liquids. Suspect there may be instances of solid foods and liquids hesitating at PES due to obstructed opening and mild timing impairment that could elicit coughing at times. Pills in puree with be helpful. Oral hold and slow rate with thins also helpful. Pt recommended to f/u with OP SLP to reinforce results and strategies.   Swallow Evaluation Recommendations Recommendations: PO diet PO Diet Recommendation: Regular;Thin liquids (Level 0) Liquid Administration via: Cup;Straw Medication Administration: Whole meds with puree Supervision: Patient able to self-feed Swallowing strategies  : Slow rate;Small bites/sips (oral hold)   Consuelo [place], MA CCC-SLP  Acute Rehabilitation Services Secure Chat Preferred Office [phone]  [place] 11/27/2024,9:15 AM

## 2024-11-30 ENCOUNTER — Ambulatory Visit: Admitting: Occupational Therapy

## 2024-11-30 ENCOUNTER — Ambulatory Visit: Admitting: Physical Therapy

## 2024-11-30 NOTE — Therapy (Unsigned)
 "  OUTPATIENT OCCUPATIONAL THERAPY PARKINSON'S treatment  Patient Name: Edwin Gallagher MRN: [id number] DOB:08-Oct-1956, 69 y.o., male Today's Date: 11/30/2024  PCP: Valentin Skates DO REFERRING PROVIDER: Valentin Skates DO  END OF SESSION:    Past Medical History:  Diagnosis Date   Abnormal cardiac CT angiography 2006   only abnormal with soft plaque in LAD calcium  score is0    Abnormal LFTs    Balance problem    CAD (coronary artery disease)    a. has seen Dr. Morris in the past and had cardiac CT 2006 with calcium  score of 0. He had soft plaque in LAD of about 50%.  He had a normal stress echo in 2012. He also had a normal nuc in 2016.   Chest discomfort    Diabetes mellitus    Forgetfulness    Hyperlipidemia    Hypertension    Obesity    OSA (obstructive sleep apnea)    Paroxysmal atrial fibrillation (HCC)    Tremor    Past Surgical History:  Procedure Laterality Date   ROTATOR CUFF REPAIR  2009   Right   TRIGGER FINGER RELEASE  02/12/2012   Procedure: MINOR RELEASE TRIGGER FINGER/A-1 PULLEY;  Surgeon: Lamar LULLA Leonor Mickey., MD;  Location: [facility];  Service: Orthopedics;  Laterality: Right;  right thumb   Patient Active Problem List   Diagnosis Date Noted   Parkinsonism (HCC) 01/18/2022   Parkinson's disease (HCC) 02/21/2021   Mild cognitive impairment 02/21/2021   Low back pain 02/21/2021   Bilateral low back pain without sciatica 01/25/2021   Tremor 01/05/2021   Gait abnormality 11/03/2020   Left-sided weakness 11/03/2020   Chronic left shoulder pain 11/03/2020   REM behavioral disorder 08/24/2020   Coagulation defect 07/06/2020   Paroxysmal atrial fibrillation (HCC) 01/06/2019   OSA (obstructive sleep apnea) 09/24/2011   Chest pain 05/11/2011   Hypertension 05/11/2011   Obesity 05/11/2011   Hyperlipidemia 05/11/2011    ONSET DATE: 09/23/24  REFERRING DIAG: Parkinson's disease  THERAPY DIAG:  No diagnosis found.  Rationale for Evaluation  and Treatment: Rehabilitation  SUBJECTIVE:   SUBJECTIVE STATEMENT: No reports of pain Pt accompanied by: self  PERTINENT HISTORY: Parkinson's sdisease diagnosed 3 years ago per pt.History of nonobstructive CAD, paroxysmal atrial fibrillation, hypertension, hyperlipidemia, type 2 diabetes,  OSA  PRECAUTIONS: Fall, swallowing issues, has MBS scheduled  WEIGHT BEARING RESTRICTIONS: No  PAIN:  Are you having pain? No  FALLS: Has patient fallen in last 6 months? No  LIVING ENVIRONMENT: Lives with: lives with their family Lives in: House/apartment Stairs: internal   PLOF: Independent  PATIENT GOALS: maintain independence   OBJECTIVE:  Note: Objective measures were completed at Evaluation unless otherwise noted.  HAND DOMINANCE: Right  ADLs: Overall ADLs: increased time Transfers/ambulation related to ADLs:mod I Eating: mod I, swallowing issues Grooming: mod I UB Dressing: mod I LB Dressing: difficulty with shoes and socks Toileting: mod I  Bathing: mod I Tub Shower transfers:mod I- walk in shower  IADLs: Shopping: mod I Light housekeeping: light cleaning only Meal Prep: minimal cooking only Community mobility: mod I Medication management: pt handles own meds Financial management: pt/ wife handle together Handwriting: 100% legible  MOBILITY STATUS: Independent   FUNCTIONAL OUTCOME MEASURES: Fastening/unfastening 3 buttons: 33.50 secs Physical performance test: PPT#2 (simulated eating) NT & PPT#4 (donning/doffing jacket): 17.13  COORDINATION: 9 Hole Peg test: Right: 28.39 sec; Left: 32.70 sec Box and Blocks:  Right 47blocks, Left 45 blocks Tremors: Resting for LUE  UE  ROM:  Shoulder flexion RUE 110-hx of RC repair, LUE 115, elbow extension: RUE -30, LUE -30, RUE decreased wrist ext 40    SENSATION: Not tested  MUSCLE TONE: LUE: Mild and Rigidity  COGNITION: Overall cognitive status: not formally tested, will further assess in a functional  context                                                                                                                     TREATMENT DATE: 11/16/24- UBE x 6 mins level 1 for conditioning, min v.c to maintain 40 rpm, Pt was instructed in PWR! basic 4 seated 10 reps each, PWR! hands basic 4 5-10 reps each, demonstration, mod v.c/ facilitation for amplitude and postioning Flipping and dealing playing cards with big movments for increased fine motor coordiantion, mod v.c and demonstration   11/09/24 eval   PATIENT EDUCATION: Education details: PWR! basic 4 seated 10 reps each, PWR! hands basic 4 5-10 reps each, demonstration, mod v.c/ facilitation for amplitude and postioning Flipping and dealing playing cards with big movments for increased fine motor coordiantion, mod v.c and demonstration Person educated: Patient Education method: Explanation, demonstration, verbal and tactile cues, handout Education comprehension: verbalized understanding, retruend demonstration, will benefit from further review  HOME EXERCISE PROGRAM: 11/16/24- PWR! seated, PWR! hands, flipping/ dealing cards  GOALS: Goals reviewed with patient? Yes  SHORT TERM GOALS: Target date: 12/10/24  I with PD specific HEP Baseline: Goal status: ongoing issued 11/16/24  2.  I with adapted strategies to maximize pt's safety and I with ADLs/I ADLs Baseline:  Goal status: INITIAL  3.  Pt will demontrate improved ease with dressing as eviednced by decreasing PPT# 4 to 15 secs or less. Baseline:  Goal status: INITIAL  4.  Pt will demonstrate improved bilateral UE functional use as evidenced by decreasing 3 button/ unbutton by 3 secs. Baseline:  Goal status: INITIAL   LONG TERM GOALS: Target date: 01/04/25 Pt will demonstrate improved bilateral functional use as evidenced by decreasing 3 button/ unbutton score by 3 secs. Baseline:  Goal status: INITIAL  2.  Pt will demonstrate improved bilateral UE functional use as  evidenced by increasing bilateral box/ blocks score by 3 blocks Baseline:  Goal status: INITIAL  3.  Pt will increase functional reach with LUE to 120 shoulder flexion with -25 elbow extension for increased functional use. Baseline:  Goal status: INITIAL  4.  Pt. will demonstrate ability to retrieve a lightweight object at 115 shoulder lexion and -25 elbow extnesion with RUE Baseline:  Goal status: INITIAL  5.Pt will verbalize understanding of ways to prevent future PD related complications and PD community resources. Baseline:  Goal status: INITIAL  ASSESSMENT:  CLINICAL IMPRESSION: Patient is progressing towards goals. He demonstrates understanding of inital HEP however he can benefit from review for amplitude and positioning. PERFORMANCE DEFICITS: in functional skills including ADLs, IADLs, coordination, dexterity, tone, ROM, strength, flexibility, Fine motor control, Gross motor control, mobility, balance, endurance, decreased knowledge of precautions, decreased knowledge  of use of DME, and UE functional use, mild cognitive deficits and psychosocial skills including coping strategies, environmental adaptation, habits, interpersonal interactions, and routines and behaviors.   IMPAIRMENTS: are limiting patient from ADLs, IADLs, play, leisure, and social participation.   COMORBIDITIES:  may have co-morbidities  that affects occupational performance. Patient will benefit from skilled OT to address above impairments and improve overall function.  MODIFICATION OR ASSISTANCE TO COMPLETE EVALUATION: No modification of tasks or assist necessary to complete an evaluation.  OT OCCUPATIONAL PROFILE AND HISTORY: Detailed assessment: Review of records and additional review of physical, cognitive, psychosocial history related to current functional performance.  CLINICAL DECISION MAKING: LOW - limited treatment options, no task modification necessary  REHAB POTENTIAL: Good  EVALUATION  COMPLEXITY: Low    PLAN:  OT FREQUENCY: 1-2x/week  OT DURATION: 12 weeks  PLANNED INTERVENTIONS: 97168 OT Re-evaluation, 97535 self care/ADL training, 02889 therapeutic exercise, 97530 therapeutic activity, 97112 neuromuscular re-education, 97140 manual therapy, 97035 ultrasound, 97018 paraffin, 02989 moist heat, 97129 Cognitive training (first 15 min), 02869 Cognitive training(each additional 15 min), passive range of motion, balance training, functional mobility training, energy conservation, coping strategies training, patient/family education, and DME and/or AE instructions  RECOMMENDED OTHER SERVICES: n/a  CONSULTED AND AGREED WITH PLAN OF CARE: Patient  PLAN FOR NEXT SESSION: review HEP, ADL strategies   Divina Neale, OT 11/30/2024, 8:41 AM   "

## 2024-12-03 ENCOUNTER — Ambulatory Visit

## 2024-12-04 ENCOUNTER — Ambulatory Visit

## 2024-12-04 DIAGNOSIS — M6281 Muscle weakness (generalized): Secondary | ICD-10-CM

## 2024-12-04 DIAGNOSIS — M25622 Stiffness of left elbow, not elsewhere classified: Secondary | ICD-10-CM

## 2024-12-04 DIAGNOSIS — R2681 Unsteadiness on feet: Secondary | ICD-10-CM

## 2024-12-04 DIAGNOSIS — M25612 Stiffness of left shoulder, not elsewhere classified: Secondary | ICD-10-CM

## 2024-12-04 DIAGNOSIS — R471 Dysarthria and anarthria: Secondary | ICD-10-CM

## 2024-12-04 DIAGNOSIS — R1310 Dysphagia, unspecified: Secondary | ICD-10-CM

## 2024-12-04 DIAGNOSIS — R41841 Cognitive communication deficit: Secondary | ICD-10-CM

## 2024-12-04 DIAGNOSIS — M25611 Stiffness of right shoulder, not elsewhere classified: Secondary | ICD-10-CM

## 2024-12-04 DIAGNOSIS — R262 Difficulty in walking, not elsewhere classified: Secondary | ICD-10-CM

## 2024-12-04 DIAGNOSIS — R29818 Other symptoms and signs involving the nervous system: Secondary | ICD-10-CM

## 2024-12-04 DIAGNOSIS — R278 Other lack of coordination: Secondary | ICD-10-CM

## 2024-12-04 DIAGNOSIS — M25621 Stiffness of right elbow, not elsewhere classified: Secondary | ICD-10-CM

## 2024-12-04 NOTE — Therapy (Signed)
 " OUTPATIENT PHYSICAL THERAPY NEURO TREATMENT   Patient Name: Edwin Gallagher MRN: [id number] DOB:Jun 09, 1956, 69 y.o., male Today's Date: 12/04/2024   PCP: Valentin HAS REFERRING PROVIDER: Valentin, DO  END OF SESSION:  PT End of Session - 12/04/24 1010     Visit Number 5    Date for Recertification  01/13/25    Authorization Type [employer]na medicare    PT Start Time 0930    PT Stop Time 1012    PT Time Calculation (min) 42 min    Activity Tolerance Patient tolerated treatment well    Behavior During Therapy [facility] for tasks assessed/performed         Past Medical History:  Diagnosis Date   Abnormal cardiac CT angiography 2006   only abnormal with soft plaque in LAD calcium  score is0    Abnormal LFTs    Balance problem    CAD (coronary artery disease)    a. has seen Dr. Morris in the past and had cardiac CT 2006 with calcium  score of 0. He had soft plaque in LAD of about 50%.  He had a normal stress echo in 2012. He also had a normal nuc in 2016.   Chest discomfort    Diabetes mellitus    Forgetfulness    Hyperlipidemia    Hypertension    Obesity    OSA (obstructive sleep apnea)    Paroxysmal atrial fibrillation (HCC)    Tremor    Past Surgical History:  Procedure Laterality Date   ROTATOR CUFF REPAIR  2009   Right   TRIGGER FINGER RELEASE  02/12/2012   Procedure: MINOR RELEASE TRIGGER FINGER/A-1 PULLEY;  Surgeon: Lamar LULLA Leonor Mickey., MD;  Location: [facility];  Service: Orthopedics;  Laterality: Right;  right thumb   Patient Active Problem List   Diagnosis Date Noted   Parkinsonism (HCC) 01/18/2022   Parkinson's disease (HCC) 02/21/2021   Mild cognitive impairment 02/21/2021   Low back pain 02/21/2021   Bilateral low back pain without sciatica 01/25/2021   Tremor 01/05/2021   Gait abnormality 11/03/2020   Left-sided weakness 11/03/2020   Chronic left shoulder pain 11/03/2020   REM behavioral disorder 08/24/2020   Coagulation defect 07/06/2020   Paroxysmal  atrial fibrillation (HCC) 01/06/2019   OSA (obstructive sleep apnea) 09/24/2011   Chest pain 05/11/2011   Hypertension 05/11/2011   Obesity 05/11/2011   Hyperlipidemia 05/11/2011    ONSET DATE: 09/14/24  REFERRING DIAG: Parkinson's, difficulty walking  THERAPY DIAG:  Muscle weakness (generalized)  Difficulty in walking, not elsewhere classified  Other lack of coordination  Unsteady gait  Other symptoms and signs involving the nervous system  Cognitive communication deficit  Stiffness of left shoulder, not elsewhere classified  Stiffness of left elbow, not elsewhere classified  Stiffness of right elbow, not elsewhere classified  Stiffness of right shoulder, not elsewhere classified  Rationale for Evaluation and Treatment: Rehabilitation  SUBJECTIVE:  SUBJECTIVE STATEMENT: Pt reported no recent falls and says overall he feels okay today. He reports he feels like his balance has been a little better and wants to continue working on strength.   Pt accompanied by: self  PERTINENT HISTORY: CAD, DM, HTN, A fib  PAIN:  Are you having pain? No pain now but reports occasional back pain  PRECAUTIONS: None  RED FLAGS: None   WEIGHT BEARING RESTRICTIONS: No  FALLS: Has patient fallen in last 6 months? No  LIVING ENVIRONMENT: Lives with: lives with their family Lives in: House/apartment Stairs: Yes: Internal: 16 steps; bilateral but cannot reach both Has following equipment at home: Single point cane  PLOF: Independent and does some house and yard work, [employer] of [employer] goes less than one time a week  PATIENT GOALS: have better balance maybe do more at [employer]  OBJECTIVE:  Note: Objective measures were completed at Evaluation unless otherwise noted.  DIAGNOSTIC FINDINGS:  none  COGNITION: Overall cognitive status: Within functional limits for tasks assessed   SENSATION: WFL  MUSCLE LENGTH: Very tight HS 45 degree SLR   POSTURE: rounded shoulders and forward head  LOWER EXTREMITY ROM:     Active  Right Eval Left Eval  Hip flexion 70 70  Hip extension    Hip abduction 10 10  Hip adduction    Hip internal rotation    Hip external rotation    Knee flexion    Knee extension 10 10  Ankle dorsiflexion    Ankle plantarflexion    Ankle inversion    Ankle eversion     (Blank rows = not tested)  LOWER EXTREMITY MMT:    MMT Right Eval Left Eval  Hip flexion 4 4  Hip extension 4- 4-  Hip abduction 4- 4-  Hip adduction    Hip internal rotation    Hip external rotation    Knee flexion 4- 4-  Knee extension 4- 4-  Ankle dorsiflexion    Ankle plantarflexion    Ankle inversion    Ankle eversion    (Blank rows = not tested)  STAIRS: Uses hand rail, can do step over step but is slow, caught toe GAIT: Findings: no device, minor limp on the right, has significant pronation at the feet L>R, knees slightly flexed, has a tremor in the left hand, shuffles feet  FUNCTIONAL TESTS:  5 times sit to stand: 27 seconds Timed up and go (TUG): 18 seconds BERG 43/56                                                                                                                             TREATMENT DATE:  12/04/24 Reassess BERG Reassess SL stance Reassess Strength  STS w/ OHP blue weighted ball 2x10  Resisted Gait forward/backward/lateral 10# Glute Bridges 2x5 HS stretch Knee to chest stretch  NuStep L5 x      11/16/24: Nustep L 5 x 6 min In ll  bars, with 1 1/2# cuff wts ankles, for high marching, using height of bars as target to achieve large amplitude movement In ll bars for lateral step over airex balance beam with 1 1/2# cuff wts ankles In ll bars post steps over airex balance beam  On hi low table in prone for post stabilization of  prox femur, prone hip and knee flexion stretch to open ant hip tissues Supine stretch each hamstring therapist providing overpressure and pt using yoga strap to assist , mutiple reps each leg Standing on wedge for B ankle dorsiflexion stretch frequent cues to extend hips  knee flexion extension on physioball x15 BLE Lateral step ups onto 4 step, 10 x each leg, light touch hands for support, frequent cues for achieving upright posture  11/09/24 -NuStep x7 mins level 5 resistance seat 11, UE 11 -seated physioball press x30 reps, 3 sec hold -knee flexion extension on physioball x15 BLE -3 way hip in standing 2x10 BLE with HHA in // bars -Standing PNF D1 LE pattern with HHA in // bars, 2x10 BLE alternating between reps. - step ups x15 BLE lead at 6 inch step with HHA   10/19/24: pt used both hands for support for all of the below activities:  In ll bars for high marches, trying to match height of bar with each leg 15x In ll bars for heel toe rocks on airex 15x In ll bars, lateral travelling over airex, 15x In ll bars for four square, one block stepping clockwise and counterclockwise 5 reps each direction  Nustep L 6 x 6 min Supine for hamstring stretch, pt using yoga strap to assist, therapist providing manual overpressure to isolate posterior knee, 2 reps each side 45 sec each Prone for post prox femur stabilization, for manual quads/hip flexor stretch 45 sec holds, 2 x each leg  Sit to stand with red mediball punches, B feet on airex, 10 x from hi/low table  Standing marching on airex Standing on incline board for B plantarflexor stretch, 30 sec holds, 3 x Tandem standing, 25 sec each leg   10/15/24 Evaluation and HEP gave information regarding inserts to help support foot   PATIENT EDUCATION: Education details: POC/HEP Person educated: Patient Education method: Programmer, Multimedia, Facilities Manager, Verbal cues, and Handouts Education comprehension: verbalized understanding  HOME EXERCISE  PROGRAM: Access Code: [email or web address] URL: [email or web address] Date: 10/15/2024 Prepared by: Ozell Mainland  Exercises - Single Leg Stance with Support  - 2 x daily - 7 x weekly - 1 sets - 10 reps - 10 hold - Standing Tandem Balance with Counter Support  - 2 x daily - 7 x weekly - 1 sets - 10 reps - 10 hold - Single Leg Balance with Clock Reach  - 2 x daily - 7 x weekly - 1 sets - 10 reps - 3 hold - Romberg Stance with Head Nods  - 2 x daily - 7 x weekly - 1 sets - 10 reps - 10 hold  GOALS: Goals reviewed with patient? Yes  SHORT TERM GOALS: Target date: 11/08/24  Independent with initial HEP Baseline: Goal status: IN PROGRESS 12/04/24  LONG TERM GOALS: Target date: 01/14/24  Independent with advanced HEP/gym Baseline:  Goal status: IN PROGRESS 12/04/24  2.  Decrease TUG time to 12 seconds Baseline: 18 seconds Goal status: MET 12s 12/04/24  3.  Improve berg balance score to 48/56 Baseline: 43/56 Goal status: IN PROGRESS 45/56 12/04/24  4.  Be able to stand SLS x 10 seconds on both feet Baseline:  Goal status: IN PROGRESS 4s bilaterally today 12/04/24  5.  Improve LE strength to 4+/5 Baseline:  Goal status: IN PROGRESS 4-/5  in all planes at hip and knee joint today 12/04/24  ASSESSMENT:  CLINICAL IMPRESSION: Patient returns to PT after 2 weeks. Pt exhibits decreased strength in BLE; in standing pt presents with decreased hip extension and increased hyperextension in BLE. He is flexed at the hip in standing. Pt able to correct posturing with verbal cues but requires frequent reminders. Pt presented with some low back stiffness today so session focused on functional movement patterns and stretching.     Patient is a 69 y.o. male who participated today in physical therapy treatment for Parkinson's, weakness, and balance issues. Today we incorporated activities to progress coordination, strength, stability, and endurance through functional movement patterns.  Encouraged to get in prone at home and work on achieving a less flexed posture. He continues to need education and encouragement to address his changes and movement patterns related to his Parkinson's diagnosis.  OBJECTIVE IMPAIRMENTS: Abnormal gait, cardiopulmonary status limiting activity, decreased activity tolerance, decreased balance, decreased coordination, decreased endurance, decreased mobility, difficulty walking, decreased ROM, decreased strength, increased muscle spasms, impaired flexibility, improper body mechanics, postural dysfunction, and pain.   REHAB POTENTIAL: Good  CLINICAL DECISION MAKING: Stable/uncomplicated  EVALUATION COMPLEXITY: Low  PLAN:  PT FREQUENCY: 1x/week  PT DURATION: 12 weeks  PLANNED INTERVENTIONS: 97164- PT Re-evaluation, 97110-Therapeutic exercises, 97530- Therapeutic activity, 97112- Neuromuscular re-education, 97535- Self Care, 02859- Manual therapy, [phone]- Gait training, Patient/Family education, Balance training, Stair training, and Taping  PLAN FOR NEXT SESSION: work on balance, strength and gait   [employer], Student-PT 12/04/2024, 10:12 AM        "

## 2024-12-04 NOTE — Therapy (Signed)
 " OUTPATIENT SPEECH LANGUAGE PATHOLOGY TREATMENT   Patient Name: Edwin Gallagher MRN: [id number] DOB:1956/06/19, 69 y.o., male Today's Date: 12/04/2024  PCP: Valentin Skates, DO REFERRING PROVIDER: Valentin Skates, DO  END OF SESSION:  End of Session - 12/04/24 0907     Visit Number 5    Number of Visits 9    SLP Start Time 0856   arrived at 0849 and in restroom until 0855   SLP Stop Time  0930    SLP Time Calculation (min) 34 min    Activity Tolerance Patient tolerated treatment well            Past Medical History:  Diagnosis Date   Abnormal cardiac CT angiography 2006   only abnormal with soft plaque in LAD calcium  score is0    Abnormal LFTs    Balance problem    CAD (coronary artery disease)    a. has seen Dr. Morris in the past and had cardiac CT 2006 with calcium  score of 0. He had soft plaque in LAD of about 50%.  He had a normal stress echo in 2012. He also had a normal nuc in 2016.   Chest discomfort    Diabetes mellitus    Forgetfulness    Hyperlipidemia    Hypertension    Obesity    OSA (obstructive sleep apnea)    Paroxysmal atrial fibrillation (HCC)    Tremor    Past Surgical History:  Procedure Laterality Date   ROTATOR CUFF REPAIR  2009   Right   TRIGGER FINGER RELEASE  02/12/2012   Procedure: MINOR RELEASE TRIGGER FINGER/A-1 PULLEY;  Surgeon: Lamar LULLA Leonor Mickey., MD;  Location: [facility];  Service: Orthopedics;  Laterality: Right;  right thumb   Patient Active Problem List   Diagnosis Date Noted   Parkinsonism (HCC) 01/18/2022   Parkinson's disease (HCC) 02/21/2021   Mild cognitive impairment 02/21/2021   Low back pain 02/21/2021   Bilateral low back pain without sciatica 01/25/2021   Tremor 01/05/2021   Gait abnormality 11/03/2020   Left-sided weakness 11/03/2020   Chronic left shoulder pain 11/03/2020   REM behavioral disorder 08/24/2020   Coagulation defect 07/06/2020   Paroxysmal atrial fibrillation (HCC) 01/06/2019   OSA  (obstructive sleep apnea) 09/24/2011   Chest pain 05/11/2011   Hypertension 05/11/2011   Obesity 05/11/2011   Hyperlipidemia 05/11/2011    ONSET DATE: Referred on  09/23/24  REFERRING DIAG: G20.A1 (ICD-10-CM) - Parkinson's disease without dyskinesia, without mention of fluctuations   THERAPY DIAG:  Dysarthria and anarthria  Cognitive communication deficit  Dysphagia, unspecified type  Rationale for Evaluation and Treatment: Rehabilitation  SUBJECTIVE:   SUBJECTIVE STATEMENT: She recommended I take my pills with applesauce or yogurt.  Pt accompanied by: self  PERTINENT HISTORY: History of nonobstructive CAD, paroxysmal atrial fibrillation, hypertension, hyperlipidemia, type 2 diabetes, Parkinson's disease, OSA   PAIN:  Are you having pain? No  FALLS: Has patient fallen in last 6 months?  No  LIVING ENVIRONMENT: Lives with: lives with their family; Len[place] Lives in: House/apartment  PLOF:  Level of assistance: Independent with ADLs, Independent with IADLs Employment: Retired; [employer]   PATIENT GOALS: swallowing, voice   OBJECTIVE:  Note: Objective measures were completed at Evaluation unless otherwise noted.  COGNITION: Overall cognitive status: Impaired: Areas of impairment:  Memory: Impaired: Short term Prospective  Functional deficits: Need to complete cognitive PROM; may have some decreased awareness   MOTOR SPEECH: Overall motor speech: impaired Level of impairment: Actor and [employer]  Respiration: Normal Phonation: normal Resonance: WFL Articulation: Appears intact Intelligibility: Intelligibility reduced Motor planning: Appears intact Intensity: Reduced (see voice)  Effective technique: increased vocal intensity  SOCIAL HISTORY: Occupation: Retired Counsellor intake: suboptimal; 2 bottles Caffeine/alcohol intake: minimal Daily voice use: moderate  PERCEPTUAL VOICE ASSESSMENT: Voice quality: low vocal intensity Vocal abuse: NA Resonance:  normal Respiratory function: Normal  OBJECTIVE VOICE ASSESSMENT: Maximum phonation time for sustained ah: 15 s Conversational pitch average: 109 Hz Conversational pitch range: 92-128 Hz Conversational loudness average: 70 dB Conversational loudness range: 59-80 dB S/z ratio: 1.0 (Suggestive of dysfunction >1.0) /s/: 15 s /z/: 15 s  ORAL MOTOR EXAMINATION: Overall status: WFL Comments:   RECOMMENDATIONS FROM OBJECTIVE SWALLOW STUDY (MBSS/FEES):  11/26/24 Clinical Impression Pt demonstrates mild oropharyngeal dysphagia with no vestibular or airway invasion on this study. No evidence of weakness or decreased ROM. Pt does initiate swallow at the pyriform sinuses with liquids despite adequate oral containment. Additionally pt has appearance of osteophyte at C5-7 that restricted passage of bolus through the PES with a pill. Pt able to pass pills with consecutive sips of thin liquids. Suspect there may be instances of solid foods and liquids hesitating at PES due to obstructed opening and mild timing impairment that could elicit coughing at times. Pills in puree with be helpful. Oral hold and slow rate with thins also helpful. Pt recommended to f/u with OP SLP to reinforce results and strategies.   Swallow Evaluation Recommendations Recommendations: PO diet PO Diet Recommendation: Regular;Thin liquids (Level 0) Liquid Administration via: Cup;Straw Medication Administration: Whole meds with puree Supervision: Patient able to self-feed Swallowing strategies  : Slow rate;Small bites/sips (oral hold)   CLINICAL SWALLOW ASSESSMENT:   Current diet: regular and thin liquids Dentition: adequate natural dentition Patient directly observed with POs: Yes: thin liquids  Feeding: able to feed self Liquids provided by: bottle ([facility] Swallow Protocol)  Oral phase signs and symptoms: None noted today Pharyngeal phase signs and symptoms: None noted today Comments: plans for MBS on Jan 15 due to  reported symptoms  STANDARDIZED ASSESSMENTS: SLUMS: 26/30  PATIENT REPORTED OUTCOME MEASURES (PROM): EAT-10: 5/40  VHI-10: 7/40                                                                                                                            TREATMENT DATE:   12/04/24: SWALLOW: Pt with questions about his MBS related to rationale for oral hold with liquids. SLP explained/educated pt about why oral hold is beneficial. SLP assisted pt in practicing oral hold with water. SLP demonstrated and pt was successful with oral hold. 10 minutes later SLP asked pt to demonstrate oral hold and he did so tww times out of two.  SPEECH: Pt expressed concern about memory - forgetting why he called his wife, or why he walked into the next room, or while he is looking for his phone to send his wife a text. SLP made suggestions about this so that this  occurs less or not at all.   11/24/23: Pt was seen for skilled ST services. Pt showed up for session at the wrong time today. He required additional explanation regarding calendar and had difficulty keeping dates straight when discussing his anniversary trip. SLP assisted pt in organizing and clarifying calendar re: highlighting, writing in days of the week for points of reference. SLP educated on alarms and assisted pt in setting alarms for his medications (8:30, 1:30, 6:30, 8:30). Pt shares that he will snooze and forget to take his medications sometimes. Recommend continued follow up on memory strategies. Pt to follow up with [facility]LP at next visit. His MBS is on 1/15. No overt s/sx of aspiration noted with water in today's session.   11/19/23: Pt was seen for skilled ST services. Pt called wife in today's session for further discusssion with therapist regarding perceived deficits at home. Wife reports some mild difficulty with memory (the little things) and a softer voice; however, she reports she can hear him fine with/without noise. SLP facilitated pt  being scheduled over at [facility] for MBS/tx follow up. At this time, pt does not care to work on voice exercises. SLP initiated education on external memory strategies re: calendar routine, writing information down (notebook or notes app) and lists. To cont next session.   11/11/24: **insurance changing to [employer] 1/1 - Cont same POC. Pt was seen for skilled ST services targeting continued assessment and dysphagia follow up. He reports he is very cognizant of his swallow to not choke.  He reports he over chews his food and eats at a slower rate. He has to make sure his pills are facing straight in his mouth to facilitate easier transition. SLP suggested trying pills in puree to see if this is more comfortable and reduces symptoms. SLP educated on Think Loud and Speak Out/with Intent for Parkinsons and directed him to [email or web address] for further information on sx associated with PD.    **[facility] is closest - if wanting to transition due to SLP maternity leave.   PATIENT EDUCATION: Education details: PD and related changes Person educated: Patient Education method: Explanation Education comprehension: needs further education   GOALS: Goals reviewed with patient? No  SHORT TERM GOALS: Target date: 12/05/23  Pt will complete voice assessment and goals will be updated per patient's wishes.  Baseline: Goal status: MET  2.  Pt will participate in MBS for further insight into swallowing deficits and goals will be updated to include exercises as warranted.  Baseline:  Goal status: INITIAL  3.  Pt will participate in SPEAK OUT! Or LSVT Loud to improve vocal volume.  Baseline:  Goal status: INITIAL    LONG TERM GOALS: Target date: 01/05/24  Pt will improve score on PROM Baseline:  Goal status: INITIAL  2.  Pt will report use of external memory strategies  Baseline:  Goal status: INITIAL  3.  Pt will demonstrate improved vocal volume .  Baseline: 70 dB Goal status:  INITIAL    ASSESSMENT:  CLINICAL IMPRESSION: Pt is a 69 yo male who presents to ST OP for evaluation for Parkinson's Disease. Pt endorses re: choking with liquids/solids and pills getting stuck and changes in thinking skills (memory). He reports he has always been soft spoken. Pt is responsible for medication management, wife helps with financial management. Pt was assessed using PROMs, SLUMS, and voice measures. SLP observed decreased vocal volume during today's assessment. SLP rec skilled ST services to address cognitive-communication, dysarthria and dysphagia.  OBJECTIVE IMPAIRMENTS: include memory, dysarthria, and dysphagia. These impairments are limiting patient from managing medications, managing appointments, managing finances, household responsibilities, effectively communicating at home and in community, and safety when swallowing. Factors affecting potential to achieve goals and functional outcome are NA. Patient will benefit from skilled SLP services to address above impairments and improve overall function.  REHAB POTENTIAL: Good  PLAN:  SLP FREQUENCY: 1x/week  SLP DURATION: 8 weeks  PLANNED INTERVENTIONS: Aspiration precaution training, Pharyngeal strengthening exercises, Diet toleration management , Environmental controls, Cueing hierachy, SLP instruction and feedback, Compensatory strategies, Patient/family education, [phone] Treatment of speech (30 or 45 min) , and [id number] Treatment of swallowing function    Tyson Masin, CCC-SLP 12/04/2024, 9:13 AM           "

## 2024-12-07 ENCOUNTER — Ambulatory Visit: Admitting: Physical Therapy

## 2024-12-07 ENCOUNTER — Ambulatory Visit: Admitting: Occupational Therapy

## 2024-12-21 ENCOUNTER — Ambulatory Visit: Admitting: Occupational Therapy

## 2024-12-21 ENCOUNTER — Ambulatory Visit: Admitting: Physical Therapy

## 2025-01-11 ENCOUNTER — Ambulatory Visit

## 2025-01-13 ENCOUNTER — Ambulatory Visit: Admitting: Podiatry
